# Patient Record
Sex: Female | Born: 1955
Health system: Southern US, Community
[De-identification: ages and names within clinical notes are randomized; demographics above are authoritative.]

## PROBLEM LIST (undated history)

## (undated) DIAGNOSIS — T7840XA Allergy, unspecified, initial encounter: Secondary | ICD-10-CM

## (undated) DIAGNOSIS — K219 Gastro-esophageal reflux disease without esophagitis: Secondary | ICD-10-CM

## (undated) DIAGNOSIS — I10 Essential (primary) hypertension: Secondary | ICD-10-CM

## (undated) DIAGNOSIS — G709 Myoneural disorder, unspecified: Secondary | ICD-10-CM

## (undated) DIAGNOSIS — N952 Postmenopausal atrophic vaginitis: Secondary | ICD-10-CM

## (undated) DIAGNOSIS — M1990 Unspecified osteoarthritis, unspecified site: Secondary | ICD-10-CM

## (undated) DIAGNOSIS — E7800 Pure hypercholesterolemia, unspecified: Secondary | ICD-10-CM

## (undated) HISTORY — DX: Myoneural disorder, unspecified: G70.9

## (undated) HISTORY — PX: COLONOSCOPY: SHX174

## (undated) HISTORY — DX: Pure hypercholesterolemia, unspecified: E78.00

## (undated) HISTORY — DX: Postmenopausal atrophic vaginitis: N95.2

## (undated) HISTORY — DX: Allergy, unspecified, initial encounter: T78.40XA

## (undated) HISTORY — DX: Gastro-esophageal reflux disease without esophagitis: K21.9

## (undated) HISTORY — DX: Unspecified osteoarthritis, unspecified site: M19.90

## (undated) HISTORY — PX: REFRACTIVE SURGERY: SHX103

## (undated) HISTORY — PX: TUBAL LIGATION: SHX77

## (undated) HISTORY — DX: Essential (primary) hypertension: I10

---

## 1990-05-29 HISTORY — PX: OVARIAN CYST SURGERY: SHX726

## 2001-04-12 ENCOUNTER — Other Ambulatory Visit: Admission: RE | Admit: 2001-04-12 | Discharge: 2001-04-12 | Payer: Self-pay | Admitting: Obstetrics and Gynecology

## 2002-04-01 ENCOUNTER — Encounter: Admission: RE | Admit: 2002-04-01 | Discharge: 2002-06-30 | Payer: Self-pay | Admitting: Family Medicine

## 2005-01-17 ENCOUNTER — Ambulatory Visit: Payer: Self-pay | Admitting: Family Medicine

## 2005-01-20 ENCOUNTER — Ambulatory Visit: Payer: Self-pay | Admitting: Family Medicine

## 2005-01-23 ENCOUNTER — Ambulatory Visit: Payer: Self-pay | Admitting: Family Medicine

## 2005-01-27 ENCOUNTER — Ambulatory Visit: Payer: Self-pay | Admitting: Family Medicine

## 2005-03-02 ENCOUNTER — Other Ambulatory Visit: Admission: RE | Admit: 2005-03-02 | Discharge: 2005-03-02 | Payer: Self-pay | Admitting: Obstetrics and Gynecology

## 2005-03-07 ENCOUNTER — Ambulatory Visit: Payer: Self-pay | Admitting: Family Medicine

## 2005-03-20 ENCOUNTER — Ambulatory Visit (HOSPITAL_COMMUNITY): Admission: RE | Admit: 2005-03-20 | Discharge: 2005-03-20 | Payer: Self-pay | Admitting: Obstetrics and Gynecology

## 2005-04-17 ENCOUNTER — Encounter: Admission: RE | Admit: 2005-04-17 | Discharge: 2005-04-17 | Payer: Self-pay | Admitting: Obstetrics and Gynecology

## 2005-04-18 ENCOUNTER — Ambulatory Visit: Payer: Self-pay | Admitting: Family Medicine

## 2005-06-17 ENCOUNTER — Encounter: Admission: RE | Admit: 2005-06-17 | Discharge: 2005-06-17 | Payer: Self-pay | Admitting: Obstetrics and Gynecology

## 2005-12-04 ENCOUNTER — Emergency Department (HOSPITAL_COMMUNITY): Admission: EM | Admit: 2005-12-04 | Discharge: 2005-12-04 | Payer: Self-pay | Admitting: Emergency Medicine

## 2005-12-05 ENCOUNTER — Encounter: Payer: Self-pay | Admitting: Vascular Surgery

## 2005-12-05 ENCOUNTER — Ambulatory Visit (HOSPITAL_COMMUNITY): Admission: RE | Admit: 2005-12-05 | Discharge: 2005-12-05 | Payer: Self-pay | Admitting: Emergency Medicine

## 2005-12-11 ENCOUNTER — Ambulatory Visit: Payer: Self-pay | Admitting: Family Medicine

## 2005-12-18 ENCOUNTER — Ambulatory Visit: Payer: Self-pay | Admitting: Family Medicine

## 2006-01-22 ENCOUNTER — Encounter: Admission: RE | Admit: 2006-01-22 | Discharge: 2006-01-22 | Payer: Self-pay | Admitting: Obstetrics and Gynecology

## 2006-01-26 ENCOUNTER — Ambulatory Visit: Payer: Self-pay | Admitting: Cardiology

## 2006-05-02 ENCOUNTER — Inpatient Hospital Stay: Payer: Self-pay | Admitting: Internal Medicine

## 2006-05-25 ENCOUNTER — Ambulatory Visit: Payer: Self-pay | Admitting: Family Medicine

## 2006-07-31 ENCOUNTER — Other Ambulatory Visit: Admission: RE | Admit: 2006-07-31 | Discharge: 2006-07-31 | Payer: Self-pay | Admitting: Obstetrics and Gynecology

## 2006-08-15 ENCOUNTER — Emergency Department (HOSPITAL_COMMUNITY): Admission: EM | Admit: 2006-08-15 | Discharge: 2006-08-15 | Payer: Self-pay | Admitting: Emergency Medicine

## 2006-08-23 ENCOUNTER — Ambulatory Visit: Payer: Self-pay | Admitting: Family Medicine

## 2006-08-31 ENCOUNTER — Encounter: Admission: RE | Admit: 2006-08-31 | Discharge: 2006-08-31 | Payer: Self-pay | Admitting: Obstetrics and Gynecology

## 2007-03-30 ENCOUNTER — Emergency Department: Payer: Self-pay | Admitting: Emergency Medicine

## 2008-03-14 ENCOUNTER — Emergency Department: Payer: Self-pay | Admitting: Unknown Physician Specialty

## 2008-03-28 ENCOUNTER — Emergency Department (HOSPITAL_COMMUNITY): Admission: EM | Admit: 2008-03-28 | Discharge: 2008-03-28 | Payer: Self-pay | Admitting: Family Medicine

## 2008-06-27 ENCOUNTER — Emergency Department: Payer: Self-pay | Admitting: Emergency Medicine

## 2008-07-13 ENCOUNTER — Emergency Department: Payer: Self-pay | Admitting: Emergency Medicine

## 2008-07-14 ENCOUNTER — Emergency Department: Payer: Self-pay | Admitting: Emergency Medicine

## 2008-08-13 ENCOUNTER — Encounter: Payer: Self-pay | Admitting: Obstetrics and Gynecology

## 2008-08-13 ENCOUNTER — Ambulatory Visit: Payer: Self-pay | Admitting: Obstetrics and Gynecology

## 2008-08-13 ENCOUNTER — Other Ambulatory Visit: Admission: RE | Admit: 2008-08-13 | Discharge: 2008-08-13 | Payer: Self-pay | Admitting: Obstetrics and Gynecology

## 2008-08-18 ENCOUNTER — Ambulatory Visit: Payer: Self-pay | Admitting: Obstetrics and Gynecology

## 2008-12-30 ENCOUNTER — Ambulatory Visit: Payer: Self-pay | Admitting: Obstetrics and Gynecology

## 2009-01-06 ENCOUNTER — Encounter: Admission: RE | Admit: 2009-01-06 | Discharge: 2009-01-06 | Payer: Self-pay | Admitting: Obstetrics and Gynecology

## 2009-09-02 ENCOUNTER — Ambulatory Visit: Payer: Self-pay | Admitting: Obstetrics and Gynecology

## 2009-09-02 ENCOUNTER — Other Ambulatory Visit: Admission: RE | Admit: 2009-09-02 | Discharge: 2009-09-02 | Payer: Self-pay | Admitting: Obstetrics and Gynecology

## 2010-02-04 ENCOUNTER — Encounter: Admission: RE | Admit: 2010-02-04 | Discharge: 2010-02-04 | Payer: Self-pay | Admitting: Obstetrics and Gynecology

## 2010-02-16 ENCOUNTER — Ambulatory Visit: Payer: Self-pay | Admitting: Obstetrics and Gynecology

## 2010-02-22 ENCOUNTER — Encounter: Admission: RE | Admit: 2010-02-22 | Discharge: 2010-02-22 | Payer: Self-pay | Admitting: Obstetrics and Gynecology

## 2010-06-18 ENCOUNTER — Encounter: Payer: Self-pay | Admitting: Obstetrics and Gynecology

## 2010-06-19 ENCOUNTER — Encounter: Payer: Self-pay | Admitting: Obstetrics and Gynecology

## 2010-09-09 ENCOUNTER — Ambulatory Visit (INDEPENDENT_AMBULATORY_CARE_PROVIDER_SITE_OTHER): Payer: Federal, State, Local not specified - PPO | Admitting: Obstetrics and Gynecology

## 2010-09-09 DIAGNOSIS — N952 Postmenopausal atrophic vaginitis: Secondary | ICD-10-CM

## 2010-09-09 DIAGNOSIS — B373 Candidiasis of vulva and vagina: Secondary | ICD-10-CM

## 2010-09-09 DIAGNOSIS — B3731 Acute candidiasis of vulva and vagina: Secondary | ICD-10-CM

## 2010-10-06 ENCOUNTER — Encounter: Payer: Federal, State, Local not specified - PPO | Admitting: Obstetrics and Gynecology

## 2010-10-14 NOTE — Progress Notes (Signed)
[facility]                          PERIPHERAL VASCULAR OFFICE NOTE   Sylvia, Kelly                         MRN:          [id number]  DATE:01/26/2006                            DOB:          03-22-1956    REFERRING PHYSICIAN:  Arta Silence, M.D./Billie D. Bean, FNP.   REASON FOR CONSULTATION:  Longstanding left leg edema.   HISTORY OF PRESENT ILLNESS:  Sylvia Kelly is a overweight 55 year old woman  with diabetes mellitus complicated by peripheral neuropathy.  She has had  longstanding intermittent edema of her left leg.  This dates back at least  to 2005.  Then she had an ultrasound interpreted by Dr. Aleen Campi to show no  evidence of deep venous thrombosis.  No comment was made on the venous  incompetence.  She had another ultrasound on July 10 of this year done at  [facility]  which again showed no evidence of venous thrombosis but  no comment was made on venous competence. She has been treated with Lasix  with marked improvement in her edema.  She has never had any swelling in her  right leg.  She has had longstanding varicosities primarily of her bilateral  thighs which appeared after giving birth to her son.   PAST MEDICAL HISTORY:  1. Status post ovarian cystectomy that turned out to be benign.  2. Diabetes mellitus, complicated by peripheral neuropathy.  3. History of depression.  4. Euthyroid goiter.  5. Hypercholesterolemia.   ALLERGIES:  None known.   CURRENT MEDICATIONS:  1. Actos 45 mg per day.  2. Lantus insulin 70 units per day.  3. Glucophage 1000 mg per day.  4. Calcium + D.  5. Lipitor 40 mg per day.  6. Zetia 10 mg per day.  7. Lasix 20 mg per day.  8. Multivitamin, Byetta, 10 mcg twice per day.  9. Humalog sliding scale.  10.Vitamin E.  11.Aspirin 81 mg per day.   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  She enjoys reading. She is widowed  She exercises by  walking 30  minutes at least five days per week.  She has never smoked and denies  alcohol and illicit drug use.  She follows a low-salt diet.   FAMILY HISTORY:  Father died of lung cancer at 32.  Mother died of  myocardial infarction at 79.  Her son was alive and well.   REVIEW OF SYSTEMS:  Remarkable for wearing reading glasses.  Went through  menopause at age 27.  Occasional cramps in the legs and some occasional knee  discomfort with exertion.  She tells me this is minor. Review of systems is  otherwise negative in detail except as above.   REVIEW OF SYSTEMS:  GENERAL:  Denies fever, chills, night sweats.  RESPIRATORY:  Has chronic cough, productive of white sputum.  GI:  Occasional nausea and GERD symptoms.  CARDIAC:  Denies chest pain, PND,  orthopnea, edema.  It is otherwise negative in detail except as above.   GENERAL:  She is generally a well-appearing,  overweight woman in no  distress.  VITAL SIGNS:  Heart rate 78, blood pressure 110/72 on the right and 112/68  on the left. She is 5 feet 4 inches tall and weighs 203 pounds.  BMI is  34.9.  HEENT:  Normal.  SKIN:  Normal with the exception of small venous varicosities primarily on  the thighs with some associated spiders.  NECK:  No jugular venous distension or lymphadenopathy.  She does have  prominent goiter without focal mass.  Respiratory effort is normal.  LUNGS:  Clear to auscultation.  CARDIOVASCULAR:  She has a nonpalpable point of maximal cardiac impulse.  Regular rate and rhythm without murmur, rub or gallop.  ABDOMEN:  Soft, nondistended, nontender.  There is no hepatosplenomegaly.  Bowel sounds are normal.  No midline pulsatile mass.  No abdominal bruit.  EXTREMITIES:  Warm without clubbing, cyanosis or edema, or ulceration.  PULSES:  Carotid pulses 2+ bilaterally without bruit.  Radial pulses are 2+  bilaterally.  Femoral pulses 2+ bilaterally without bruit.  The DP and PT  pulses 2+ bilaterally.   NEUROLOGIC: She was alert and oriented x3 with normal neurologic examination  with the exception of decreased sensation in her feet.   LABORATORY STUDIES:  Laboratory studies dated May 2007 remarkable for  creatinine 0.7, sodium 137, albumin 4.2, AST 20, ALT 20, LDL 72, HDL 81.  Hemoglobin A1C 7.6.  Additional laboratory studies dated December 2006  remarkable for TSH 0.6 and free T4 of 0.96 (both within normal limits).  Urinalysis showed no significant albuminuria.   IMPRESSION/RECOMMENDATION:  Sylvia Kelly has intermittent left leg edema which  is currently absent.  She thinks the Lasix has been helpful with this.  She  has no skin changes of chronic venous stasis.  Neither of her ultrasounds  have commented on venous incompetence.  I think this is the most likely  etiology.  We will, therefore, repeat an ultrasound specifically to look for  venous incompetence.  In the interim, would continue her on Lasix which  seems to be helping.  I have told her when her edema recurs, she should  purchase compression stockings  and wear these on a regular basis.  I applauded on her exercise program as  this will clearly be important over the long hall.                                   Salvadore Farber, MD   WED/MedQ  DD:  01/26/2006  DT:  01/27/2006  Job #:  [id number]

## 2010-10-26 ENCOUNTER — Encounter: Payer: Federal, State, Local not specified - PPO | Admitting: Obstetrics and Gynecology

## 2010-11-09 ENCOUNTER — Encounter (INDEPENDENT_AMBULATORY_CARE_PROVIDER_SITE_OTHER): Payer: Federal, State, Local not specified - PPO | Admitting: Obstetrics and Gynecology

## 2010-11-09 ENCOUNTER — Other Ambulatory Visit (HOSPITAL_COMMUNITY)
Admission: RE | Admit: 2010-11-09 | Discharge: 2010-11-09 | Disposition: A | Discharge status: Home / Self Care | Payer: Federal, State, Local not specified - PPO | Source: Ambulatory Visit | Attending: Obstetrics and Gynecology | Admitting: Obstetrics and Gynecology

## 2010-11-09 ENCOUNTER — Other Ambulatory Visit: Payer: Self-pay | Admitting: Obstetrics and Gynecology

## 2010-11-09 DIAGNOSIS — Z124 Encounter for screening for malignant neoplasm of cervix: Secondary | ICD-10-CM | POA: Insufficient documentation

## 2010-11-09 DIAGNOSIS — Z01419 Encounter for gynecological examination (general) (routine) without abnormal findings: Secondary | ICD-10-CM

## 2011-02-26 ENCOUNTER — Other Ambulatory Visit: Payer: Self-pay | Admitting: Obstetrics and Gynecology

## 2011-02-28 ENCOUNTER — Encounter: Payer: Self-pay | Admitting: Gynecology

## 2011-02-28 DIAGNOSIS — I10 Essential (primary) hypertension: Secondary | ICD-10-CM | POA: Insufficient documentation

## 2011-02-28 DIAGNOSIS — E7800 Pure hypercholesterolemia, unspecified: Secondary | ICD-10-CM | POA: Insufficient documentation

## 2011-02-28 DIAGNOSIS — E1140 Type 2 diabetes mellitus with diabetic neuropathy, unspecified: Secondary | ICD-10-CM | POA: Insufficient documentation

## 2011-02-28 DIAGNOSIS — N952 Postmenopausal atrophic vaginitis: Secondary | ICD-10-CM | POA: Insufficient documentation

## 2011-03-01 ENCOUNTER — Ambulatory Visit (INDEPENDENT_AMBULATORY_CARE_PROVIDER_SITE_OTHER): Payer: Federal, State, Local not specified - PPO | Admitting: Obstetrics and Gynecology

## 2011-03-01 DIAGNOSIS — N952 Postmenopausal atrophic vaginitis: Secondary | ICD-10-CM

## 2011-03-01 NOTE — Progress Notes (Signed)
Patient came to see me today regarding her vaginal ring. She is very happy with it. It works very well. The only issue is that she can't remove it and its time for to be removed.  Physical exam: The vaginal ring is properly placed. The patient has a small cystocele which I believe makes it hard for her to remove it.  Assessment: Atrophic vaginitis  Plan: Vaginal ring removed with ease. Patient will insert another one herself. She will look for a diaphragm inserter at the drug store. If she cannot find 1 she'll return in 3 months for me to remove her ring again.

## 2011-06-20 ENCOUNTER — Ambulatory Visit (INDEPENDENT_AMBULATORY_CARE_PROVIDER_SITE_OTHER): Payer: Federal, State, Local not specified - PPO | Admitting: Obstetrics and Gynecology

## 2011-06-20 DIAGNOSIS — N952 Postmenopausal atrophic vaginitis: Secondary | ICD-10-CM

## 2011-06-20 MED ORDER — ESTRADIOL 2 MG VA RING: 2.0000 mg | VAGINAL_RING | VAGINAL | Status: DC

## 2011-06-20 NOTE — Progress Notes (Signed)
Patient came to see me today because she cannot get her Estring vaginal ring out. She is very happy with it and this is her second one. She is due for a mammogram.  Exam: Kennon Portela present. Estring vaginal ring removed.  Assessment: Atrophic vaginitis  Plan: Patient instructed how to remove it. She has no refills so we called them in. Patient to schedule mammogram. Return in June for yearly visit.

## 2011-06-26 ENCOUNTER — Other Ambulatory Visit: Payer: Self-pay | Admitting: Obstetrics and Gynecology

## 2011-06-26 DIAGNOSIS — Z1231 Encounter for screening mammogram for malignant neoplasm of breast: Secondary | ICD-10-CM

## 2011-07-06 ENCOUNTER — Ambulatory Visit
Admission: RE | Admit: 2011-07-06 | Discharge: 2011-07-06 | Disposition: A | Discharge status: Home / Self Care | Payer: Federal, State, Local not specified - PPO | Source: Ambulatory Visit | Attending: Obstetrics and Gynecology | Admitting: Obstetrics and Gynecology

## 2011-07-06 DIAGNOSIS — Z1231 Encounter for screening mammogram for malignant neoplasm of breast: Secondary | ICD-10-CM

## 2011-10-17 ENCOUNTER — Ambulatory Visit: Payer: Federal, State, Local not specified - PPO | Admitting: Obstetrics and Gynecology

## 2011-10-17 ENCOUNTER — Ambulatory Visit (INDEPENDENT_AMBULATORY_CARE_PROVIDER_SITE_OTHER): Payer: Federal, State, Local not specified - PPO | Admitting: Obstetrics and Gynecology

## 2011-10-17 DIAGNOSIS — N952 Postmenopausal atrophic vaginitis: Secondary | ICD-10-CM

## 2011-10-17 NOTE — Progress Notes (Signed)
Patient came to see me today because she cannot remove her Estring. She is very happy with that and it has terminated the dryness with intercourse. She has a new ring at home and she has no trouble placing it.   Exam: Kennon Portela present. Estring removed without difficulty. Patient has excellent estrogen effect.

## 2011-11-10 ENCOUNTER — Encounter: Payer: Self-pay | Admitting: Obstetrics and Gynecology

## 2011-11-10 ENCOUNTER — Ambulatory Visit (INDEPENDENT_AMBULATORY_CARE_PROVIDER_SITE_OTHER): Payer: Federal, State, Local not specified - PPO | Admitting: Obstetrics and Gynecology

## 2011-11-10 VITALS — BP 130/78 | Ht 64.0 in | Wt 189.0 lb

## 2011-11-10 DIAGNOSIS — N899 Noninflammatory disorder of vagina, unspecified: Secondary | ICD-10-CM

## 2011-11-10 DIAGNOSIS — N898 Other specified noninflammatory disorders of vagina: Secondary | ICD-10-CM

## 2011-11-10 DIAGNOSIS — Z01419 Encounter for gynecological examination (general) (routine) without abnormal findings: Secondary | ICD-10-CM

## 2011-11-10 LAB — WET PREP FOR TRICH, YEAST, CLUE
Trich, Wet Prep: NONE SEEN
WBC, Wet Prep HPF POC: NONE SEEN

## 2011-11-10 MED ORDER — TERCONAZOLE 0.8 % VA CREA: 1.0000 | TOPICAL_CREAM | Freq: Every day | VAGINAL | Status: AC

## 2011-11-10 MED ORDER — ESTRADIOL 2 MG VA RING: 2.0000 mg | VAGINAL_RING | VAGINAL | Status: DC

## 2011-11-10 NOTE — Progress Notes (Signed)
Patient came to see me today for her annual GYN exam. She does well without HRT. She does use  Estring for atrophic vaginitis with excellent results. She is having no vaginal bleeding. She is having no pelvic pain. She has a 2 day history of vulvar and vaginal irritation and discharge. She's been treated for diabetes which based on what she said to me does not appear to be well-controlled. She is up-to-date on mammograms. She has had 2 normal bone densities. She has always had normal Pap smears and her last 1 was 2012. She has noticed some swelling in her left leg. She is on blood pressure medication which includes a diuretic.  Physical examination: Kennon Portela present. HEENT within normal limits. Neck: Thyroid not large. No masses. Supraclavicular nodes: not enlarged. Breasts: Examined in both sitting and lying  position. No skin changes and no masses. Abdomen: Soft no guarding rebound or masses or hernia. Pelvic: External: Within normal limits. BUS: Within normal limits. Vaginal:within normal limits.  Wet prep positive for yeast. Good estrogen effect. No evidence of cystocele rectocele or enterocele. Cervix: clean. Uterus: Normal size and shape. Adnexa: No masses. Rectovaginal exam: Confirmatory and negative. Extremities: Within normal limits except for 2+ edema of left foot.  Assessment: #1. Atrophic vaginitis #2. Yeast vaginitis #3. Unilateral swelling of the leg  Plan: Continue yearly mammograms. Continue estring. Terconazole 3 cream. Due to her antihypertensive drug I have suggested she discuss her leg swelling with her PCP. She will also try to control her diabetes better.

## 2011-11-11 LAB — URINALYSIS W MICROSCOPIC + REFLEX CULTURE
Bacteria, UA: NONE SEEN
Bilirubin Urine: NEGATIVE
Casts: NONE SEEN
Crystals: NONE SEEN
Glucose, UA: 100 mg/dL — AB
Hgb urine dipstick: NEGATIVE
Ketones, ur: NEGATIVE mg/dL
Leukocytes, UA: NEGATIVE
Nitrite: NEGATIVE
Protein, ur: NEGATIVE mg/dL
Specific Gravity, Urine: 1.012 (ref 1.005–1.030)
Squamous Epithelial / HPF: NONE SEEN
Urobilinogen, UA: 0.2 mg/dL (ref 0.0–1.0)
pH: 6.5 (ref 5.0–8.0)

## 2011-11-17 ENCOUNTER — Encounter: Payer: Self-pay | Admitting: Obstetrics and Gynecology

## 2012-04-04 ENCOUNTER — Ambulatory Visit (INDEPENDENT_AMBULATORY_CARE_PROVIDER_SITE_OTHER): Payer: Federal, State, Local not specified - PPO | Admitting: Gynecology

## 2012-04-04 ENCOUNTER — Encounter: Payer: Self-pay | Admitting: Gynecology

## 2012-04-04 DIAGNOSIS — N952 Postmenopausal atrophic vaginitis: Secondary | ICD-10-CM

## 2012-04-04 DIAGNOSIS — Z23 Encounter for immunization: Secondary | ICD-10-CM

## 2012-04-04 NOTE — Progress Notes (Signed)
Patient presents to have her Estring removed. Is being treated for atrophic vaginitis but Dr. Eda Paschal doing well with this. She cannot remove it herself but is able to replace it.  Exam was kim assistant External BUS vagina grossly normal. Estring removed and discarded. Cervix normal. Uterus grossly normal midline mobile nontender. Adnexa without masses or tenderness.  Assessment and plan: Atrophic vaginitis with good response to Estring. She'll continue in follow up in June 2014 when she is due for her annual. No bleeding or other complaints. She was again reminded to report any vaginal bleeding.

## 2012-04-04 NOTE — Patient Instructions (Signed)
Replace Estring Follow up June 2014 for annual exam

## 2012-04-04 NOTE — Addendum Note (Signed)
Addended by: Dayna Barker on: 04/04/2012 10:51 AM   Modules accepted: Orders

## 2012-07-29 ENCOUNTER — Other Ambulatory Visit: Payer: Self-pay

## 2012-07-29 MED ORDER — ESTRADIOL 2 MG VA RING: 2.0000 mg | VAGINAL_RING | VAGINAL | Status: DC

## 2012-09-24 ENCOUNTER — Ambulatory Visit (INDEPENDENT_AMBULATORY_CARE_PROVIDER_SITE_OTHER): Payer: Federal, State, Local not specified - PPO | Admitting: Gynecology

## 2012-09-24 ENCOUNTER — Encounter: Payer: Self-pay | Admitting: Gynecology

## 2012-09-24 DIAGNOSIS — N952 Postmenopausal atrophic vaginitis: Secondary | ICD-10-CM

## 2012-09-24 MED ORDER — OSPEMIFENE 60 MG PO TABS: 60.0000 mg | ORAL_TABLET | Freq: Every day | ORAL | Status: DC

## 2012-09-24 NOTE — Progress Notes (Signed)
Patient presents to have her Estring removed. She can replace it herself but has difficulty removing it. She had questions about Osphena.  Exam with Selena Batten assistant External BUS vagina normal with mild atrophic changes. Cervix normal. Uterus normal size midline mobile nontender. Adnexa without masses or tenderness.  Assessment and plan: Atrophic vaginitis. Patient is symptomatic with vaginal dryness and dyspareunia. No significant other menopausal symptoms such as hot flushes or night sweats. Options for management include OTC Rx, oral or transdermal HRT, estrogen vaginal cream, Vagifem, continuing Estring or Osphena. I reviewed the weight Osphena works in the issues to include bone breast coagulation endometrial stimulation issues. Relatively new drug and unknown long-term risks reviewed. Patient wants to try it I gave her 2 week sample and the starter kit with discount card. Patient will try and if she likes this will continue. She says she due to see me a month for her annual we'll rediscuss at that time. If she wants to go back on Estring she'll call we can refill this also.

## 2012-09-24 NOTE — Patient Instructions (Signed)
Try Osphena as we discussed. Call if any questions. Followup for your annual exam as scheduled.

## 2012-11-11 ENCOUNTER — Encounter: Payer: Federal, State, Local not specified - PPO | Admitting: Gynecology

## 2012-12-05 ENCOUNTER — Encounter: Payer: Self-pay | Admitting: Gynecology

## 2012-12-25 ENCOUNTER — Encounter: Payer: Federal, State, Local not specified - PPO | Admitting: Gynecology

## 2013-02-13 ENCOUNTER — Encounter: Payer: Federal, State, Local not specified - PPO | Admitting: Gynecology

## 2013-06-06 ENCOUNTER — Other Ambulatory Visit: Payer: Self-pay | Admitting: Nurse Practitioner

## 2013-06-06 DIAGNOSIS — R519 Headache, unspecified: Secondary | ICD-10-CM

## 2013-06-06 DIAGNOSIS — R51 Headache: Principal | ICD-10-CM

## 2013-06-18 ENCOUNTER — Ambulatory Visit
Admission: RE | Admit: 2013-06-18 | Discharge: 2013-06-18 | Disposition: A | Discharge status: Home / Self Care | Payer: Federal, State, Local not specified - PPO | Source: Ambulatory Visit | Attending: Nurse Practitioner | Admitting: Nurse Practitioner

## 2013-06-18 DIAGNOSIS — R519 Headache, unspecified: Secondary | ICD-10-CM

## 2013-06-18 DIAGNOSIS — R51 Headache: Principal | ICD-10-CM

## 2013-06-21 ENCOUNTER — Ambulatory Visit
Admission: RE | Admit: 2013-06-21 | Discharge: 2013-06-21 | Disposition: A | Discharge status: Home / Self Care | Payer: Federal, State, Local not specified - PPO | Source: Ambulatory Visit | Attending: Nurse Practitioner | Admitting: Nurse Practitioner

## 2013-06-21 MED ORDER — GADOBENATE DIMEGLUMINE 529 MG/ML IV SOLN
16.0000 mL | Freq: Once | INTRAVENOUS | Status: AC | PRN
  Administered 2013-06-21: 16 mL via INTRAVENOUS

## 2013-06-23 ENCOUNTER — Other Ambulatory Visit: Payer: Federal, State, Local not specified - PPO

## 2013-09-09 ENCOUNTER — Other Ambulatory Visit: Payer: Self-pay

## 2013-09-09 DIAGNOSIS — Z1231 Encounter for screening mammogram for malignant neoplasm of breast: Secondary | ICD-10-CM

## 2013-09-26 ENCOUNTER — Encounter (INDEPENDENT_AMBULATORY_CARE_PROVIDER_SITE_OTHER): Payer: Self-pay

## 2013-09-26 ENCOUNTER — Ambulatory Visit
Admission: RE | Admit: 2013-09-26 | Discharge: 2013-09-26 | Disposition: A | Discharge status: Home / Self Care | Payer: Federal, State, Local not specified - PPO | Source: Ambulatory Visit

## 2013-09-26 DIAGNOSIS — Z1231 Encounter for screening mammogram for malignant neoplasm of breast: Secondary | ICD-10-CM

## 2013-09-29 ENCOUNTER — Other Ambulatory Visit: Payer: Self-pay | Admitting: Internal Medicine

## 2013-09-29 DIAGNOSIS — R928 Other abnormal and inconclusive findings on diagnostic imaging of breast: Secondary | ICD-10-CM

## 2013-10-08 ENCOUNTER — Ambulatory Visit
Admission: RE | Admit: 2013-10-08 | Discharge: 2013-10-08 | Disposition: A | Discharge status: Home / Self Care | Payer: Federal, State, Local not specified - PPO | Source: Ambulatory Visit | Attending: Internal Medicine | Admitting: Internal Medicine

## 2013-10-08 DIAGNOSIS — R928 Other abnormal and inconclusive findings on diagnostic imaging of breast: Secondary | ICD-10-CM

## 2014-03-12 ENCOUNTER — Other Ambulatory Visit: Payer: Self-pay | Admitting: Internal Medicine

## 2014-03-12 DIAGNOSIS — R921 Mammographic calcification found on diagnostic imaging of breast: Secondary | ICD-10-CM

## 2014-03-30 ENCOUNTER — Encounter: Payer: Self-pay | Admitting: Gynecology

## 2014-04-14 ENCOUNTER — Ambulatory Visit
Admission: RE | Admit: 2014-04-14 | Discharge: 2014-04-14 | Disposition: A | Discharge status: Home / Self Care | Payer: Federal, State, Local not specified - PPO | Source: Ambulatory Visit | Attending: Internal Medicine | Admitting: Internal Medicine

## 2014-04-14 DIAGNOSIS — R921 Mammographic calcification found on diagnostic imaging of breast: Secondary | ICD-10-CM

## 2014-08-17 ENCOUNTER — Encounter: Payer: Federal, State, Local not specified - PPO | Admitting: Gynecology

## 2014-09-09 ENCOUNTER — Other Ambulatory Visit: Payer: Self-pay | Admitting: Internal Medicine

## 2014-09-09 DIAGNOSIS — R921 Mammographic calcification found on diagnostic imaging of breast: Secondary | ICD-10-CM

## 2015-04-10 ENCOUNTER — Encounter (HOSPITAL_COMMUNITY): Payer: Self-pay | Admitting: *Deleted

## 2015-04-10 ENCOUNTER — Emergency Department (HOSPITAL_COMMUNITY)
Admission: EM | Admit: 2015-04-10 | Discharge: 2015-04-10 | Disposition: A | Discharge status: Home / Self Care | Payer: Federal, State, Local not specified - PPO | Attending: Emergency Medicine | Admitting: Emergency Medicine

## 2015-04-10 DIAGNOSIS — S199XXA Unspecified injury of neck, initial encounter: Secondary | ICD-10-CM | POA: Diagnosis present

## 2015-04-10 DIAGNOSIS — Y92410 Unspecified street and highway as the place of occurrence of the external cause: Secondary | ICD-10-CM | POA: Insufficient documentation

## 2015-04-10 DIAGNOSIS — E119 Type 2 diabetes mellitus without complications: Secondary | ICD-10-CM | POA: Diagnosis not present

## 2015-04-10 DIAGNOSIS — Z7982 Long term (current) use of aspirin: Secondary | ICD-10-CM | POA: Insufficient documentation

## 2015-04-10 DIAGNOSIS — Y9389 Activity, other specified: Secondary | ICD-10-CM | POA: Diagnosis not present

## 2015-04-10 DIAGNOSIS — E7800 Pure hypercholesterolemia, unspecified: Secondary | ICD-10-CM | POA: Insufficient documentation

## 2015-04-10 DIAGNOSIS — Y998 Other external cause status: Secondary | ICD-10-CM | POA: Insufficient documentation

## 2015-04-10 DIAGNOSIS — Z8742 Personal history of other diseases of the female genital tract: Secondary | ICD-10-CM | POA: Diagnosis not present

## 2015-04-10 DIAGNOSIS — S134XXA Sprain of ligaments of cervical spine, initial encounter: Secondary | ICD-10-CM

## 2015-04-10 DIAGNOSIS — Z79899 Other long term (current) drug therapy: Secondary | ICD-10-CM | POA: Insufficient documentation

## 2015-04-10 DIAGNOSIS — Z794 Long term (current) use of insulin: Secondary | ICD-10-CM | POA: Diagnosis not present

## 2015-04-10 DIAGNOSIS — I10 Essential (primary) hypertension: Secondary | ICD-10-CM | POA: Insufficient documentation

## 2015-04-10 MED ORDER — METHOCARBAMOL 500 MG PO TABS: 500.0000 mg | ORAL_TABLET | Freq: Two times a day (BID) | ORAL | Status: DC

## 2015-04-10 MED ORDER — IBUPROFEN 800 MG PO TABS: 800.0000 mg | ORAL_TABLET | Freq: Three times a day (TID) | ORAL | Status: DC
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
## 2015-04-10 NOTE — Discharge Instructions (Signed)
°Cervical Strain and Sprain With Rehab °Cervical strain and sprain are injuries that commonly occur with "whiplash" injuries. Whiplash occurs when the neck is forcefully whipped backward or forward, such as during a motor vehicle accident or during contact sports. The muscles, ligaments, tendons, discs, and nerves of the neck are susceptible to injury when this occurs. °RISK FACTORS °Risk of having a whiplash injury increases if: °· Osteoarthritis of the spine. °· Situations that make head or neck accidents or trauma more likely. °· High-risk sports (football, rugby, wrestling, hockey, auto racing, gymnastics, diving, contact karate, or boxing). °· Poor strength and flexibility of the neck. °· Previous neck injury. °· Poor tackling technique. °· Improperly fitted or padded equipment. °SYMPTOMS  °· Pain or stiffness in the front or back of neck or both. °· Symptoms may present immediately or up to 24 hours after injury. °· Dizziness, headache, nausea, and vomiting. °· Muscle spasm with soreness and stiffness in the neck. °· Tenderness and swelling at the injury site. °PREVENTION °· Learn and use proper technique (avoid tackling with the head, spearing, and head-butting; use proper falling techniques to avoid landing on the head). °· Warm up and stretch properly before activity. °· Maintain physical fitness: °¨ Strength, flexibility, and endurance. °¨ Cardiovascular fitness. °· Wear properly fitted and padded protective equipment, such as padded soft collars, for participation in contact sports. °PROGNOSIS  °Recovery from cervical strain and sprain injuries is dependent on the extent of the injury. These injuries are usually curable in 1 week to 3 months with appropriate treatment.  °RELATED COMPLICATIONS  °· Temporary numbness and weakness may occur if the nerve roots are damaged, and this may persist until the nerve has completely healed. °· Chronic pain due to frequent recurrence of symptoms. °· Prolonged healing,  especially if activity is resumed too soon (before complete recovery). °TREATMENT  °Treatment initially involves the use of ice and medication to help reduce pain and inflammation. It is also important to perform strengthening and stretching exercises and modify activities that worsen symptoms so the injury does not get worse. These exercises may be performed at home or with a therapist. For patients who experience severe symptoms, a soft, padded collar may be recommended to be worn around the neck.  °Improving your posture may help reduce symptoms. Posture improvement includes pulling your chin and abdomen in while sitting or standing. If you are sitting, sit in a firm chair with your buttocks against the back of the chair. While sleeping, try replacing your pillow with a small towel rolled to 2 inches in diameter, or use a cervical pillow or soft cervical collar. Poor sleeping positions delay healing.  °For patients with nerve root damage, which causes numbness or weakness, the use of a cervical traction apparatus may be recommended. Surgery is rarely necessary for these injuries. However, cervical strain and sprains that are present at birth (congenital) may require surgery. °MEDICATION  °· If pain medication is necessary, nonsteroidal anti-inflammatory medications, such as aspirin and ibuprofen, or other minor pain relievers, such as acetaminophen, are often recommended. °· Do not take pain medication for 7 days before surgery. °· Prescription pain relievers may be given if deemed necessary by your caregiver. Use only as directed and only as much as you need. °HEAT AND COLD:  °· Cold treatment (icing) relieves pain and reduces inflammation. Cold treatment should be applied for 10 to 15 minutes every 2 to 3 hours for inflammation and pain and immediately after any activity that aggravates your   HEAT AND COLD:   · Cold treatment (icing) relieves pain and reduces inflammation. Cold treatment should be applied for 10 to 15 minutes every 2 to 3 hours for inflammation and pain and immediately after any activity that aggravates your symptoms. Use ice packs or an ice massage.  · Heat treatment may be used prior to performing the stretching and  strengthening activities prescribed by your caregiver, physical therapist, or athletic trainer. Use a heat pack or a warm soak.  SEEK MEDICAL CARE IF:   · Symptoms get worse or do not improve in 2 weeks despite treatment.  · New, unexplained symptoms develop (drugs used in treatment may produce side effects).  EXERCISES  RANGE OF MOTION (ROM) AND STRETCHING EXERCISES - Cervical Strain and Sprain  These exercises may help you when beginning to rehabilitate your injury. In order to successfully resolve your symptoms, you must improve your posture. These exercises are designed to help reduce the forward-head and rounded-shoulder posture which contributes to this condition. Your symptoms may resolve with or without further involvement from your physician, physical therapist or athletic trainer. While completing these exercises, remember:   · Restoring tissue flexibility helps normal motion to return to the joints. This allows healthier, less painful movement and activity.  · An effective stretch should be held for at least 20 seconds, although you may need to begin with shorter hold times for comfort.  · A stretch should never be painful. You should only feel a gentle lengthening or release in the stretched tissue.  STRETCH- Axial Extensors  · Lie on your back on the floor. You may bend your knees for comfort. Place a rolled-up hand towel or dish towel, about 2 inches in diameter, under the part of your head that makes contact with the floor.  · Gently tuck your chin, as if trying to make a "double chin," until you feel a gentle stretch at the base of your head.  · Hold __________ seconds.  Repeat __________ times. Complete this exercise __________ times per day.   STRETCH - Axial Extension   · Stand or sit on a firm surface. Assume a good posture: chest up, shoulders drawn back, abdominal muscles slightly tense, knees unlocked (if standing) and feet hip width apart.  · Slowly retract your chin so your head slides back  and your chin slightly lowers. Continue to look straight ahead.  · You should feel a gentle stretch in the back of your head. Be certain not to feel an aggressive stretch since this can cause headaches later.  · Hold for __________ seconds.  Repeat __________ times. Complete this exercise __________ times per day.  STRETCH - Cervical Side Bend   · Stand or sit on a firm surface. Assume a good posture: chest up, shoulders drawn back, abdominal muscles slightly tense, knees unlocked (if standing) and feet hip width apart.  · Without letting your nose or shoulders move, slowly tip your right / left ear to your shoulder until your feel a gentle stretch in the muscles on the opposite side of your neck.  · Hold __________ seconds.  Repeat __________ times. Complete this exercise __________ times per day.  STRETCH - Cervical Rotators   · Stand or sit on a firm surface. Assume a good posture: chest up, shoulders drawn back, abdominal muscles slightly tense, knees unlocked (if standing) and feet hip width apart.  · Keeping your eyes level with the ground, slowly turn your head until you feel a gentle stretch along   the back and opposite side of your neck.  · Hold __________ seconds.  Repeat __________ times. Complete this exercise __________ times per day.  RANGE OF MOTION - Neck Circles   · Stand or sit on a firm surface. Assume a good posture: chest up, shoulders drawn back, abdominal muscles slightly tense, knees unlocked (if standing) and feet hip width apart.  · Gently roll your head down and around from the back of one shoulder to the back of the other. The motion should never be forced or painful.  · Repeat the motion 10-20 times, or until you feel the neck muscles relax and loosen.  Repeat __________ times. Complete the exercise __________ times per day.  STRENGTHENING EXERCISES - Cervical Strain and Sprain  These exercises may help you when beginning to rehabilitate your injury. They may resolve your symptoms with or  without further involvement from your physician, physical therapist, or athletic trainer. While completing these exercises, remember:   · Muscles can gain both the endurance and the strength needed for everyday activities through controlled exercises.  · Complete these exercises as instructed by your physician, physical therapist, or athletic trainer. Progress the resistance and repetitions only as guided.  · You may experience muscle soreness or fatigue, but the pain or discomfort you are trying to eliminate should never worsen during these exercises. If this pain does worsen, stop and make certain you are following the directions exactly. If the pain is still present after adjustments, discontinue the exercise until you can discuss the trouble with your clinician.  STRENGTH - Cervical Flexors, Isometric  · Face a wall, standing about 6 inches away. Place a small pillow, a ball about 6-8 inches in diameter, or a folded towel between your forehead and the wall.  · Slightly tuck your chin and gently push your forehead into the soft object. Push only with mild to moderate intensity, building up tension gradually. Keep your jaw and forehead relaxed.  · Hold 10 to 20 seconds. Keep your breathing relaxed.  · Release the tension slowly. Relax your neck muscles completely before you start the next repetition.  Repeat __________ times. Complete this exercise __________ times per day.  STRENGTH- Cervical Lateral Flexors, Isometric   · Stand about 6 inches away from a wall. Place a small pillow, a ball about 6-8 inches in diameter, or a folded towel between the side of your head and the wall.  · Slightly tuck your chin and gently tilt your head into the soft object. Push only with mild to moderate intensity, building up tension gradually. Keep your jaw and forehead relaxed.  · Hold 10 to 20 seconds. Keep your breathing relaxed.  · Release the tension slowly. Relax your neck muscles completely before you start the next  repetition.  Repeat __________ times. Complete this exercise __________ times per day.  STRENGTH - Cervical Extensors, Isometric   · Stand about 6 inches away from a wall. Place a small pillow, a ball about 6-8 inches in diameter, or a folded towel between the back of your head and the wall.  · Slightly tuck your chin and gently tilt your head back into the soft object. Push only with mild to moderate intensity, building up tension gradually. Keep your jaw and forehead relaxed.  · Hold 10 to 20 seconds. Keep your breathing relaxed.  · Release the tension slowly. Relax your neck muscles completely before you start the next repetition.  Repeat __________ times. Complete this exercise __________ times per day.    All of your joints have less wear and tear when properly supported by a spine with good posture. This means you will experience a healthier, less painful body.  Correct posture must be practiced with all of your activities, especially prolonged sitting and standing. Correct posture is as important when doing repetitive low-stress activities (typing) as it is when doing a single heavy-load activity (lifting). PROLONGED STANDING WHILE SLIGHTLY LEANING FORWARD When completing a task that requires you to lean forward while standing in one  place for a long time, place either foot up on a stationary 2- to 4-inch high object to help maintain the best posture. When both feet are on the ground, the low back tends to lose its slight inward curve. If this curve flattens (or becomes too large), then the back and your other joints will experience too much stress, fatigue more quickly, and can cause pain.  RESTING POSITIONS Consider which positions are most painful for you when choosing a resting position. If you have pain with flexion-based activities (sitting, bending, stooping, squatting), choose a position that allows you to rest in a less flexed posture. You would want to avoid curling into a fetal position on your side. If your pain worsens with extension-based activities (prolonged standing, working overhead), avoid resting in an extended position such as sleeping on your stomach. Most people will find more comfort when they rest with their spine in a more neutral position, neither too rounded nor too arched. Lying on a non-sagging bed on your side with a pillow between your knees, or on your back with a pillow under your knees will often provide some relief. Keep in mind, being in any one position for a prolonged period of time, no matter how correct your posture, can still lead to stiffness. WALKING Walk with an upright posture. Your ears, shoulders, and hips should all line up. OFFICE WORK When working at a desk, create an environment that supports good, upright posture. Without extra support, muscles fatigue and lead to excessive strain on joints and other tissues. CHAIR:  A chair should be able to slide under your desk when your back makes contact with the back of the chair. This allows you to work closely.  The chair's height should allow your eyes to be level with the upper part of your monitor and your hands to be slightly lower than your elbows.  Body position:  Your feet should make contact with the floor. If this is not  possible, use a foot rest.  Keep your ears over your shoulders. This will reduce stress on your neck and low back.   This information is not intended to replace advice given to you by your health care provider. Make sure you discuss any questions you have with your health care provider.   Document Released: 05/15/2005 Document Revised: 06/05/2014 Document Reviewed: 08/27/2008 Elsevier Interactive Patient Education 2016 [employer].  Technical brewer After a car crash (motor vehicle collision), it is normal to have bruises and sore muscles. The first 24 hours usually feel the worst. After that, you will likely start to feel better each day. HOME CARE  Put ice on the injured area.  Put ice in a plastic bag.  Place a towel between your skin and the bag.  Leave the ice on for 15-20 minutes, 03-04 times a day.  Drink enough fluids to keep your pee (urine) clear or pale yellow.  Do not drink alcohol.  Take a warm shower or bath 1 or  2 times a day. This helps your sore muscles.  Return to activities as told by your doctor. Be careful when lifting. Lifting can make neck or back pain worse.  Only take medicine as told by your doctor. Do not use aspirin. GET HELP RIGHT AWAY IF:   Your arms or legs tingle, feel weak, or lose feeling (numbness).  You have headaches that do not get better with medicine.  You have neck pain, especially in the middle of the back of your neck.  You cannot control when you pee (urinate) or poop (bowel movement).  Pain is getting worse in any part of your body.  You are short of breath, dizzy, or pass out (faint).  You have chest pain.  You feel sick to your stomach (nauseous), throw up (vomit), or sweat.  You have belly (abdominal) pain that gets worse.  There is blood in your pee, poop, or throw up.  You have pain in your shoulder (shoulder strap areas).  Your problems are getting worse. MAKE SURE YOU:   Understand these  instructions.  Will watch your condition.  Will get help right away if you are not doing well or get worse.   This information is not intended to replace advice given to you by your health care provider. Make sure you discuss any questions you have with your health care provider.   Document Released: 11/01/2007 Document Revised: 08/07/2011 Document Reviewed: 10/12/2010 Elsevier Interactive Patient Education [employer].

## 2015-08-17 ENCOUNTER — Ambulatory Visit
Admission: RE | Admit: 2015-08-17 | Discharge: 2015-08-17 | Disposition: A | Discharge status: Home / Self Care | Payer: Federal, State, Local not specified - PPO | Source: Ambulatory Visit | Attending: Internal Medicine | Admitting: Internal Medicine

## 2015-08-17 DIAGNOSIS — R921 Mammographic calcification found on diagnostic imaging of breast: Secondary | ICD-10-CM

## 2015-09-07 DIAGNOSIS — E119 Type 2 diabetes mellitus without complications: Secondary | ICD-10-CM | POA: Diagnosis not present

## 2015-09-07 DIAGNOSIS — H524 Presbyopia: Secondary | ICD-10-CM | POA: Diagnosis not present

## 2015-09-28 ENCOUNTER — Encounter: Payer: Federal, State, Local not specified - PPO | Attending: Internal Medicine | Admitting: Skilled Nursing Facility1

## 2015-09-28 ENCOUNTER — Encounter: Payer: Self-pay | Admitting: Skilled Nursing Facility1

## 2015-09-28 VITALS — Ht 63.0 in | Wt 167.0 lb

## 2015-09-28 DIAGNOSIS — E119 Type 2 diabetes mellitus without complications: Secondary | ICD-10-CM

## 2015-09-28 NOTE — Progress Notes (Signed)
Diabetes Self-Management Education  Visit Type: First/Initial  Appt. Start Time: 4:30 Appt. End Time: 6:00  09/28/2015  [name], identified by name and date of birth, is a 60 y.o. female with a diagnosis of Diabetes: Type 2.   ASSESSMENT  Height 5\' 3"  (1.6 m), weight 167 lb (75.751 kg). Body mass index is 29.59 kg/(m^2).      Diabetes Self-Management Education - 09/28/15 1626    Visit Information   Visit Type First/Initial   Initial Visit   Diabetes Type Type 2   Are you currently following a meal plan? No   Are you taking your medications as prescribed? Yes   Date Diagnosed 25 years ago   Health Coping   How would you rate your overall health? Fair   Psychosocial Assessment   Patient Belief/Attitude about Diabetes Motivated to manage diabetes   Self-care barriers None   Self-management support Family   Patient Concerns Nutrition/Meal planning   Special Needs None   Pre-Education Assessment   Patient understands the diabetes disease and treatment process. Needs Instruction   Patient understands incorporating nutritional management into lifestyle. Needs Review   Patient undertands incorporating physical activity into lifestyle. Needs Instruction   Patient understands using medications safely. Needs Review   Patient understands monitoring blood glucose, interpreting and using results Needs Instruction   Patient understands prevention, detection, and treatment of acute complications. Needs Instruction   Patient understands prevention, detection, and treatment of chronic complications. Needs Instruction   Patient understands how to develop strategies to address psychosocial issues. Needs Instruction   Patient understands how to develop strategies to promote health/change behavior. Needs Instruction   Complications   Last HgB A1C per patient/outside source 8.9 %   How often do you check your blood sugar? 1-2 times/day   Fasting Blood glucose range (mg/dL) 130-179   Number of hypoglycemic episodes per month 1  eats fruit   Can you tell when your blood sugar is low? Yes   Have you had a dilated eye exam in the past 12 months? Yes   Have you had a dental exam in the past 12 months? Yes   Are you checking your feet? Yes   How many days per week are you checking your feet? 7   Dietary Intake   Breakfast oatmeal with fruit   Lunch fast food-----side salad and frozen lasagna   Dinner chicken, corn and green beans----liver   Beverage(s) coffee, water, soda   Exercise   Exercise Type ADL's   Patient Education   Previous Diabetes Education Yes (please comment)  20 years ago-little can be recalled   Disease state  Definition of diabetes, type 1 and 2, and the diagnosis of diabetes;Factors that contribute to the development of diabetes   Nutrition management  Role of diet in the treatment of diabetes and the relationship between the three main macronutrients and blood glucose level;Food label reading, portion sizes and measuring food.;Carbohydrate counting;Reviewed blood glucose goals for pre and post meals and how to evaluate the patients' food intake on their blood glucose level.;Information on hints to eating out and maintain blood glucose control.   Physical activity and exercise  Role of exercise on diabetes management, blood pressure control and cardiac health.   Monitoring Daily foot exams;Yearly dilated eye exam;Purpose and frequency of SMBG.;Identified appropriate SMBG and/or A1C goals.   Acute complications Taught treatment of hypoglycemia - the 15 rule.   Chronic complications Assessed and discussed foot care and prevention of foot problems;Dental  care;Retinopathy and reason for yearly dilated eye exams   Psychosocial adjustment Worked with patient to identify barriers to care and solutions;Helped patient identify a support system for diabetes management   Individualized Goals (developed by patient)   Nutrition Follow meal plan discussed;General  guidelines for healthy choices and portions discussed;Adjust meds/carbs with exercise as discussed   Physical Activity Exercise 3-5 times per week;30 minutes per day   Monitoring  test my blood glucose as discussed;test blood glucose pre and post meals as discussed   Reducing Risk treat hypoglycemia with 15 grams of carbs if blood glucose less than 70mg /dL;increase portions of nuts and seeds;do foot checks daily   Post-Education Assessment   Patient understands the diabetes disease and treatment process. Demonstrates understanding / competency   Patient understands incorporating nutritional management into lifestyle. Demonstrates understanding / competency   Patient undertands incorporating physical activity into lifestyle. Demonstrates understanding / competency   Patient understands using medications safely. Demonstrates understanding / competency   Patient understands monitoring blood glucose, interpreting and using results Demonstrates understanding / competency   Patient understands prevention, detection, and treatment of acute complications. Demonstrates understanding / competency   Patient understands prevention, detection, and treatment of chronic complications. Demonstrates understanding / competency   Patient understands how to develop strategies to address psychosocial issues. Demonstrates understanding / competency   Patient understands how to develop strategies to promote health/change behavior. Demonstrates understanding / competency   Outcomes   Expected Outcomes Demonstrated interest in learning. Expect positive outcomes   Future DMSE PRN   Program Status Completed      Individualized Plan for Diabetes Self-Management Training:   Learning Objective:  Patient will have a greater understanding of diabetes self-management. Patient education plan is to attend individual and/or group sessions per assessed needs and concerns.   Plan:   There are no Patient Instructions on file  for this visit.  Expected Outcomes:  Demonstrated interest in learning. Expect positive outcomes  Education material provided: Living Well with Diabetes, Meal plan card and Snack sheet  If problems or questions, patient to contact team via:  Phone  Future DSME appointment: PRN

## 2015-10-04 DIAGNOSIS — K029 Dental caries, unspecified: Secondary | ICD-10-CM | POA: Diagnosis not present

## 2015-10-07 ENCOUNTER — Encounter: Payer: Self-pay | Admitting: Internal Medicine

## 2016-01-05 DIAGNOSIS — E1140 Type 2 diabetes mellitus with diabetic neuropathy, unspecified: Secondary | ICD-10-CM | POA: Diagnosis not present

## 2016-01-05 DIAGNOSIS — N951 Menopausal and female climacteric states: Secondary | ICD-10-CM | POA: Diagnosis not present

## 2016-01-05 DIAGNOSIS — I10 Essential (primary) hypertension: Secondary | ICD-10-CM | POA: Diagnosis not present

## 2016-01-05 DIAGNOSIS — Z79899 Other long term (current) drug therapy: Secondary | ICD-10-CM | POA: Diagnosis not present

## 2016-05-10 DIAGNOSIS — E1140 Type 2 diabetes mellitus with diabetic neuropathy, unspecified: Secondary | ICD-10-CM | POA: Diagnosis not present

## 2016-05-10 DIAGNOSIS — Z6829 Body mass index (BMI) 29.0-29.9, adult: Secondary | ICD-10-CM | POA: Diagnosis not present

## 2016-05-10 DIAGNOSIS — I10 Essential (primary) hypertension: Secondary | ICD-10-CM | POA: Diagnosis not present

## 2016-05-10 DIAGNOSIS — Z23 Encounter for immunization: Secondary | ICD-10-CM | POA: Diagnosis not present

## 2016-07-05 DIAGNOSIS — J111 Influenza due to unidentified influenza virus with other respiratory manifestations: Secondary | ICD-10-CM | POA: Diagnosis not present

## 2016-07-05 DIAGNOSIS — E1140 Type 2 diabetes mellitus with diabetic neuropathy, unspecified: Secondary | ICD-10-CM | POA: Diagnosis not present

## 2016-07-05 DIAGNOSIS — I10 Essential (primary) hypertension: Secondary | ICD-10-CM | POA: Diagnosis not present

## 2016-09-28 DIAGNOSIS — E1140 Type 2 diabetes mellitus with diabetic neuropathy, unspecified: Secondary | ICD-10-CM | POA: Diagnosis not present

## 2016-09-28 DIAGNOSIS — I10 Essential (primary) hypertension: Secondary | ICD-10-CM | POA: Diagnosis not present

## 2016-09-28 DIAGNOSIS — Z0000 Encounter for general adult medical examination without abnormal findings: Secondary | ICD-10-CM | POA: Diagnosis not present

## 2016-10-10 DIAGNOSIS — S61012A Laceration without foreign body of left thumb without damage to nail, initial encounter: Secondary | ICD-10-CM | POA: Diagnosis not present

## 2016-11-09 ENCOUNTER — Other Ambulatory Visit: Payer: Self-pay | Admitting: Internal Medicine

## 2016-11-09 DIAGNOSIS — R921 Mammographic calcification found on diagnostic imaging of breast: Secondary | ICD-10-CM

## 2016-11-13 ENCOUNTER — Ambulatory Visit
Admission: RE | Admit: 2016-11-13 | Discharge: 2016-11-13 | Disposition: A | Discharge status: Home / Self Care | Payer: Federal, State, Local not specified - PPO | Source: Ambulatory Visit | Attending: Internal Medicine | Admitting: Internal Medicine

## 2016-11-13 DIAGNOSIS — R921 Mammographic calcification found on diagnostic imaging of breast: Secondary | ICD-10-CM

## 2016-11-13 DIAGNOSIS — E119 Type 2 diabetes mellitus without complications: Secondary | ICD-10-CM | POA: Diagnosis not present

## 2016-11-13 DIAGNOSIS — H40009 Preglaucoma, unspecified, unspecified eye: Secondary | ICD-10-CM | POA: Diagnosis not present

## 2016-12-27 DIAGNOSIS — E119 Type 2 diabetes mellitus without complications: Secondary | ICD-10-CM | POA: Diagnosis not present

## 2016-12-27 DIAGNOSIS — H40009 Preglaucoma, unspecified, unspecified eye: Secondary | ICD-10-CM | POA: Diagnosis not present

## 2017-01-04 ENCOUNTER — Encounter: Payer: Self-pay | Admitting: Obstetrics & Gynecology

## 2017-01-04 ENCOUNTER — Ambulatory Visit (INDEPENDENT_AMBULATORY_CARE_PROVIDER_SITE_OTHER): Payer: Federal, State, Local not specified - PPO | Admitting: Obstetrics & Gynecology

## 2017-01-04 VITALS — BP 117/77 | HR 74 | Wt 162.0 lb

## 2017-01-04 DIAGNOSIS — Z01419 Encounter for gynecological examination (general) (routine) without abnormal findings: Secondary | ICD-10-CM

## 2017-01-04 DIAGNOSIS — Z1151 Encounter for screening for human papillomavirus (HPV): Secondary | ICD-10-CM | POA: Diagnosis not present

## 2017-01-04 DIAGNOSIS — Z79899 Other long term (current) drug therapy: Secondary | ICD-10-CM | POA: Insufficient documentation

## 2017-01-04 DIAGNOSIS — N951 Menopausal and female climacteric states: Secondary | ICD-10-CM | POA: Insufficient documentation

## 2017-01-04 DIAGNOSIS — Z124 Encounter for screening for malignant neoplasm of cervix: Secondary | ICD-10-CM | POA: Diagnosis not present

## 2017-01-04 NOTE — Patient Instructions (Signed)
Preventive Care 40-64 Years, Female Preventive care refers to lifestyle choices and visits with your health care provider that can promote health and wellness. What does preventive care include?  A yearly physical exam. This is also called an annual well check.  Dental exams once or twice a year.  Routine eye exams. Ask your health care provider how often you should have your eyes checked.  Personal lifestyle choices, including: ? Daily care of your teeth and gums. ? Regular physical activity. ? Eating a healthy diet. ? Avoiding tobacco and drug use. ? Limiting alcohol use. ? Practicing safe sex. ? Taking low-dose aspirin daily starting at age 58. ? Taking vitamin and mineral supplements as recommended by your health care provider. What happens during an annual well check? The services and screenings done by your health care provider during your annual well check will depend on your age, overall health, lifestyle risk factors, and family history of disease. Counseling Your health care provider may ask you questions about your:  Alcohol use.  Tobacco use.  Drug use.  Emotional well-being.  Home and relationship well-being.  Sexual activity.  Eating habits.  Work and work Statistician.  Method of birth control.  Menstrual cycle.  Pregnancy history.  Screening You may have the following tests or measurements:  Height, weight, and BMI.  Blood pressure.  Lipid and cholesterol levels. These may be checked every 5 years, or more frequently if you are over 81 years old.  Skin check.  Lung cancer screening. You may have this screening every year starting at age 78 if you have a 30-pack-year history of smoking and currently smoke or have quit within the past 15 years.  Fecal occult blood test (FOBT) of the stool. You may have this test every year starting at age 65.  Flexible sigmoidoscopy or colonoscopy. You may have a sigmoidoscopy every 5 years or a colonoscopy  every 10 years starting at age 30.  Hepatitis C blood test.  Hepatitis B blood test.  Sexually transmitted disease (STD) testing.  Diabetes screening. This is done by checking your blood sugar (glucose) after you have not eaten for a while (fasting). You may have this done every 1-3 years.  Mammogram. This may be done every 1-2 years. Talk to your health care provider about when you should start having regular mammograms. This may depend on whether you have a family history of breast cancer.  BRCA-related cancer screening. This may be done if you have a family history of breast, ovarian, tubal, or peritoneal cancers.  Pelvic exam and Pap test. This may be done every 3 years starting at age 80. Starting at age 36, this may be done every 5 years if you have a Pap test in combination with an HPV test.  Bone density scan. This is done to screen for osteoporosis. You may have this scan if you are at high risk for osteoporosis.  Discuss your test results, treatment options, and if necessary, the need for more tests with your health care provider. Vaccines Your health care provider may recommend certain vaccines, such as:  Influenza vaccine. This is recommended every year.  Tetanus, diphtheria, and acellular pertussis (Tdap, Td) vaccine. You may need a Td booster every 10 years.  Varicella vaccine. You may need this if you have not been vaccinated.  Zoster vaccine. You may need this after age 5.  Measles, mumps, and rubella (MMR) vaccine. You may need at least one dose of MMR if you were born in  1957 or later. You may also need a second dose.  Pneumococcal 13-valent conjugate (PCV13) vaccine. You may need this if you have certain conditions and were not previously vaccinated.  Pneumococcal polysaccharide (PPSV23) vaccine. You may need one or two doses if you smoke cigarettes or if you have certain conditions.  Meningococcal vaccine. You may need this if you have certain  conditions.  Hepatitis A vaccine. You may need this if you have certain conditions or if you travel or work in places where you may be exposed to hepatitis A.  Hepatitis B vaccine. You may need this if you have certain conditions or if you travel or work in places where you may be exposed to hepatitis B.  Haemophilus influenzae type b (Hib) vaccine. You may need this if you have certain conditions.  Talk to your health care provider about which screenings and vaccines you need and how often you need them. This information is not intended to replace advice given to you by your health care provider. Make sure you discuss any questions you have with your health care provider. Document Released: 06/11/2015 Document Revised: 02/02/2016 Document Reviewed: 03/16/2015 Elsevier Interactive Patient Education  2017 [employer].

## 2017-01-04 NOTE — Progress Notes (Signed)
GYNECOLOGY ANNUAL PREVENTATIVE CARE ENCOUNTER NOTE  Subjective:   Sylvia Kelly is a 61 y.o. G54P1011 female here for a routine annual gynecologic exam.  Current complaints: none.   Denies abnormal vaginal bleeding, discharge, pelvic pain, problems with intercourse or other gynecologic concerns.    Gynecologic History No LMP recorded. Patient is postmenopausal. Contraception: post menopausal status Last Pap: More than 5 years ago. No history of cervical dysplasia. Last mammogram: 10/2016. Results were: normal  Obstetric History OB History  Gravida Para Term Preterm AB Living  2 1 1   1 1   SAB TAB Ectopic Multiple Live Births               # Outcome Date GA Lbr Len/2nd Weight Sex Delivery Anes PTL Lv  2 AB           1 Term               Past Medical History:  Diagnosis Date  . Atrophic vaginitis   . Diabetes mellitus   . Elevated cholesterol   . Hypertension     Past Surgical History:  Procedure Laterality Date  . OVARIAN CYST SURGERY  1992  . REFRACTIVE SURGERY    . TUBAL LIGATION      Current Outpatient Prescriptions on File Prior to Visit  Medication Sig Dispense Refill  . aspirin 81 MG tablet Take 81 mg by mouth daily.      Marland Kitchen atorvastatin (LIPITOR) 80 MG tablet Take 80 mg by mouth daily.      Marland Kitchen ibuprofen (ADVIL,MOTRIN) 800 MG tablet Take 1 tablet (800 mg total) by mouth 3 (three) times daily. 21 tablet 0  . lisinopril-hydrochlorothiazide (PRINZIDE,ZESTORETIC) 20-25 MG per tablet Take 1 tablet by mouth daily.      . Multiple Vitamin (MULTIVITAMIN) tablet Take 1 tablet by mouth daily.       No current facility-administered medications on file prior to visit.     No Known Allergies  Social History   Social History  . Marital status: Married    Spouse name: N/A  . Number of children: N/A  . Years of education: N/A   Occupational History  . Not on file.   Social History Main Topics  . Smoking status: Never Smoker  . Smokeless tobacco: Never Used    . Alcohol use No  . Drug use: No  . Sexual activity: Yes    Birth control/ protection: Surgical   Other Topics Concern  . Not on file   Social History Narrative  . No narrative on file    Family History  Problem Relation Age of Onset  . Heart disease Mother   . Diabetes Mother   . Cancer Father        Lung cancer    The following portions of the patient's history were reviewed and updated as appropriate: allergies, current medications, past family history, past medical history, past social history, past surgical history and problem list.  Review of Systems Pertinent items noted in HPI and remainder of comprehensive ROS otherwise negative.   Objective:  BP 117/77   Pulse 74   Wt 162 lb (73.5 kg)   BMI 28.70 kg/m  CONSTITUTIONAL: Well-developed, well-nourished female in no acute distress.  HENT:  Normocephalic, atraumatic, External right and left ear normal. Oropharynx is clear and moist EYES: Conjunctivae and EOM are normal. Pupils are equal, round, and reactive to light. No scleral icterus.  NECK: Normal range of motion, supple, no masses.  Normal thyroid.  SKIN: Skin is warm and dry. No rash noted. Not diaphoretic. No erythema. No pallor. NEUROLOGIC: Alert and oriented to person, place, and time. Normal reflexes, muscle tone coordination. No cranial nerve deficit noted. PSYCHIATRIC: Normal mood and affect. Normal behavior. Normal judgment and thought content. CARDIOVASCULAR: Normal heart rate noted, regular rhythm RESPIRATORY: Clear to auscultation bilaterally. Effort and breath sounds normal, no problems with respiration noted. BREASTS: Symmetric in size. No masses, skin changes, nipple drainage, or lymphadenopathy. ABDOMEN: Soft, normal bowel sounds, no distention noted.  No tenderness, rebound or guarding.  PELVIC: Normal appearing external genitalia; normal appearing vaginal mucosa and cervix.  Mild-moderate atrophy noted. No abnormal discharge noted.  Pap smear  obtained.  Normal uterine size, no other palpable masses, no uterine or adnexal tenderness. MUSCULOSKELETAL: Normal range of motion. No tenderness.  No cyanosis, clubbing, or edema.  2+ distal pulses.   Assessment and Plan:  1. Encounter for gynecological examination with Papanicolaou smear of cervix - Cytology - PAP done, will follow up results of pap smear and manage accordingly. Routine preventative health maintenance measures emphasized. Please refer to After Visit Summary for other counseling recommendations.    Verita Schneiders, MD, [place] Attending Obstetrician & Gynecologist, [place] [facility]

## 2017-01-08 LAB — CYTOLOGY - PAP
Diagnosis: NEGATIVE
HPV: NOT DETECTED

## 2017-02-28 DIAGNOSIS — E113293 Type 2 diabetes mellitus with mild nonproliferative diabetic retinopathy without macular edema, bilateral: Secondary | ICD-10-CM | POA: Diagnosis not present

## 2017-02-28 DIAGNOSIS — H40003 Preglaucoma, unspecified, bilateral: Secondary | ICD-10-CM | POA: Diagnosis not present

## 2017-04-02 DIAGNOSIS — R0982 Postnasal drip: Secondary | ICD-10-CM | POA: Diagnosis not present

## 2017-04-02 DIAGNOSIS — I10 Essential (primary) hypertension: Secondary | ICD-10-CM | POA: Diagnosis not present

## 2017-04-02 DIAGNOSIS — E1140 Type 2 diabetes mellitus with diabetic neuropathy, unspecified: Secondary | ICD-10-CM | POA: Diagnosis not present

## 2017-04-02 DIAGNOSIS — Z79899 Other long term (current) drug therapy: Secondary | ICD-10-CM | POA: Diagnosis not present

## 2017-07-04 DIAGNOSIS — I10 Essential (primary) hypertension: Secondary | ICD-10-CM | POA: Diagnosis not present

## 2017-07-04 DIAGNOSIS — Z79899 Other long term (current) drug therapy: Secondary | ICD-10-CM | POA: Diagnosis not present

## 2017-07-04 DIAGNOSIS — G629 Polyneuropathy, unspecified: Secondary | ICD-10-CM | POA: Diagnosis not present

## 2017-07-04 DIAGNOSIS — E1140 Type 2 diabetes mellitus with diabetic neuropathy, unspecified: Secondary | ICD-10-CM | POA: Diagnosis not present

## 2017-08-02 DIAGNOSIS — G629 Polyneuropathy, unspecified: Secondary | ICD-10-CM | POA: Diagnosis not present

## 2017-08-02 DIAGNOSIS — E1140 Type 2 diabetes mellitus with diabetic neuropathy, unspecified: Secondary | ICD-10-CM | POA: Diagnosis not present

## 2017-08-02 DIAGNOSIS — Z79899 Other long term (current) drug therapy: Secondary | ICD-10-CM | POA: Diagnosis not present

## 2017-08-02 DIAGNOSIS — R0982 Postnasal drip: Secondary | ICD-10-CM | POA: Diagnosis not present

## 2017-08-29 DIAGNOSIS — M25551 Pain in right hip: Secondary | ICD-10-CM | POA: Diagnosis not present

## 2017-10-10 DIAGNOSIS — E049 Nontoxic goiter, unspecified: Secondary | ICD-10-CM | POA: Diagnosis not present

## 2017-10-10 DIAGNOSIS — I10 Essential (primary) hypertension: Secondary | ICD-10-CM | POA: Diagnosis not present

## 2017-10-10 DIAGNOSIS — Z0001 Encounter for general adult medical examination with abnormal findings: Secondary | ICD-10-CM | POA: Diagnosis not present

## 2017-10-10 DIAGNOSIS — Z0000 Encounter for general adult medical examination without abnormal findings: Secondary | ICD-10-CM | POA: Diagnosis not present

## 2017-10-10 DIAGNOSIS — E1140 Type 2 diabetes mellitus with diabetic neuropathy, unspecified: Secondary | ICD-10-CM | POA: Diagnosis not present

## 2017-10-10 LAB — LIPID PANEL
Cholesterol: 162 (ref 0–200)
HDL: 69 (ref 35–70)
LDL Cholesterol: 78
Triglycerides: 73 (ref 40–160)

## 2017-10-10 LAB — TSH: TSH: 0.6 (ref 0.41–5.90)

## 2017-11-01 ENCOUNTER — Other Ambulatory Visit: Payer: Self-pay | Admitting: Internal Medicine

## 2017-11-01 DIAGNOSIS — E049 Nontoxic goiter, unspecified: Secondary | ICD-10-CM

## 2017-11-08 ENCOUNTER — Encounter: Payer: Self-pay | Admitting: Radiology

## 2017-11-26 DIAGNOSIS — B354 Tinea corporis: Secondary | ICD-10-CM | POA: Diagnosis not present

## 2017-11-26 DIAGNOSIS — E1140 Type 2 diabetes mellitus with diabetic neuropathy, unspecified: Secondary | ICD-10-CM | POA: Diagnosis not present

## 2017-11-27 ENCOUNTER — Other Ambulatory Visit: Payer: Self-pay | Admitting: Internal Medicine

## 2017-11-27 DIAGNOSIS — Z1231 Encounter for screening mammogram for malignant neoplasm of breast: Secondary | ICD-10-CM

## 2017-12-19 ENCOUNTER — Emergency Department (HOSPITAL_COMMUNITY)
Admission: EM | Admit: 2017-12-19 | Discharge: 2017-12-19 | Disposition: A | Discharge status: Home / Self Care | Payer: Federal, State, Local not specified - PPO | Attending: Emergency Medicine | Admitting: Emergency Medicine

## 2017-12-19 ENCOUNTER — Other Ambulatory Visit: Payer: Self-pay

## 2017-12-19 ENCOUNTER — Emergency Department (HOSPITAL_COMMUNITY): Payer: Federal, State, Local not specified - PPO

## 2017-12-19 ENCOUNTER — Encounter (HOSPITAL_COMMUNITY): Payer: Self-pay | Admitting: Emergency Medicine

## 2017-12-19 DIAGNOSIS — S0990XA Unspecified injury of head, initial encounter: Secondary | ICD-10-CM | POA: Diagnosis not present

## 2017-12-19 DIAGNOSIS — Z794 Long term (current) use of insulin: Secondary | ICD-10-CM | POA: Insufficient documentation

## 2017-12-19 DIAGNOSIS — Z7982 Long term (current) use of aspirin: Secondary | ICD-10-CM | POA: Diagnosis not present

## 2017-12-19 DIAGNOSIS — H5711 Ocular pain, right eye: Secondary | ICD-10-CM | POA: Diagnosis not present

## 2017-12-19 DIAGNOSIS — E1140 Type 2 diabetes mellitus with diabetic neuropathy, unspecified: Secondary | ICD-10-CM | POA: Diagnosis not present

## 2017-12-19 DIAGNOSIS — Y999 Unspecified external cause status: Secondary | ICD-10-CM | POA: Diagnosis not present

## 2017-12-19 DIAGNOSIS — W19XXXA Unspecified fall, initial encounter: Secondary | ICD-10-CM | POA: Diagnosis not present

## 2017-12-19 DIAGNOSIS — W010XXA Fall on same level from slipping, tripping and stumbling without subsequent striking against object, initial encounter: Secondary | ICD-10-CM | POA: Diagnosis not present

## 2017-12-19 DIAGNOSIS — I10 Essential (primary) hypertension: Secondary | ICD-10-CM | POA: Insufficient documentation

## 2017-12-19 DIAGNOSIS — Y929 Unspecified place or not applicable: Secondary | ICD-10-CM | POA: Diagnosis not present

## 2017-12-19 DIAGNOSIS — E1165 Type 2 diabetes mellitus with hyperglycemia: Secondary | ICD-10-CM | POA: Diagnosis not present

## 2017-12-19 DIAGNOSIS — Y93K1 Activity, walking an animal: Secondary | ICD-10-CM | POA: Diagnosis not present

## 2017-12-19 DIAGNOSIS — S0001XA Abrasion of scalp, initial encounter: Secondary | ICD-10-CM | POA: Diagnosis not present

## 2017-12-19 DIAGNOSIS — S0003XA Contusion of scalp, initial encounter: Secondary | ICD-10-CM | POA: Diagnosis not present

## 2017-12-19 DIAGNOSIS — S0512XA Contusion of eyeball and orbital tissues, left eye, initial encounter: Secondary | ICD-10-CM | POA: Diagnosis not present

## 2017-12-19 DIAGNOSIS — Z79899 Other long term (current) drug therapy: Secondary | ICD-10-CM | POA: Diagnosis not present

## 2017-12-19 DIAGNOSIS — T07XXXA Unspecified multiple injuries, initial encounter: Secondary | ICD-10-CM | POA: Diagnosis not present

## 2017-12-19 DIAGNOSIS — T148XXA Other injury of unspecified body region, initial encounter: Secondary | ICD-10-CM

## 2017-12-19 DIAGNOSIS — R0902 Hypoxemia: Secondary | ICD-10-CM | POA: Diagnosis not present

## 2017-12-19 LAB — CBG MONITORING, ED: Glucose-Capillary: 252 mg/dL — ABNORMAL HIGH (ref 70–99)

## 2017-12-19 MED ORDER — KETOROLAC TROMETHAMINE 60 MG/2ML IM SOLN
15.0000 mg | Freq: Once | INTRAMUSCULAR | Status: AC
  Administered 2017-12-19: 15 mg via INTRAMUSCULAR
  Filled 2017-12-19: qty 2

## 2017-12-19 MED ORDER — OXYCODONE HCL 5 MG PO TABS
5.0000 mg | ORAL_TABLET | Freq: Once | ORAL | Status: AC
Start: 2017-12-19 — End: 2017-12-19
  Administered 2017-12-19: 5 mg via ORAL
  Filled 2017-12-19: qty 1

## 2017-12-19 MED ORDER — ACETAMINOPHEN 500 MG PO TABS
1000.0000 mg | ORAL_TABLET | Freq: Once | ORAL | Status: AC
  Administered 2017-12-19: 1000 mg via ORAL
  Filled 2017-12-19: qty 2

## 2017-12-19 NOTE — ED Triage Notes (Addendum)
Pt was walking her dog and tripped over leash falling face first onto concrete striking her head. Neck pain, 2 in lac over right eye. Right eye swelling and pupils slightly unequal but reactive.  Brief LOC.  Pt is not on blood thinners.  Pt has type II DM and initial CBG was 457.  Fluids given by EMS reduced to 300s.

## 2017-12-19 NOTE — ED Notes (Signed)
Pt has scrapes and abrasions to right hand and bilateral knees as well as the lac noted above right eyebrow. Bleeding controlled.

## 2017-12-19 NOTE — ED Provider Notes (Signed)
[place] EMERGENCY DEPARTMENT Provider Note   CSN: [id number] Arrival date & time: 12/19/17  2144     History   Chief Complaint Chief Complaint  Patient presents with  . Fall  . Head Injury    HPI Sylvia Kelly is a 62 y.o. female.  62 yo F with a chief complaint of a mechanical fall.  She was walking her dog and it walked in front of her and she stepped on the lesion fell down.  Landed on her forehead.  Denies loss of consciousness.  She has a wound to the right frontal area.  Denies any injury to her mouth.  Complaining mostly of pain and swelling around the right eye.  Denies any change in vision denies double vision.  Denies neck pain denies chest pain back pain abdominal pain extremity pain.  The history is provided by the patient.  Illness  This is a new problem. The current episode started yesterday. The problem occurs constantly. The problem has not changed since onset.Pertinent negatives include no chest pain, no headaches and no shortness of breath. Nothing aggravates the symptoms. Nothing relieves the symptoms. She has tried nothing for the symptoms. The treatment provided no relief.    Past Medical History:  Diagnosis Date  . Atrophic vaginitis   . Diabetes mellitus   . Elevated cholesterol   . Hypertension     Patient Active Problem List   Diagnosis Date Noted  . Female climacteric state 01/04/2017  . Other long term (current) drug therapy 01/04/2017  . Essential (primary) hypertension   . Type 2 diabetes mellitus with diabetic neuropathy, unspecified ([place])   . Elevated cholesterol     Past Surgical History:  Procedure Laterality Date  . OVARIAN CYST SURGERY  1992  . REFRACTIVE SURGERY    . TUBAL LIGATION       OB History    Gravida  2   Para  1   Term  1   Preterm      AB  1   Living  1     SAB      TAB      Ectopic      Multiple      Live Births               Home Medications    Prior to  Admission medications   Medication Sig Start Date End Date Taking? Authorizing Provider  aspirin 81 MG tablet Take 81 mg by mouth daily.      [provider]  atorvastatin (LIPITOR) 80 MG tablet Take 80 mg by mouth daily.      [provider]  FREESTYLE LITE test strip  09/28/16   [provider]  ibuprofen (ADVIL,MOTRIN) 800 MG tablet Take 1 tablet (800 mg total) by mouth 3 (three) times daily. 04/10/15   Domenic Moras, PA-C  Insulin Detemir (LEVEMIR Tye) Inject into the skin.    [provider]  JANUMET 50-1000 MG tablet  12/31/16   [provider]  lisinopril-hydrochlorothiazide (PRINZIDE,ZESTORETIC) 20-25 MG per tablet Take 1 tablet by mouth daily.      [provider]  Multiple Vitamin (MULTIVITAMIN) tablet Take 1 tablet by mouth daily.      [provider]    Family History Family History  Problem Relation Age of Onset  . Heart disease Mother   . Diabetes Mother   . Cancer Father        Lung cancer  Social History Social History   Tobacco Use  . Smoking status: Never Smoker  . Smokeless tobacco: Never Used  Substance Use Topics  . Alcohol use: No  . Drug use: No     Allergies   Patient has no known allergies.   Review of Systems Review of Systems  Constitutional: Negative for chills and fever.  HENT: Positive for facial swelling. Negative for congestion and rhinorrhea.   Eyes: Negative for redness and visual disturbance.  Respiratory: Negative for shortness of breath and wheezing.   Cardiovascular: Negative for chest pain and palpitations.  Gastrointestinal: Negative for nausea and vomiting.  Genitourinary: Negative for dysuria and urgency.  Musculoskeletal: Negative for arthralgias and myalgias.  Skin: Negative for pallor and wound.  Neurological: Negative for dizziness and headaches.     Physical Exam Updated Vital Signs BP 138/74   Pulse 88   Resp 20   Ht 5\' 4"  (1.626 m)   Wt 73 kg (161 lb)    SpO2 100%   BMI 27.64 kg/m   Physical Exam  Constitutional: She is oriented to person, place, and time. She appears well-developed and well-nourished. No distress.  HENT:  Head: Normocephalic.  R frontal hematoma with overlying abrasion.  R pupil 31mm L pupil 10mm.    Eyes: EOM are normal.  Anisocoria.  Tenderness to palpation around the right orbit  Neck: Normal range of motion. Neck supple.  Cardiovascular: Normal rate and regular rhythm. Exam reveals no gallop and no friction rub.  No murmur heard. Pulmonary/Chest: Effort normal. She has no wheezes. She has no rales.  Abdominal: Soft. She exhibits no distension and no mass. There is no tenderness. There is no guarding.  Musculoskeletal: She exhibits no edema or tenderness.  Neurological: She is alert and oriented to person, place, and time.  Skin: Skin is warm and dry. She is not diaphoretic.  Psychiatric: She has a normal mood and affect. Her behavior is normal.  Nursing note and vitals reviewed.    ED Treatments / Results  Labs (all labs ordered are listed, but only abnormal results are displayed) Labs Reviewed  CBG MONITORING, ED - Abnormal; Notable for the following components:      Result Value   Glucose-Capillary 252 (*)    All other components within normal limits    EKG None  Radiology Ct Head Wo Contrast  Result Date: 12/19/2017 CLINICAL DATA:  Tripped over dog leash, hit head with laceration over right eye. Right eye swelling EXAM: CT HEAD WITHOUT CONTRAST CT MAXILLOFACIAL WITHOUT CONTRAST TECHNIQUE: Multidetector CT imaging of the head and maxillofacial structures were performed using the standard protocol without intravenous contrast. Multiplanar CT image reconstructions of the maxillofacial structures were also generated. COMPARISON:  MRI brain 06/21/2013, CT brain 03/14/2008 FINDINGS: CT HEAD FINDINGS Brain: No evidence of acute infarction, hemorrhage, hydrocephalus, extra-axial collection or mass lesion/mass  effect. Vascular: No hyperdense vessel or unexpected calcification. Skull: Normal. Negative for fracture or focal lesion. Other: Large right forehead hematoma. CT MAXILLOFACIAL FINDINGS Osseous: Mandibular heads are normally positioned. No mandibular fracture. Mastoid air cells are clear. Pterygoid plates and zygomatic arches are intact. No nasal bone fracture Orbits: Negative. No traumatic or inflammatory finding. Sinuses: Clear. Soft tissues: Large right forehead and periorbital soft tissue swelling IMPRESSION: 1. Negative non contrasted CT appearance of the brain 2. No acute facial bone fracture 3. Large forehead soft tissue and periorbital hematoma Electronically Signed   By: Donavan Foil M.D.   On: 12/19/2017 23:31   Ct  Maxillofacial Wo Contrast  Result Date: 12/19/2017 CLINICAL DATA:  Tripped over dog leash, hit head with laceration over right eye. Right eye swelling EXAM: CT HEAD WITHOUT CONTRAST CT MAXILLOFACIAL WITHOUT CONTRAST TECHNIQUE: Multidetector CT imaging of the head and maxillofacial structures were performed using the standard protocol without intravenous contrast. Multiplanar CT image reconstructions of the maxillofacial structures were also generated. COMPARISON:  MRI brain 06/21/2013, CT brain 03/14/2008 FINDINGS: CT HEAD FINDINGS Brain: No evidence of acute infarction, hemorrhage, hydrocephalus, extra-axial collection or mass lesion/mass effect. Vascular: No hyperdense vessel or unexpected calcification. Skull: Normal. Negative for fracture or focal lesion. Other: Large right forehead hematoma. CT MAXILLOFACIAL FINDINGS Osseous: Mandibular heads are normally positioned. No mandibular fracture. Mastoid air cells are clear. Pterygoid plates and zygomatic arches are intact. No nasal bone fracture Orbits: Negative. No traumatic or inflammatory finding. Sinuses: Clear. Soft tissues: Large right forehead and periorbital soft tissue swelling IMPRESSION: 1. Negative non contrasted CT appearance  of the brain 2. No acute facial bone fracture 3. Large forehead soft tissue and periorbital hematoma Electronically Signed   By: Donavan Foil M.D.   On: 12/19/2017 23:31    Procedures Procedures (including critical care time)  Medications Ordered in ED Medications  ketorolac (TORADOL) injection 15 mg (15 mg Intramuscular Given 12/19/17 2239)  acetaminophen (TYLENOL) tablet 1,000 mg (1,000 mg Oral Given 12/19/17 2239)  oxyCODONE (Oxy IR/ROXICODONE) immediate release tablet 5 mg (5 mg Oral Given 12/19/17 2239)     Initial Impression / Assessment and Plan / ED Course  I have reviewed the triage vital signs and the nursing notes.  Pertinent labs & imaging results that were available during my care of the patient were reviewed by me and considered in my medical decision making (see chart for details).     62 yo F with a chief complaint of a fall.  Patient has a right frontal hematoma.  No laceration to repair.  She does have significant orbital rim tenderness and anisocoria.  Will obtain a CT.  CT negative for acute intracranial pathology as viewed by me.  CT of the face without bony fracture.  Discharge home.  12:08 AM:  I have discussed the diagnosis/risks/treatment options with the patient and family and believe the pt to be eligible for discharge home to follow-up with PCP. We also discussed returning to the ED immediately if new or worsening sx occur. We discussed the sx which are most concerning (e.g., sudden worsening pain, fever, inability to tolerate by mouth) that necessitate immediate return. Medications administered to the patient during their visit and any new prescriptions provided to the patient are listed below.  Medications given during this visit Medications  ketorolac (TORADOL) injection 15 mg (15 mg Intramuscular Given 12/19/17 2239)  acetaminophen (TYLENOL) tablet 1,000 mg (1,000 mg Oral Given 12/19/17 2239)  oxyCODONE (Oxy IR/ROXICODONE) immediate release tablet 5 mg (5 mg  Oral Given 12/19/17 2239)      The patient appears reasonably screen and/or stabilized for discharge and I doubt any other medical condition or other [facility] requiring further screening, evaluation, or treatment in the ED at this time prior to discharge.    Final Clinical Impressions(s) / ED Diagnoses   Final diagnoses:  Fall, initial encounter  Hematoma of frontal scalp, initial encounter  Abrasion    ED Discharge Orders    None       Deno Etienne, DO 12/20/17 [id number]

## 2017-12-25 DIAGNOSIS — W010XXD Fall on same level from slipping, tripping and stumbling without subsequent striking against object, subsequent encounter: Secondary | ICD-10-CM | POA: Diagnosis not present

## 2017-12-25 DIAGNOSIS — S0510XD Contusion of eyeball and orbital tissues, unspecified eye, subsequent encounter: Secondary | ICD-10-CM | POA: Diagnosis not present

## 2017-12-25 DIAGNOSIS — S0081XD Abrasion of other part of head, subsequent encounter: Secondary | ICD-10-CM | POA: Diagnosis not present

## 2017-12-25 DIAGNOSIS — R51 Headache: Secondary | ICD-10-CM | POA: Diagnosis not present

## 2017-12-26 ENCOUNTER — Ambulatory Visit
Admission: RE | Admit: 2017-12-26 | Discharge: 2017-12-26 | Disposition: A | Discharge status: Home / Self Care | Payer: Federal, State, Local not specified - PPO | Source: Ambulatory Visit | Attending: Internal Medicine | Admitting: Internal Medicine

## 2017-12-26 DIAGNOSIS — Z1231 Encounter for screening mammogram for malignant neoplasm of breast: Secondary | ICD-10-CM | POA: Diagnosis not present

## 2017-12-28 DIAGNOSIS — E119 Type 2 diabetes mellitus without complications: Secondary | ICD-10-CM | POA: Diagnosis not present

## 2018-01-02 NOTE — Progress Notes (Deleted)
   Patient did not show up today for her scheduled appointment.   Marcey Persad, MD, FACOG Obstetrician & Gynecologist, [facility], [facility]  

## 2018-01-07 ENCOUNTER — Ambulatory Visit: Payer: Federal, State, Local not specified - PPO | Admitting: Obstetrics & Gynecology

## 2018-01-07 NOTE — Progress Notes (Deleted)
GYNECOLOGY ANNUAL PREVENTATIVE CARE ENCOUNTER NOTE  Subjective:   Sylvia Kelly is a 62 y.o. G2P1011 postmenopausal female here for a routine annual gynecologic exam.  Current complaints: ***.   Denies abnormal vaginal bleeding, discharge, pelvic pain, problems with intercourse or other gynecologic concerns.    Gynecologic History No LMP recorded. Patient is postmenopausal. Contraception: post menopausal status Last Pap: 01/04/2018. Results were: normal with negative HPV Last mammogram: 12/26/2017. Results were: normal  Obstetric History OB History  Gravida Para Term Preterm AB Living  2 1 1   1 1   SAB TAB Ectopic Multiple Live Births               # Outcome Date GA Lbr Len/2nd Weight Sex Delivery Anes PTL Lv  2 AB           1 Term             Past Medical History:  Diagnosis Date  . Atrophic vaginitis   . Diabetes mellitus   . Elevated cholesterol   . Hypertension     Past Surgical History:  Procedure Laterality Date  . OVARIAN CYST SURGERY  1992  . REFRACTIVE SURGERY    . TUBAL LIGATION      Current Outpatient Medications on File Prior to Visit  Medication Sig Dispense Refill  . aspirin 81 MG tablet Take 81 mg by mouth daily.      Marland Kitchen atorvastatin (LIPITOR) 80 MG tablet Take 80 mg by mouth daily.      Marland Kitchen FREESTYLE LITE test strip     . ibuprofen (ADVIL,MOTRIN) 800 MG tablet Take 1 tablet (800 mg total) by mouth 3 (three) times daily. 21 tablet 0  . Insulin Detemir (LEVEMIR [place]) Inject into the skin.    Marland Kitchen JANUMET 50-1000 MG tablet     . lisinopril-hydrochlorothiazide (PRINZIDE,ZESTORETIC) 20-25 MG per tablet Take 1 tablet by mouth daily.      . Multiple Vitamin (MULTIVITAMIN) tablet Take 1 tablet by mouth daily.       No current facility-administered medications on file prior to visit.     No Known Allergies  Social History:  reports that she has never smoked. She has never used smokeless tobacco. She reports that she does not drink alcohol or use  drugs.  Family History  Problem Relation Age of Onset  . Heart disease Mother   . Diabetes Mother   . Cancer Father        Lung cancer    The following portions of the patient's history were reviewed and updated as appropriate: allergies, current medications, past family history, past medical history, past social history, past surgical history and problem list.  Review of Systems Pertinent items noted in HPI and remainder of comprehensive ROS otherwise negative.   Objective:  There were no vitals taken for this visit. CONSTITUTIONAL: Well-developed, well-nourished female in no acute distress.  HENT:  Normocephalic, atraumatic, External right and left ear normal. Oropharynx is clear and moist EYES: Conjunctivae and EOM are normal. Pupils are equal, round, and reactive to light. No scleral icterus.  NECK: Normal range of motion, supple, no masses.  Normal thyroid.  SKIN: Skin is warm and dry. No rash noted. Not diaphoretic. No erythema. No pallor. NEUROLOGIC: Alert and oriented to person, place, and time. Normal reflexes, muscle tone coordination. No cranial nerve deficit noted. PSYCHIATRIC: Normal mood and affect. Normal behavior. Normal judgment and thought content. CARDIOVASCULAR: Normal heart rate noted, regular rhythm RESPIRATORY: Clear to auscultation  bilaterally. Effort and breath sounds normal, no problems with respiration noted. BREASTS: Symmetric in size. No masses, skin changes, nipple drainage, or lymphadenopathy. ABDOMEN: Soft, normal bowel sounds, no distention noted.  No tenderness, rebound or guarding.  MUSCULOSKELETAL: Normal range of motion. No tenderness.  No cyanosis, clubbing, or edema.  2+ distal pulses. PELVIC: Normal appearing external genitalia; normal appearing vaginal mucosa and cervix.  Mild-moderate atrophy noted. No abnormal discharge noted.  Pap smear obtained.  Normal uterine size, no other palpable masses, no uterine or adnexal tenderness.   Assessment  and Plan:  1. Encounter for gynecological examination with Papanicolaou smear of cervix *** Will follow up results of pap smear and manage accordingly.  2. Breast cancer screening by mammogram *** Mammogram scheduled   Routine preventative health maintenance measures emphasized. Please refer to After Visit Summary for other counseling recommendations.    Verita Schneiders, MD, [place] for [facility], [place]

## 2018-01-16 DIAGNOSIS — E1140 Type 2 diabetes mellitus with diabetic neuropathy, unspecified: Secondary | ICD-10-CM | POA: Diagnosis not present

## 2018-01-16 DIAGNOSIS — R000 Tachycardia, unspecified: Secondary | ICD-10-CM | POA: Diagnosis not present

## 2018-01-16 DIAGNOSIS — I10 Essential (primary) hypertension: Secondary | ICD-10-CM | POA: Diagnosis not present

## 2018-01-16 DIAGNOSIS — Z79899 Other long term (current) drug therapy: Secondary | ICD-10-CM | POA: Diagnosis not present

## 2018-01-16 LAB — HEMOGLOBIN A1C: Hemoglobin A1C: 7.7

## 2018-01-16 LAB — BASIC METABOLIC PANEL
BUN: 14 (ref 4–21)
Creatinine: 1 (ref 0.5–1.1)
Glucose: 243

## 2018-02-22 ENCOUNTER — Ambulatory Visit (INDEPENDENT_AMBULATORY_CARE_PROVIDER_SITE_OTHER): Payer: Federal, State, Local not specified - PPO | Admitting: Obstetrics & Gynecology

## 2018-02-22 ENCOUNTER — Encounter: Payer: Self-pay | Admitting: Obstetrics & Gynecology

## 2018-02-22 VITALS — BP 104/69 | HR 86 | Ht 63.0 in | Wt 167.4 lb

## 2018-02-22 DIAGNOSIS — Z01419 Encounter for gynecological examination (general) (routine) without abnormal findings: Secondary | ICD-10-CM | POA: Diagnosis not present

## 2018-02-22 NOTE — Progress Notes (Signed)
GYNECOLOGY ANNUAL PREVENTATIVE CARE ENCOUNTER NOTE  Subjective:   Sylvia Kelly is a 62 y.o. G52P1011 female here for a routine annual gynecologic exam.  Current complaints: none.   Denies abnormal vaginal bleeding, discharge, pelvic pain, problems with intercourse or other gynecologic concerns.    Gynecologic History No LMP recorded. Patient is postmenopausal. Contraception: post menopausal status Last Pap: 01/04/2017. Results were: normal with negative HPV Last mammogram: 12/26/2017. Results were: normal  Obstetric History OB History  Gravida Para Term Preterm AB Living  2 1 1   1 1   SAB TAB Ectopic Multiple Live Births               # Outcome Date GA Lbr Len/2nd Weight Sex Delivery Anes PTL Lv  2 AB           1 Term             Past Medical History:  Diagnosis Date  . Atrophic vaginitis   . Diabetes mellitus   . Elevated cholesterol   . Hypertension     Past Surgical History:  Procedure Laterality Date  . OVARIAN CYST SURGERY  1992  . REFRACTIVE SURGERY    . TUBAL LIGATION      Current Outpatient Medications on File Prior to Visit  Medication Sig Dispense Refill  . aspirin 81 MG tablet Take 81 mg by mouth daily.      Marland Kitchen atorvastatin (LIPITOR) 80 MG tablet Take 80 mg by mouth daily.      Marland Kitchen FREESTYLE LITE test strip     . ibuprofen (ADVIL,MOTRIN) 800 MG tablet Take 1 tablet (800 mg total) by mouth 3 (three) times daily. 21 tablet 0  . JANUMET 50-1000 MG tablet     . lisinopril-hydrochlorothiazide (PRINZIDE,ZESTORETIC) 20-25 MG per tablet Take 1 tablet by mouth daily.      . Multiple Vitamin (MULTIVITAMIN) tablet Take 1 tablet by mouth daily.      . TRESIBA FLEXTOUCH 200 UNIT/ML SOPN     . Insulin Detemir (LEVEMIR [place]) Inject into the skin.     No current facility-administered medications on file prior to visit.     No Known Allergies  Social History:  reports that she has never smoked. She has never used smokeless tobacco. She reports that she does not drink  alcohol or use drugs.  Family History  Problem Relation Age of Onset  . Heart disease Mother   . Diabetes Mother   . Cancer Father        Lung cancer    The following portions of the patient's history were reviewed and updated as appropriate: allergies, current medications, past family history, past medical history, past social history, past surgical history and problem list.  Review of Systems Pertinent items noted in HPI and remainder of comprehensive ROS otherwise negative.   Objective:  BP 104/69   Pulse 86   Ht 5\' 3"  (1.6 m)   Wt 167 lb 6.4 oz (75.9 kg)   BMI 29.65 kg/m  CONSTITUTIONAL: Well-developed, well-nourished female in no acute distress.  HENT:  Normocephalic, atraumatic, External right and left ear normal. Oropharynx is clear and moist EYES: Conjunctivae and EOM are normal. Pupils are equal, round, and reactive to light. No scleral icterus.  NECK: Normal range of motion, supple, no masses.  Normal thyroid.  SKIN: Skin is warm and dry. No rash noted. Not diaphoretic. No erythema. No pallor. MUSCULOSKELETAL: Normal range of motion. No tenderness.  No cyanosis, clubbing, or edema.  2+ distal pulses. NEUROLOGIC: Alert and oriented to person, place, and time. Normal reflexes, muscle tone coordination. No cranial nerve deficit noted. PSYCHIATRIC: Normal mood and affect. Normal behavior. Normal judgment and thought content. CARDIOVASCULAR: Normal heart rate noted, regular rhythm RESPIRATORY: Clear to auscultation bilaterally. Effort and breath sounds normal, no problems with respiration noted. BREASTS: Symmetric in size. No masses, skin changes, nipple drainage, or lymphadenopathy. ABDOMEN: Soft, normal bowel sounds, no distention noted.  No tenderness, rebound or guarding.  PELVIC: Normal appearing external genitalia with moderate atrophy; normal appearing vaginal mucosa and cervix.  No abnormal discharge noted.   Normal uterine size, no other palpable masses, no uterine or  adnexal tenderness.   Assessment and Plan:  1. Well woman exam Up to date for pap smear and mammogram. No GYN concerns. Routine preventative health maintenance measures emphasized. Please refer to After Visit Summary for other counseling recommendations.    Verita Schneiders, MD, [place] for [facility], [place]

## 2018-02-22 NOTE — Patient Instructions (Signed)
Preventive Care 40-64 Years, Female Preventive care refers to lifestyle choices and visits with your health care provider that can promote health and wellness. What does preventive care include?  A yearly physical exam. This is also called an annual well check.  Dental exams once or twice a year.  Routine eye exams. Ask your health care provider how often you should have your eyes checked.  Personal lifestyle choices, including: ? Daily care of your teeth and gums. ? Regular physical activity. ? Eating a healthy diet. ? Avoiding tobacco and drug use. ? Limiting alcohol use. ? Practicing safe sex. ? Taking low-dose aspirin daily starting at age 58. ? Taking vitamin and mineral supplements as recommended by your health care provider. What happens during an annual well check? The services and screenings done by your health care provider during your annual well check will depend on your age, overall health, lifestyle risk factors, and family history of disease. Counseling Your health care provider may ask you questions about your:  Alcohol use.  Tobacco use.  Drug use.  Emotional well-being.  Home and relationship well-being.  Sexual activity.  Eating habits.  Work and work Statistician.  Method of birth control.  Menstrual cycle.  Pregnancy history.  Screening You may have the following tests or measurements:  Height, weight, and BMI.  Blood pressure.  Lipid and cholesterol levels. These may be checked every 5 years, or more frequently if you are over 81 years old.  Skin check.  Lung cancer screening. You may have this screening every year starting at age 78 if you have a 30-pack-year history of smoking and currently smoke or have quit within the past 15 years.  Fecal occult blood test (FOBT) of the stool. You may have this test every year starting at age 65.  Flexible sigmoidoscopy or colonoscopy. You may have a sigmoidoscopy every 5 years or a colonoscopy  every 10 years starting at age 30.  Hepatitis C blood test.  Hepatitis B blood test.  Sexually transmitted disease (STD) testing.  Diabetes screening. This is done by checking your blood sugar (glucose) after you have not eaten for a while (fasting). You may have this done every 1-3 years.  Mammogram. This may be done every 1-2 years. Talk to your health care provider about when you should start having regular mammograms. This may depend on whether you have a family history of breast cancer.  BRCA-related cancer screening. This may be done if you have a family history of breast, ovarian, tubal, or peritoneal cancers.  Pelvic exam and Pap test. This may be done every 3 years starting at age 80. Starting at age 36, this may be done every 5 years if you have a Pap test in combination with an HPV test.  Bone density scan. This is done to screen for osteoporosis. You may have this scan if you are at high risk for osteoporosis.  Discuss your test results, treatment options, and if necessary, the need for more tests with your health care provider. Vaccines Your health care provider may recommend certain vaccines, such as:  Influenza vaccine. This is recommended every year.  Tetanus, diphtheria, and acellular pertussis (Tdap, Td) vaccine. You may need a Td booster every 10 years.  Varicella vaccine. You may need this if you have not been vaccinated.  Zoster vaccine. You may need this after age 5.  Measles, mumps, and rubella (MMR) vaccine. You may need at least one dose of MMR if you were born in  1957 or later. You may also need a second dose.  Pneumococcal 13-valent conjugate (PCV13) vaccine. You may need this if you have certain conditions and were not previously vaccinated.  Pneumococcal polysaccharide (PPSV23) vaccine. You may need one or two doses if you smoke cigarettes or if you have certain conditions.  Meningococcal vaccine. You may need this if you have certain  conditions.  Hepatitis A vaccine. You may need this if you have certain conditions or if you travel or work in places where you may be exposed to hepatitis A.  Hepatitis B vaccine. You may need this if you have certain conditions or if you travel or work in places where you may be exposed to hepatitis B.  Haemophilus influenzae type b (Hib) vaccine. You may need this if you have certain conditions.  Talk to your health care provider about which screenings and vaccines you need and how often you need them. This information is not intended to replace advice given to you by your health care provider. Make sure you discuss any questions you have with your health care provider. Document Released: 06/11/2015 Document Revised: 02/02/2016 Document Reviewed: 03/16/2015 Elsevier Interactive Patient Education  2018 [employer]  

## 2018-02-22 NOTE — Progress Notes (Signed)
Pt is here for annual exam. Last pap 01/04/17 normal -hpv. [facility], pt reports it was normal. Has not had a colonoscopy.

## 2018-02-27 ENCOUNTER — Other Ambulatory Visit: Payer: Self-pay | Admitting: Internal Medicine

## 2018-02-28 ENCOUNTER — Other Ambulatory Visit: Payer: Self-pay | Admitting: Internal Medicine

## 2018-03-20 ENCOUNTER — Other Ambulatory Visit: Payer: Self-pay | Admitting: Internal Medicine

## 2018-03-28 ENCOUNTER — Other Ambulatory Visit: Payer: Self-pay | Admitting: Nurse Practitioner

## 2018-04-05 ENCOUNTER — Telehealth: Payer: Self-pay

## 2018-04-05 ENCOUNTER — Other Ambulatory Visit: Payer: Self-pay | Admitting: Internal Medicine

## 2018-04-05 MED ORDER — JANUMET 50-1000 MG PO TABS: 1.0000 | ORAL_TABLET | Freq: Two times a day (BID) | ORAL | 5 refills | Status: DC

## 2018-04-05 NOTE — Telephone Encounter (Signed)
Left the pt a message that I was calling to see what she needed a refill on.

## 2018-04-10 ENCOUNTER — Other Ambulatory Visit: Payer: Self-pay | Admitting: Internal Medicine

## 2018-04-15 DIAGNOSIS — M25572 Pain in left ankle and joints of left foot: Secondary | ICD-10-CM | POA: Diagnosis not present

## 2018-04-15 DIAGNOSIS — M79662 Pain in left lower leg: Secondary | ICD-10-CM | POA: Diagnosis not present

## 2018-04-15 DIAGNOSIS — M545 Low back pain: Secondary | ICD-10-CM | POA: Diagnosis not present

## 2018-04-17 ENCOUNTER — Ambulatory Visit: Payer: Federal, State, Local not specified - PPO | Admitting: Internal Medicine

## 2018-04-17 ENCOUNTER — Encounter: Payer: Self-pay | Admitting: Internal Medicine

## 2018-04-17 VITALS — BP 116/78 | HR 86 | Temp 97.2°F | Ht 63.0 in | Wt 171.4 lb

## 2018-04-17 DIAGNOSIS — E6609 Other obesity due to excess calories: Secondary | ICD-10-CM | POA: Diagnosis not present

## 2018-04-17 DIAGNOSIS — Z6830 Body mass index (BMI) 30.0-30.9, adult: Secondary | ICD-10-CM

## 2018-04-17 DIAGNOSIS — I10 Essential (primary) hypertension: Secondary | ICD-10-CM

## 2018-04-17 DIAGNOSIS — Z7982 Long term (current) use of aspirin: Secondary | ICD-10-CM

## 2018-04-17 DIAGNOSIS — E1141 Type 2 diabetes mellitus with diabetic mononeuropathy: Secondary | ICD-10-CM | POA: Diagnosis not present

## 2018-04-17 DIAGNOSIS — M722 Plantar fascial fibromatosis: Secondary | ICD-10-CM | POA: Diagnosis not present

## 2018-04-17 NOTE — Patient Instructions (Signed)
Diabetes Mellitus and Exercise Exercising regularly is important for your overall health, especially when you have diabetes (diabetes mellitus). Exercising is not only about losing weight. It has many health benefits, such as increasing muscle strength and bone density and reducing body fat and stress. This leads to improved fitness, flexibility, and endurance, all of which result in better overall health. Exercise has additional benefits for people with diabetes, including:  Reducing appetite.  Helping to lower and control blood glucose.  Lowering blood pressure.  Helping to control amounts of fatty substances (lipids) in the blood, such as cholesterol and triglycerides.  Helping the body to respond better to insulin (improving insulin sensitivity).  Reducing how much insulin the body needs.  Decreasing the risk for heart disease by: ? Lowering cholesterol and triglyceride levels. ? Increasing the levels of good cholesterol. ? Lowering blood glucose levels.  What is my activity plan? Your health care provider or certified diabetes educator can help you make a plan for the type and frequency of exercise (activity plan) that works for you. Make sure that you:  Do at least 150 minutes of moderate-intensity or vigorous-intensity exercise each week. This could be brisk walking, biking, or water aerobics. ? Do stretching and strength exercises, such as yoga or weightlifting, at least 2 times a week. ? Spread out your activity over at least 3 days of the week.  Get some form of physical activity every day. ? Do not go more than 2 days in a row without some kind of physical activity. ? Avoid being inactive for more than 90 minutes at a time. Take frequent breaks to walk or stretch.  Choose a type of exercise or activity that you enjoy, and set realistic goals.  Start slowly, and gradually increase the intensity of your exercise over time.  What do I need to know about managing my  diabetes?  Check your blood glucose before and after exercising. ? If your blood glucose is higher than 240 mg/dL (13.3 mmol/L) before you exercise, check your urine for ketones. If you have ketones in your urine, do not exercise until your blood glucose returns to normal.  Know the symptoms of low blood glucose (hypoglycemia) and how to treat it. Your risk for hypoglycemia increases during and after exercise. Common symptoms of hypoglycemia can include: ? Hunger. ? Anxiety. ? Sweating and feeling clammy. ? Confusion. ? Dizziness or feeling light-headed. ? Increased heart rate or palpitations. ? Blurry vision. ? Tingling or numbness around the mouth, lips, or tongue. ? Tremors or shakes. ? Irritability.  Keep a rapid-acting carbohydrate snack available before, during, and after exercise to help prevent or treat hypoglycemia.  Avoid injecting insulin into areas of the body that are going to be exercised. For example, avoid injecting insulin into: ? The arms, when playing tennis. ? The legs, when jogging.  Keep records of your exercise habits. Doing this can help you and your health care provider adjust your diabetes management plan as needed. Write down: ? Food that you eat before and after you exercise. ? Blood glucose levels before and after you exercise. ? The type and amount of exercise you have done. ? When your insulin is expected to peak, if you use insulin. Avoid exercising at times when your insulin is peaking.  When you start a new exercise or activity, work with your health care provider to make sure the activity is safe for you, and to adjust your insulin, medicines, or food intake as needed.    Drink plenty of water while you exercise to prevent dehydration or heat stroke. Drink enough fluid to keep your urine clear or pale yellow. This information is not intended to replace advice given to you by your health care provider. Make sure you discuss any questions you have with  your health care provider. Document Released: 08/05/2003 Document Revised: 12/03/2015 Document Reviewed: 10/25/2015 Elsevier Interactive Patient Education  2018 [place].   Plantar Fasciitis Plantar fasciitis is a painful foot condition that affects the heel. It occurs when the band of tissue that connects the toes to the heel bone (plantar fascia) becomes irritated. This can happen after exercising too much or doing other repetitive activities (overuse injury). The pain from plantar fasciitis can range from mild irritation to severe pain that makes it difficult for you to walk or move. The pain is usually worse in the morning or after you have been sitting or lying down for a while. What are the causes? This condition may be caused by:  Standing for long periods of time.  Wearing shoes that do not fit.  Doing high-impact activities, including running, aerobics, and ballet.  Being overweight.  Having an abnormal way of walking (gait).  Having tight calf muscles.  Having high arches in your feet.  Starting a new athletic activity.  What are the signs or symptoms? The main symptom of this condition is heel pain. Other symptoms include:  Pain that gets worse after activity or exercise.  Pain that is worse in the morning or after resting.  Pain that goes away after you walk for a few minutes.  How is this diagnosed? This condition may be diagnosed based on your signs and symptoms. Your health care provider will also do a physical exam to check for:  A tender area on the bottom of your foot.  A high arch in your foot.  Pain when you move your foot.  Difficulty moving your foot.  You may also need to have imaging studies to confirm the diagnosis. These can include:  X-rays.  Ultrasound.  MRI.  How is this treated? Treatment for plantar fasciitis depends on the severity of the condition. Your treatment may include:  Rest, ice, and over-the-counter pain medicines  to manage your pain.  Exercises to stretch your calves and your plantar fascia.  A splint that holds your foot in a stretched, upward position while you sleep (night splint).  Physical therapy to relieve symptoms and prevent problems in the future.  Cortisone injections to relieve severe pain.  Extracorporeal shock wave therapy (ESWT) to stimulate damaged plantar fascia with electrical impulses. It is often used as a last resort before surgery.  Surgery, if other treatments have not worked after 12 months.  Follow these instructions at home:  Take medicines only as directed by your health care provider.  Avoid activities that cause pain.  Roll the bottom of your foot over a bag of ice or a bottle of cold water. Do this for 20 minutes, 3-4 times a day.  Perform simple stretches as directed by your health care provider.  Try wearing athletic shoes with air-sole or gel-sole cushions or soft shoe inserts.  Wear a night splint while sleeping, if directed by your health care provider.  Keep all follow-up appointments with your health care provider. How is this prevented?  Do not perform exercises or activities that cause heel pain.  Consider finding low-impact activities if you continue to have problems.  Lose weight if you need to.  The best way to prevent plantar fasciitis is to avoid the activities that aggravate your plantar fascia. Contact a health care provider if:  Your symptoms do not go away after treatment with home care measures.  Your pain gets worse.  Your pain affects your ability to move or do your daily activities. This information is not intended to replace advice given to you by your health care provider. Make sure you discuss any questions you have with your health care provider. Document Released: 02/07/2001 Document Revised: 10/18/2015 Document Reviewed: 03/25/2014 Elsevier Interactive Patient Education  [employer].

## 2018-04-18 LAB — CMP14+EGFR
ALT: 19 IU/L (ref 0–32)
AST: 16 IU/L (ref 0–40)
Albumin/Globulin Ratio: 1.8 (ref 1.2–2.2)
Albumin: 4.3 g/dL (ref 3.6–4.8)
Alkaline Phosphatase: 78 IU/L (ref 39–117)
BUN/Creatinine Ratio: 16 (ref 12–28)
BUN: 14 mg/dL (ref 8–27)
Bilirubin Total: <0.2 mg/dL (ref 0.0–1.2)
CO2: 25 mmol/L (ref 20–29)
Calcium: 9.3 mg/dL (ref 8.7–10.3)
Chloride: 99 mmol/L (ref 96–106)
Creatinine, Ser: 0.9 mg/dL (ref 0.57–1.00)
GFR calc Af Amer: 79 mL/min/{1.73_m2} (ref >59)
GFR calc non Af Amer: 69 mL/min/{1.73_m2} (ref >59)
Globulin, Total: 2.4 g/dL (ref 1.5–4.5)
Glucose: 245 mg/dL — ABNORMAL HIGH (ref 65–99)
Potassium: 4.2 mmol/L (ref 3.5–5.2)
Sodium: 138 mmol/L (ref 134–144)
Total Protein: 6.7 g/dL (ref 6.0–8.5)

## 2018-04-18 LAB — LIPID PANEL
Chol/HDL Ratio: 2.9 ratio (ref 0.0–4.4)
Cholesterol, Total: 164 mg/dL (ref 100–199)
HDL: 56 mg/dL (ref >39)
LDL Calculated: 83 mg/dL (ref 0–99)
Triglycerides: 123 mg/dL (ref 0–149)
VLDL Cholesterol Cal: 25 mg/dL (ref 5–40)

## 2018-04-18 LAB — HEMOGLOBIN A1C
Est. average glucose Bld gHb Est-mCnc: 214 mg/dL
Hgb A1c MFr Bld: 9.1 % — ABNORMAL HIGH (ref 4.8–5.6)

## 2018-04-19 NOTE — Progress Notes (Signed)
Your liver and kidney function are stable. Your a1c is 9.1, this is worse than last visit. What happened? Are you taking your medications every day? Please avoid sugary beverages and white breads. Your cholesterol is great.   We must get your a1c back to goal. Please share what you think the issue is.  Sincerely,    Alyce Inscore N[facility], MD

## 2018-04-21 NOTE — Progress Notes (Signed)
Subjective:     Patient ID: Sylvia Kelly , female    DOB: August 03, 1955 , 62 y.o.   MRN: [id number]   Chief Complaint  Patient presents with  . Diabetes  . Hypertension    HPI  Diabetes  She presents for her follow-up diabetic visit. She has type 2 diabetes mellitus. Her disease course has been worsening. There are no hypoglycemic associated symptoms. Pertinent negatives for hypoglycemia include no headaches. There are no diabetic associated symptoms. Pertinent negatives for diabetes include no blurred vision and no chest pain. There are no hypoglycemic complications. Risk factors for coronary artery disease include diabetes mellitus, dyslipidemia, hypertension, obesity and sedentary lifestyle. Current diabetic treatment includes insulin injections. She is compliant with treatment some of the time.  Hypertension  This is a chronic problem. The current episode started more than 1 year ago. The problem has been gradually improving since onset. Pertinent negatives include no blurred vision, chest pain, headaches, orthopnea, palpitations or shortness of breath.     Past Medical History:  Diagnosis Date  . Atrophic vaginitis   . Diabetes mellitus   . Elevated cholesterol   . Hypertension      Family History  Problem Relation Age of Onset  . Heart disease Mother   . Diabetes Mother   . Cancer Father        Lung cancer     Current Outpatient Medications:  .  aspirin 81 MG tablet, Take 81 mg by mouth daily.  , Disp: , Rfl:  .  atorvastatin (LIPITOR) 80 MG tablet, Take 80 mg by mouth daily.  , Disp: , Rfl:  .  Continuous Blood Gluc Sensor (FREESTYLE LIBRE 14 DAY SENSOR) MISC, USE AS DIRECTED 4X DAILY, Disp: 3 each, Rfl: 2 .  ibuprofen (ADVIL,MOTRIN) 800 MG tablet, Take 1 tablet (800 mg total) by mouth 3 (three) times daily., Disp: 21 tablet, Rfl: 0 .  JANUMET 50-1000 MG tablet, Take 1 tablet by mouth 2 (two) times daily with a meal., Disp: 60 tablet, Rfl: 5 .   lisinopril-hydrochlorothiazide (PRINZIDE,ZESTORETIC) 20-25 MG tablet, TAKE 1 TABLET BY MOUTH EVERY DAY, Disp: 90 tablet, Rfl: 1 .  Multiple Vitamin (MULTIVITAMIN) tablet, Take 1 tablet by mouth daily.  , Disp: , Rfl:  .  TRESIBA FLEXTOUCH 200 UNIT/ML SOPN, INJECT 44 UNITS BY SUDCUTANEOUS ROUTE AS PER INSULIN PROTOCAL, Disp: 9 pen, Rfl: 0   No Known Allergies   Review of Systems  Constitutional: Negative.   Eyes: Negative for blurred vision.  Respiratory: Negative.  Negative for shortness of breath.   Cardiovascular: Negative.  Negative for chest pain, palpitations and orthopnea.  Gastrointestinal: Negative.   Musculoskeletal: Positive for arthralgias.       She c/o b/l foot pain. There is some pain when she first gets out of bed in the am. She is also followed by podiatry  Neurological: Negative.  Negative for headaches.  Psychiatric/Behavioral: Negative.      Today's Vitals   04/17/18 1530  BP: 116/78  Pulse: 86  Temp: (!) 97.2 F (36.2 C)  TempSrc: Oral  Weight: 171 lb 6.4 oz (77.7 kg)  Height: 5' 3"  (1.6 m)   Body mass index is 30.36 kg/m.   Objective:  Physical Exam  Constitutional: She is oriented to person, place, and time. She appears well-developed and well-nourished.  HENT:  Head: Normocephalic and atraumatic.  Eyes: EOM are normal.  Cardiovascular: Normal rate, regular rhythm and normal heart sounds.  Pulmonary/Chest: Effort normal and breath sounds normal.  Neurological: She is alert and oriented to person, place, and time.  Psychiatric: She has a normal mood and affect.  Nursing note and vitals reviewed.       Assessment And Plan:     1. Diabetic mononeuropathy associated with type 2 diabetes mellitus ([place])  I will check labs as listed below. She denies personal/family history of thyroid cancer.  WE DISCUSSED STARTING OZEMPIC TO HELP HER ACHIEVE GLYCEMIC CONTROL. SHE WAS TAUGHT HOW TO SELF-ADMINISTER THE MEDICATION. SHE IS ALREADY FAMILIAR WITH PEN  DEVICES.  SHE WILL START WITH 0.25MG ONCE WEEKLY X 4 WEEKS, THEN 0.5MG ONCE WEEKLY X 2 WEEKS. . SHE WILL RTO IN SIX WEEKS FOR RE-EVALUATION.  - CMP14+EGFR - Hemoglobin A1c - Lipid Profile  2. Essential hypertension, benign  Well controlled. She will continue with current meds. She is encouraged to avoid adding salt to her foods.   3. Plantar fasciitis of left foot  She was given some stretching exercises to perform daily - both upon awakening and while seated.   4. Class 1 obesity due to excess calories with serious comorbidity and body mass index (BMI) of 30.0 to 30.9 in adult  She is encouraged to strive for BMI less than 28 to decrease cardiac risk. She is encouraged to exercise no less than four to five days weekly for 30 minutes.   [name], MD

## 2018-04-23 DIAGNOSIS — M50322 Other cervical disc degeneration at C5-C6 level: Secondary | ICD-10-CM | POA: Diagnosis not present

## 2018-04-23 DIAGNOSIS — M5442 Lumbago with sciatica, left side: Secondary | ICD-10-CM | POA: Diagnosis not present

## 2018-04-23 DIAGNOSIS — M50323 Other cervical disc degeneration at C6-C7 level: Secondary | ICD-10-CM | POA: Diagnosis not present

## 2018-04-23 DIAGNOSIS — M40292 Other kyphosis, cervical region: Secondary | ICD-10-CM | POA: Diagnosis not present

## 2018-04-24 DIAGNOSIS — M5442 Lumbago with sciatica, left side: Secondary | ICD-10-CM | POA: Diagnosis not present

## 2018-04-24 DIAGNOSIS — M40292 Other kyphosis, cervical region: Secondary | ICD-10-CM | POA: Diagnosis not present

## 2018-04-24 DIAGNOSIS — M50322 Other cervical disc degeneration at C5-C6 level: Secondary | ICD-10-CM | POA: Diagnosis not present

## 2018-04-24 DIAGNOSIS — M50323 Other cervical disc degeneration at C6-C7 level: Secondary | ICD-10-CM | POA: Diagnosis not present

## 2018-04-29 DIAGNOSIS — M50323 Other cervical disc degeneration at C6-C7 level: Secondary | ICD-10-CM | POA: Diagnosis not present

## 2018-04-29 DIAGNOSIS — M5442 Lumbago with sciatica, left side: Secondary | ICD-10-CM | POA: Diagnosis not present

## 2018-04-29 DIAGNOSIS — M40292 Other kyphosis, cervical region: Secondary | ICD-10-CM | POA: Diagnosis not present

## 2018-04-29 DIAGNOSIS — M50322 Other cervical disc degeneration at C5-C6 level: Secondary | ICD-10-CM | POA: Diagnosis not present

## 2018-05-01 DIAGNOSIS — M5442 Lumbago with sciatica, left side: Secondary | ICD-10-CM | POA: Diagnosis not present

## 2018-05-01 DIAGNOSIS — M40292 Other kyphosis, cervical region: Secondary | ICD-10-CM | POA: Diagnosis not present

## 2018-05-01 DIAGNOSIS — M50322 Other cervical disc degeneration at C5-C6 level: Secondary | ICD-10-CM | POA: Diagnosis not present

## 2018-05-01 DIAGNOSIS — M50323 Other cervical disc degeneration at C6-C7 level: Secondary | ICD-10-CM | POA: Diagnosis not present

## 2018-05-02 DIAGNOSIS — M50322 Other cervical disc degeneration at C5-C6 level: Secondary | ICD-10-CM | POA: Diagnosis not present

## 2018-05-02 DIAGNOSIS — M5442 Lumbago with sciatica, left side: Secondary | ICD-10-CM | POA: Diagnosis not present

## 2018-05-02 DIAGNOSIS — M40292 Other kyphosis, cervical region: Secondary | ICD-10-CM | POA: Diagnosis not present

## 2018-05-02 DIAGNOSIS — M50323 Other cervical disc degeneration at C6-C7 level: Secondary | ICD-10-CM | POA: Diagnosis not present

## 2018-05-06 DIAGNOSIS — M5442 Lumbago with sciatica, left side: Secondary | ICD-10-CM | POA: Diagnosis not present

## 2018-05-06 DIAGNOSIS — M50322 Other cervical disc degeneration at C5-C6 level: Secondary | ICD-10-CM | POA: Diagnosis not present

## 2018-05-06 DIAGNOSIS — M40292 Other kyphosis, cervical region: Secondary | ICD-10-CM | POA: Diagnosis not present

## 2018-05-06 DIAGNOSIS — M50323 Other cervical disc degeneration at C6-C7 level: Secondary | ICD-10-CM | POA: Diagnosis not present

## 2018-05-07 ENCOUNTER — Encounter: Payer: Self-pay | Admitting: Internal Medicine

## 2018-05-08 DIAGNOSIS — M40292 Other kyphosis, cervical region: Secondary | ICD-10-CM | POA: Diagnosis not present

## 2018-05-08 DIAGNOSIS — M50323 Other cervical disc degeneration at C6-C7 level: Secondary | ICD-10-CM | POA: Diagnosis not present

## 2018-05-08 DIAGNOSIS — M50322 Other cervical disc degeneration at C5-C6 level: Secondary | ICD-10-CM | POA: Diagnosis not present

## 2018-05-08 DIAGNOSIS — M5442 Lumbago with sciatica, left side: Secondary | ICD-10-CM | POA: Diagnosis not present

## 2018-05-10 DIAGNOSIS — M5442 Lumbago with sciatica, left side: Secondary | ICD-10-CM | POA: Diagnosis not present

## 2018-05-10 DIAGNOSIS — M40292 Other kyphosis, cervical region: Secondary | ICD-10-CM | POA: Diagnosis not present

## 2018-05-10 DIAGNOSIS — M50322 Other cervical disc degeneration at C5-C6 level: Secondary | ICD-10-CM | POA: Diagnosis not present

## 2018-05-10 DIAGNOSIS — M50323 Other cervical disc degeneration at C6-C7 level: Secondary | ICD-10-CM | POA: Diagnosis not present

## 2018-05-13 DIAGNOSIS — M5442 Lumbago with sciatica, left side: Secondary | ICD-10-CM | POA: Diagnosis not present

## 2018-05-13 DIAGNOSIS — M40292 Other kyphosis, cervical region: Secondary | ICD-10-CM | POA: Diagnosis not present

## 2018-05-13 DIAGNOSIS — M50322 Other cervical disc degeneration at C5-C6 level: Secondary | ICD-10-CM | POA: Diagnosis not present

## 2018-05-13 DIAGNOSIS — M50323 Other cervical disc degeneration at C6-C7 level: Secondary | ICD-10-CM | POA: Diagnosis not present

## 2018-05-15 DIAGNOSIS — M40292 Other kyphosis, cervical region: Secondary | ICD-10-CM | POA: Diagnosis not present

## 2018-05-15 DIAGNOSIS — M50323 Other cervical disc degeneration at C6-C7 level: Secondary | ICD-10-CM | POA: Diagnosis not present

## 2018-05-15 DIAGNOSIS — M5442 Lumbago with sciatica, left side: Secondary | ICD-10-CM | POA: Diagnosis not present

## 2018-05-15 DIAGNOSIS — M50322 Other cervical disc degeneration at C5-C6 level: Secondary | ICD-10-CM | POA: Diagnosis not present

## 2018-05-17 DIAGNOSIS — M40292 Other kyphosis, cervical region: Secondary | ICD-10-CM | POA: Diagnosis not present

## 2018-05-17 DIAGNOSIS — M50322 Other cervical disc degeneration at C5-C6 level: Secondary | ICD-10-CM | POA: Diagnosis not present

## 2018-05-17 DIAGNOSIS — M5442 Lumbago with sciatica, left side: Secondary | ICD-10-CM | POA: Diagnosis not present

## 2018-05-17 DIAGNOSIS — M50323 Other cervical disc degeneration at C6-C7 level: Secondary | ICD-10-CM | POA: Diagnosis not present

## 2018-05-20 DIAGNOSIS — M50323 Other cervical disc degeneration at C6-C7 level: Secondary | ICD-10-CM | POA: Diagnosis not present

## 2018-05-20 DIAGNOSIS — M50322 Other cervical disc degeneration at C5-C6 level: Secondary | ICD-10-CM | POA: Diagnosis not present

## 2018-05-20 DIAGNOSIS — M40292 Other kyphosis, cervical region: Secondary | ICD-10-CM | POA: Diagnosis not present

## 2018-05-20 DIAGNOSIS — M5442 Lumbago with sciatica, left side: Secondary | ICD-10-CM | POA: Diagnosis not present

## 2018-05-21 ENCOUNTER — Other Ambulatory Visit: Payer: Self-pay

## 2018-05-21 ENCOUNTER — Encounter: Payer: Self-pay | Admitting: Internal Medicine

## 2018-05-21 ENCOUNTER — Ambulatory Visit: Payer: Federal, State, Local not specified - PPO | Admitting: Internal Medicine

## 2018-05-21 VITALS — BP 114/72 | HR 95 | Temp 98.2°F | Ht 63.0 in | Wt 163.4 lb

## 2018-05-21 DIAGNOSIS — E1140 Type 2 diabetes mellitus with diabetic neuropathy, unspecified: Secondary | ICD-10-CM | POA: Diagnosis not present

## 2018-05-21 DIAGNOSIS — Z23 Encounter for immunization: Secondary | ICD-10-CM

## 2018-05-21 DIAGNOSIS — I10 Essential (primary) hypertension: Secondary | ICD-10-CM

## 2018-05-21 DIAGNOSIS — Z794 Long term (current) use of insulin: Secondary | ICD-10-CM | POA: Diagnosis not present

## 2018-05-21 LAB — HEMOGLOBIN A1C
Est. average glucose Bld gHb Est-mCnc: 186 mg/dL
Hgb A1c MFr Bld: 8.1 % — ABNORMAL HIGH (ref 4.8–5.6)

## 2018-05-21 MED ORDER — FREESTYLE LIBRE 14 DAY SENSOR MISC: 1.0000 | Freq: Four times a day (QID) | 2 refills | Status: DC

## 2018-05-21 MED ORDER — PNEUMOCOCCAL 13-VAL CONJ VACC IM SUSP: 0.5000 mL | INTRAMUSCULAR | 0 refills | Status: AC

## 2018-05-21 NOTE — Patient Instructions (Signed)
Diabetes Mellitus and Nutrition, Adult  When you have diabetes (diabetes mellitus), it is very important to have healthy eating habits because your blood sugar (glucose) levels are greatly affected by what you eat and drink. Eating healthy foods in the appropriate amounts, at about the same times every day, can help you:  · Control your blood glucose.  · Lower your risk of heart disease.  · Improve your blood pressure.  · Reach or maintain a healthy weight.  Every person with diabetes is different, and each person has different needs for a meal plan. Your health care provider may recommend that you work with a diet and nutrition specialist (dietitian) to make a meal plan that is best for you. Your meal plan may vary depending on factors such as:  · The calories you need.  · The medicines you take.  · Your weight.  · Your blood glucose, blood pressure, and cholesterol levels.  · Your activity level.  · Other health conditions you have, such as heart or kidney disease.  How do carbohydrates affect me?  Carbohydrates, also called carbs, affect your blood glucose level more than any other type of food. Eating carbs naturally raises the amount of glucose in your blood. Carb counting is a method for keeping track of how many carbs you eat. Counting carbs is important to keep your blood glucose at a healthy level, especially if you use insulin or take certain oral diabetes medicines.  It is important to know how many carbs you can safely have in each meal. This is different for every person. Your dietitian can help you calculate how many carbs you should have at each meal and for each snack.  Foods that contain carbs include:  · Bread, cereal, rice, pasta, and crackers.  · Potatoes and corn.  · Peas, beans, and lentils.  · Milk and yogurt.  · Fruit and juice.  · Desserts, such as cakes, cookies, ice cream, and candy.  How does alcohol affect me?  Alcohol can cause a sudden decrease in blood glucose (hypoglycemia),  especially if you use insulin or take certain oral diabetes medicines. Hypoglycemia can be a life-threatening condition. Symptoms of hypoglycemia (sleepiness, dizziness, and confusion) are similar to symptoms of having too much alcohol.  If your health care provider says that alcohol is safe for you, follow these guidelines:  · Limit alcohol intake to no more than 1 drink per day for nonpregnant women and 2 drinks per day for men. One drink equals 12 oz of beer, 5 oz of wine, or 1½ oz of hard liquor.  · Do not drink on an empty stomach.  · Keep yourself hydrated with water, diet soda, or unsweetened iced tea.  · Keep in mind that regular soda, juice, and other mixers may contain a lot of sugar and must be counted as carbs.  What are tips for following this plan?    Reading food labels  · Start by checking the serving size on the "Nutrition Facts" label of packaged foods and drinks. The amount of calories, carbs, fats, and other nutrients listed on the label is based on one serving of the item. Many items contain more than one serving per package.  · Check the total grams (g) of carbs in one serving. You can calculate the number of servings of carbs in one serving by dividing the total carbs by 15. For example, if a food has 30 g of total carbs, it would be equal to 2   servings of carbs.  · Check the number of grams (g) of saturated and trans fats in one serving. Choose foods that have low or no amount of these fats.  · Check the number of milligrams (mg) of salt (sodium) in one serving. Most people should limit total sodium intake to less than 2,300 mg per day.  · Always check the nutrition information of foods labeled as "low-fat" or "nonfat". These foods may be higher in added sugar or refined carbs and should be avoided.  · Talk to your dietitian to identify your daily goals for nutrients listed on the label.  Shopping  · Avoid buying canned, premade, or processed foods. These foods tend to be high in fat, sodium,  and added sugar.  · Shop around the outside edge of the grocery store. This includes fresh fruits and vegetables, bulk grains, fresh meats, and fresh dairy.  Cooking  · Use low-heat cooking methods, such as baking, instead of high-heat cooking methods like deep frying.  · Cook using healthy oils, such as olive, canola, or sunflower oil.  · Avoid cooking with butter, cream, or high-fat meats.  Meal planning  · Eat meals and snacks regularly, preferably at the same times every day. Avoid going long periods of time without eating.  · Eat foods high in fiber, such as fresh fruits, vegetables, beans, and whole grains. Talk to your dietitian about how many servings of carbs you can eat at each meal.  · Eat 4-6 ounces (oz) of lean protein each day, such as lean meat, chicken, fish, eggs, or tofu. One oz of lean protein is equal to:  ? 1 oz of meat, chicken, or fish.  ? 1 egg.  ? ¼ cup of tofu.  · Eat some foods each day that contain healthy fats, such as avocado, nuts, seeds, and fish.  Lifestyle  · Check your blood glucose regularly.  · Exercise regularly as told by your health care provider. This may include:  ? 150 minutes of moderate-intensity or vigorous-intensity exercise each week. This could be brisk walking, biking, or water aerobics.  ? Stretching and doing strength exercises, such as yoga or weightlifting, at least 2 times a week.  · Take medicines as told by your health care provider.  · Do not use any products that contain nicotine or tobacco, such as cigarettes and e-cigarettes. If you need help quitting, ask your health care provider.  · Work with a counselor or diabetes educator to identify strategies to manage stress and any emotional and social challenges.  Questions to ask a health care provider  · Do I need to meet with a diabetes educator?  · Do I need to meet with a dietitian?  · What number can I call if I have questions?  · When are the best times to check my blood glucose?  Where to find more  information:  · American Diabetes Association: diabetes.org  · Academy of Nutrition and Dietetics: www.eatright.org  · National Institute of Diabetes and Digestive and Kidney Diseases (NIH): www.niddk.nih.gov  Summary  · A healthy meal plan will help you control your blood glucose and maintain a healthy lifestyle.  · Working with a diet and nutrition specialist (dietitian) can help you make a meal plan that is best for you.  · Keep in mind that carbohydrates (carbs) and alcohol have immediate effects on your blood glucose levels. It is important to count carbs and to use alcohol carefully.  This information is not intended to   replace advice given to you by your health care provider. Make sure you discuss any questions you have with your health care provider.  Document Released: 02/09/2005 Document Revised: 12/13/2016 Document Reviewed: 06/19/2016  Elsevier Interactive Patient Education © 2019 [employer] Inc[employer]

## 2018-05-25 ENCOUNTER — Encounter: Payer: Self-pay | Admitting: Internal Medicine

## 2018-05-25 NOTE — Progress Notes (Signed)
Subjective:     Patient ID: Sylvia Kelly , female    DOB: [birth year] , 62 y.o.   MRN: [id number]   Chief Complaint  Patient presents with  . Diabetes  . Hypertension    HPI  She was started on Ozempic at her last visit. She has not had any issues with the medication. She is now taking 0.5mg  once weekly. She has noticed a decrease in her appetite. Reports she lowered insulin dose due to low bs in am, states they were in the 70s.   Diabetes  She presents for her follow-up diabetic visit. She has type 2 diabetes mellitus. Her disease course has been improving. There are no hypoglycemic associated symptoms. Pertinent negatives for diabetes include no blurred vision, no chest pain and no fatigue. There are no hypoglycemic complications. Diabetic complications include peripheral neuropathy. Risk factors for coronary artery disease include diabetes mellitus, dyslipidemia, sedentary lifestyle, hypertension and post-menopausal.  Hypertension  This is a chronic problem. The current episode started more than 1 year ago. The problem has been gradually improving since onset. Pertinent negatives include no blurred vision, chest pain, palpitations or shortness of breath.  She reports compliance with meds.    Past Medical History:  Diagnosis Date  . Atrophic vaginitis   . Diabetes mellitus   . Elevated cholesterol   . Hypertension      Family History  Problem Relation Age of Onset  . Heart disease Mother   . Diabetes Mother   . Cancer Father        Lung cancer     Current Outpatient Medications:  .  aspirin 81 MG tablet, Take 81 mg by mouth daily.  , Disp: , Rfl:  .  atorvastatin (LIPITOR) 80 MG tablet, Take 80 mg by mouth daily.  , Disp: , Rfl:  .  Continuous Blood Gluc Sensor (FREESTYLE LIBRE 14 DAY SENSOR) MISC, Inject 1 each into the skin 4 (four) times daily., Disp: 3 each, Rfl: 2 .  ibuprofen (ADVIL,MOTRIN) 800 MG tablet, Take 1 tablet (800 mg total) by mouth 3 (three) times  daily., Disp: 21 tablet, Rfl: 0 .  JANUMET 50-1000 MG tablet, Take 1 tablet by mouth 2 (two) times daily with a meal., Disp: 60 tablet, Rfl: 5 .  lisinopril-hydrochlorothiazide (PRINZIDE,ZESTORETIC) 20-25 MG tablet, TAKE 1 TABLET BY MOUTH EVERY DAY, Disp: 90 tablet, Rfl: 1 .  Multiple Vitamin (MULTIVITAMIN) tablet, Take 1 tablet by mouth daily.  , Disp: , Rfl:  .  Semaglutide,0.25 or 0.5MG /DOS, (OZEMPIC, 0.25 OR 0.5 MG/DOSE,) 2 MG/1.5ML SOPN, Inject 0.5 mg into the skin., Disp: , Rfl:  .  TRESIBA FLEXTOUCH 200 UNIT/ML SOPN, INJECT 44 UNITS BY SUDCUTANEOUS ROUTE AS PER INSULIN PROTOCAL (Patient taking differently: 36 Units. ), Disp: 9 pen, Rfl: 0   No Known Allergies   Review of Systems  Constitutional: Negative.  Negative for fatigue.  Eyes: Negative for blurred vision.  Respiratory: Negative.  Negative for shortness of breath.   Cardiovascular: Negative.  Negative for chest pain and palpitations.  Gastrointestinal: Negative.   Neurological: Negative.   Psychiatric/Behavioral: Negative.      Today's Vitals   05/21/18 1139  BP: 114/72  Pulse: 95  Temp: 98.2 F (36.8 C)  TempSrc: Oral  Weight: 163 lb 6.4 oz (74.1 kg)  Height: 5\' 3"  (1.6 m)   Body mass index is 28.95 kg/m.   Objective:  Physical Exam Vitals signs reviewed.  Constitutional:      Appearance: Normal appearance.  HENT:  Head: Normocephalic and atraumatic.  Cardiovascular:     Rate and Rhythm: Normal rate and regular rhythm.     Heart sounds: Normal heart sounds.  Pulmonary:     Effort: Pulmonary effort is normal.     Breath sounds: Normal breath sounds.  Skin:    General: Skin is warm.  Neurological:     General: No focal deficit present.     Mental Status: She is alert.  Psychiatric:        Mood and Affect: Mood normal.         Assessment And Plan:     1. Type 2 diabetes mellitus with diabetic neuropathy, with long-term current use of insulin (Sylvia Kelly)  Patient's meds were reviewed. She reports  still taking Janumet. She was reminded that she was to stop the Janumet when she started the Ozempic. She was advised to stop the Janumet immediately. Pt advised that her sugars will likely go up some, so she may need to return to previous dose of insulin. She is encouraged to exercise no less than 30 minutes five days weekly. She will rto in 2 months for re-evaluation.   - Hemoglobin A1c  2. Essential (primary) hypertension  Well controlled. She will continue with current meds. She is encouraged to avoid adding salt to her foods.   3. Need for vaccination  Rx [id number] was sent to the pharmacy. She is encouraged to email on the date she gets the injection, so that her chart can be updated accordingly.   Sylvia Greenland, MD

## 2018-05-26 NOTE — Progress Notes (Signed)
Here are your lab results:  Your hba1c is 8.1, down from 9.1. This is great! Your goal a1c is less than 7.5, you are getting close!   Happy new year!  Sincerely,    Asier Desroches N. [facility], MD

## 2018-05-31 ENCOUNTER — Other Ambulatory Visit: Payer: Self-pay

## 2018-05-31 MED ORDER — SEMAGLUTIDE(0.25 OR 0.5MG/DOS) 2 MG/1.5ML ~~LOC~~ SOPN: 0.5000 mg | PEN_INJECTOR | SUBCUTANEOUS | 2 refills | Status: DC

## 2018-06-05 ENCOUNTER — Encounter (HOSPITAL_COMMUNITY): Payer: Self-pay

## 2018-06-05 ENCOUNTER — Emergency Department (HOSPITAL_COMMUNITY)
Admission: EM | Admit: 2018-06-05 | Discharge: 2018-06-05 | Disposition: A | Discharge status: Home / Self Care | Payer: Federal, State, Local not specified - PPO | Attending: Emergency Medicine | Admitting: Emergency Medicine

## 2018-06-05 DIAGNOSIS — Y939 Activity, unspecified: Secondary | ICD-10-CM | POA: Insufficient documentation

## 2018-06-05 DIAGNOSIS — S39012A Strain of muscle, fascia and tendon of lower back, initial encounter: Secondary | ICD-10-CM | POA: Insufficient documentation

## 2018-06-05 DIAGNOSIS — Y999 Unspecified external cause status: Secondary | ICD-10-CM | POA: Diagnosis not present

## 2018-06-05 DIAGNOSIS — E119 Type 2 diabetes mellitus without complications: Secondary | ICD-10-CM | POA: Insufficient documentation

## 2018-06-05 DIAGNOSIS — I10 Essential (primary) hypertension: Secondary | ICD-10-CM | POA: Diagnosis not present

## 2018-06-05 DIAGNOSIS — X501XXA Overexertion from prolonged static or awkward postures, initial encounter: Secondary | ICD-10-CM | POA: Insufficient documentation

## 2018-06-05 DIAGNOSIS — Y929 Unspecified place or not applicable: Secondary | ICD-10-CM | POA: Insufficient documentation

## 2018-06-05 DIAGNOSIS — Z79899 Other long term (current) drug therapy: Secondary | ICD-10-CM | POA: Insufficient documentation

## 2018-06-05 DIAGNOSIS — Z7982 Long term (current) use of aspirin: Secondary | ICD-10-CM | POA: Insufficient documentation

## 2018-06-05 MED ORDER — METHOCARBAMOL 500 MG PO TABS: 500.0000 mg | ORAL_TABLET | Freq: Two times a day (BID) | ORAL | 0 refills | Status: DC

## 2018-06-05 MED ORDER — MELOXICAM 15 MG PO TABS: 15.0000 mg | ORAL_TABLET | Freq: Every day | ORAL | 0 refills | Status: DC

## 2018-06-05 NOTE — ED Triage Notes (Signed)
Pt reports she was running to avoid a gas tank from falling on her and thinks she pulled a muscle in her back. Pt ambulatory

## 2018-06-05 NOTE — ED Provider Notes (Signed)
[facility] Provider Note   CSN: [id number] Arrival date & time: 06/05/18  1923     History   Chief Complaint Chief Complaint  Patient presents with  . Back Pain    HPI Sylvia Kelly is a 63 y.o. female with a past medical history of diabetes and hypertension who presents to the emergency department chief complaint of low back pain.  Injured her back earlier today when a gas tank fell down and she had to twist and run away.  She did not have immediate pain but about an hour later began having pain in her left low back.  The pain is located in that region and does not radiate.  She describes it as aching, worse with certain movements, better with rest.  She denies any numbness, tingling, loss of bowel or bladder control, weakness or numbness.  She has no previous history of back injuries.  HPI  Past Medical History:  Diagnosis Date  . Atrophic vaginitis   . Diabetes mellitus   . Elevated cholesterol   . Hypertension     Patient Active Problem List   Diagnosis Date Noted  . Female climacteric state 01/04/2017  . Other long term (current) drug therapy 01/04/2017  . Essential (primary) hypertension   . Type 2 diabetes mellitus with diabetic neuropathy, unspecified ([place])   . Elevated cholesterol     Past Surgical History:  Procedure Laterality Date  . OVARIAN CYST SURGERY  1992  . REFRACTIVE SURGERY    . TUBAL LIGATION       OB History    Gravida  2   Para  1   Term  1   Preterm      AB  1   Living  1     SAB      TAB      Ectopic      Multiple      Live Births               Home Medications    Prior to Admission medications   Medication Sig Start Date End Date Taking? Authorizing Provider  aspirin 81 MG tablet Take 81 mg by mouth daily.      [provider]  atorvastatin (LIPITOR) 80 MG tablet Take 80 mg by mouth daily.      [provider]  Continuous Blood Gluc Sensor (FREESTYLE  LIBRE 14 DAY SENSOR) MISC Inject 1 each into the skin 4 (four) times daily. 05/21/18   Glendale Chard, MD  ibuprofen (ADVIL,MOTRIN) 800 MG tablet Take 1 tablet (800 mg total) by mouth 3 (three) times daily. 04/10/15   Domenic Moras, PA-C  JANUMET 50-1000 MG tablet Take 1 tablet by mouth 2 (two) times daily with a meal. 04/05/18 10/02/18  Glendale Chard, MD  lisinopril-hydrochlorothiazide (PRINZIDE,ZESTORETIC) 20-25 MG tablet TAKE 1 TABLET BY MOUTH EVERY DAY 02/28/18   Glendale Chard, MD  Multiple Vitamin (MULTIVITAMIN) tablet Take 1 tablet by mouth daily.      [provider]  Semaglutide,0.25 or 0.5MG /DOS, (OZEMPIC, 0.25 OR 0.5 MG/DOSE,) 2 MG/1.5ML SOPN Inject 0.5 mg into the skin once a week. 05/31/18   Glendale Chard, MD  TRESIBA FLEXTOUCH 200 UNIT/ML SOPN INJECT 44 UNITS BY SUDCUTANEOUS ROUTE AS PER INSULIN PROTOCAL Patient taking differently: 36 Units.  03/21/18   Minette Brine, FNP    Family History Family History  Problem Relation Age of Onset  . Heart disease Mother   . Diabetes Mother   .  Cancer Father        Lung cancer    Social History Social History   Tobacco Use  . Smoking status: Never Smoker  . Smokeless tobacco: Never Used  Substance Use Topics  . Alcohol use: No  . Drug use: No     Allergies   Patient has no known allergies.   Review of Systems Review of Systems  Genitourinary: Negative.   Musculoskeletal: Positive for back pain.  Skin: Negative for wound.  Neurological: Negative for weakness and numbness.     Physical Exam Updated Vital Signs BP 108/83   Pulse 84   Temp 98.4 F (36.9 C) (Oral)   Resp 16   SpO2 100%   Physical Exam Vitals signs and nursing note reviewed.  Constitutional:      General: She is not in acute distress.    Appearance: She is well-developed. She is not diaphoretic.  HENT:     Head: Normocephalic and atraumatic.  Eyes:     General: No scleral icterus.    Conjunctiva/sclera: Conjunctivae normal.  Neck:      Musculoskeletal: Normal range of motion.  Cardiovascular:     Rate and Rhythm: Normal rate and regular rhythm.     Heart sounds: Normal heart sounds. No murmur. No friction rub. No gallop.   Pulmonary:     Effort: Pulmonary effort is normal. No respiratory distress.     Breath sounds: Normal breath sounds. No wheezing.  Abdominal:     General: Bowel sounds are normal. There is no distension.     Palpations: Abdomen is soft. There is no mass.     Tenderness: There is no abdominal tenderness. There is no guarding.  Musculoskeletal:     Comments: Lower back examination reveals no midline spinal tenderness.  Full range of motion, mild pain with left lateral flexion.  Out stiffness with forward flexion.  Normal lower extremity strength and 2+ bilateral patellar reflexes.   2+ DP/PT pulses in the feet with normal sensation.  Skin:    General: Skin is warm and dry.  Neurological:     Mental Status: She is alert and oriented to person, place, and time.  Psychiatric:        Behavior: Behavior normal.      ED Treatments / Results  Labs (all labs ordered are listed, but only abnormal results are displayed) Labs Reviewed - No data to display  EKG None  Radiology No results found.  Procedures Procedures (including critical care time)  Medications Ordered in ED Medications - No data to display   Initial Impression / Assessment and Plan / ED Course  I have reviewed the triage vital signs and the nursing notes.  Pertinent labs & imaging results that were available during my care of the patient were reviewed by me and considered in my medical decision making (see chart for details).     Patient with back pain.  No neurological deficits and normal neuro exam.  Patient can walk but states is painful.  No loss of bowel or bladder control.  No concern for cauda equina.  No fever, night sweats, weight loss, h/o cancer, IVDU.  RICE protocol and pain medicine indicated and discussed with  patient.    Final Clinical Impressions(s) / ED Diagnoses   Final diagnoses:  None    ED Discharge Orders    None       Margarita Mail, PA-C 06/05/18 2232    Dorie Rank, MD 06/06/18 [phone]

## 2018-06-05 NOTE — ED Notes (Signed)
Patient verbalizes understanding of discharge instructions. Opportunity for questioning and answers were provided. Armband removed by staff, pt discharged from ED ambulatory.   

## 2018-06-05 NOTE — Discharge Instructions (Signed)
SEEK IMMEDIATE MEDICAL ATTENTION IF: New numbness, tingling, weakness, or problem with the use of your arms or legs.  Severe back pain not relieved with medications.  Change in bowel or bladder control.  Increasing pain in any areas of the body (such as chest or abdominal pain).  Shortness of breath, dizziness or fainting.  Nausea (feeling sick to your stomach), vomiting, fever, or sweats.  

## 2018-06-05 NOTE — ED Notes (Signed)
The pt is c/o lt flank and or back pain after she twisted it yesterday

## 2018-07-16 ENCOUNTER — Other Ambulatory Visit: Payer: Self-pay | Admitting: Internal Medicine

## 2018-07-16 ENCOUNTER — Ambulatory Visit (INDEPENDENT_AMBULATORY_CARE_PROVIDER_SITE_OTHER): Payer: Federal, State, Local not specified - PPO | Admitting: Internal Medicine

## 2018-07-16 ENCOUNTER — Encounter: Payer: Self-pay | Admitting: Internal Medicine

## 2018-07-16 VITALS — BP 132/82 | HR 77 | Temp 97.7°F | Ht 63.8 in | Wt 163.0 lb

## 2018-07-16 DIAGNOSIS — R350 Frequency of micturition: Secondary | ICD-10-CM

## 2018-07-16 DIAGNOSIS — E1140 Type 2 diabetes mellitus with diabetic neuropathy, unspecified: Secondary | ICD-10-CM

## 2018-07-16 DIAGNOSIS — R600 Localized edema: Secondary | ICD-10-CM

## 2018-07-16 DIAGNOSIS — Z794 Long term (current) use of insulin: Secondary | ICD-10-CM

## 2018-07-16 DIAGNOSIS — I10 Essential (primary) hypertension: Secondary | ICD-10-CM

## 2018-07-16 LAB — POCT URINALYSIS DIPSTICK
Bilirubin, UA: NEGATIVE
Blood, UA: NEGATIVE
Glucose, UA: POSITIVE — AB
Ketones, UA: NEGATIVE
Leukocytes, UA: NEGATIVE
Nitrite, UA: NEGATIVE
Protein, UA: NEGATIVE
Spec Grav, UA: 1.01 (ref 1.010–1.025)
Urobilinogen, UA: 0.2 E.U./dL
pH, UA: 5.5 (ref 5.0–8.0)

## 2018-07-16 MED ORDER — INSULIN DEGLUDEC 200 UNIT/ML ~~LOC~~ SOPN: 50.0000 [IU] | PEN_INJECTOR | Freq: Every day | SUBCUTANEOUS | 1 refills | Status: DC

## 2018-07-16 MED ORDER — BENZONATATE 100 MG PO CAPS: 100.0000 mg | ORAL_CAPSULE | Freq: Four times a day (QID) | ORAL | 1 refills | Status: AC | PRN

## 2018-07-16 NOTE — Progress Notes (Signed)
Subjective:     Patient ID: Sylvia Kelly , female    DOB: 1955-12-25 , 63 y.o.   MRN: [id number]   Chief Complaint  Patient presents with  . Diabetes  . Hypertension    HPI  Diabetes  She presents for her follow-up diabetic visit. She has type 2 diabetes mellitus. Her disease course has been fluctuating. There are no hypoglycemic associated symptoms. Pertinent negatives for diabetes include no blurred vision and no chest pain. There are no hypoglycemic complications. Diabetic complications include peripheral neuropathy. Risk factors for coronary artery disease include dyslipidemia, diabetes mellitus, hypertension, post-menopausal and sedentary lifestyle. Her breakfast blood glucose is taken between 9-10 am. Her breakfast blood glucose range is generally 140-180 mg/dl.  Hypertension  This is a chronic problem. The current episode started more than 1 year ago. The problem has been gradually improving since onset. The problem is controlled. Pertinent negatives include no blurred vision, chest pain, palpitations or shortness of breath.   She reports compliance with meds.   Past Medical History:  Diagnosis Date  . Atrophic vaginitis   . Diabetes mellitus   . Elevated cholesterol   . Hypertension      Family History  Problem Relation Age of Onset  . Heart disease Mother   . Diabetes Mother   . Cancer Father        Lung cancer     Current Outpatient Medications:  .  aspirin 81 MG tablet, Take 81 mg by mouth daily.  , Disp: , Rfl:  .  atorvastatin (LIPITOR) 80 MG tablet, Take 80 mg by mouth daily.  , Disp: , Rfl:  .  Continuous Blood Gluc Sensor (FREESTYLE LIBRE 14 DAY SENSOR) MISC, Inject 1 each into the skin 4 (four) times daily., Disp: 3 each, Rfl: 2 .  ibuprofen (ADVIL,MOTRIN) 800 MG tablet, Take 1 tablet (800 mg total) by mouth 3 (three) times daily., Disp: 21 tablet, Rfl: 0 .  Insulin Degludec (TRESIBA FLEXTOUCH) 200 UNIT/ML SOPN, Inject 50 Units into the skin at bedtime.,  Disp: 9 pen, Rfl: 1 .  lisinopril-hydrochlorothiazide (PRINZIDE,ZESTORETIC) 20-25 MG tablet, TAKE 1 TABLET BY MOUTH EVERY DAY, Disp: 90 tablet, Rfl: 1 .  meloxicam (MOBIC) 15 MG tablet, Take 1 tablet (15 mg total) by mouth daily. Take 1 daily with food. (Patient taking differently: Take 15 mg by mouth daily. Take 1 daily with food. Patient taking as needed), Disp: 10 tablet, Rfl: 0 .  methocarbamol (ROBAXIN) 500 MG tablet, Take 1 tablet (500 mg total) by mouth 2 (two) times daily. (Patient taking differently: Take 500 mg by mouth 2 (two) times daily. Patient taking it as needed), Disp: 20 tablet, Rfl: 0 .  Multiple Vitamin (MULTIVITAMIN) tablet, Take 1 tablet by mouth daily.  , Disp: , Rfl:  .  Semaglutide,0.25 or 0.5MG /DOS, (OZEMPIC, 0.25 OR 0.5 MG/DOSE,) 2 MG/1.5ML SOPN, Inject 0.5 mg into the skin once a week., Disp: 1 pen, Rfl: 2 .  benzonatate (TESSALON PERLES) 100 MG capsule, Take 1 capsule (100 mg total) by mouth every 6 (six) hours as needed for cough., Disp: 30 capsule, Rfl: 1   No Known Allergies   Review of Systems  Constitutional: Negative.   Eyes: Negative for blurred vision.  Respiratory: Negative.  Negative for shortness of breath.   Cardiovascular: Negative.  Negative for chest pain and palpitations.       She c/o LLE swelling. There is some pain with ambulation. Unable to determine what triggered her symptoms.   Gastrointestinal:  Negative.   Genitourinary: Positive for frequency.  Neurological: Negative.   Psychiatric/Behavioral: Negative.      Today's Vitals   07/16/18 1458  BP: 132/82  Pulse: 77  Temp: 97.7 F (36.5 C)  TempSrc: Oral  SpO2: 95%  Weight: 163 lb (73.9 kg)  Height: 5' 3.8" (1.621 m)  PainSc: 0-No pain   Body mass index is 28.15 kg/m.   Objective:  Physical Exam Vitals signs and nursing note reviewed.  Constitutional:      Appearance: Normal appearance.  HENT:     Head: Normocephalic and atraumatic.  Cardiovascular:     Rate and Rhythm:  Normal rate and regular rhythm.     Heart sounds: Normal heart sounds.  Pulmonary:     Effort: Pulmonary effort is normal.     Breath sounds: Normal breath sounds.  Musculoskeletal:     Comments: The left calf is larger than right calf. This was measured and there is about 0.5 inch difference between the two legs.      Skin:    General: Skin is warm.  Neurological:     General: No focal deficit present.     Mental Status: She is alert.  Psychiatric:        Mood and Affect: Mood normal.        Behavior: Behavior normal.         Assessment And Plan:     1. Type 2 diabetes mellitus with diabetic neuropathy, with long-term current use of insulin ([place])  I will check an a1c today. Importance of medication compliance was discussed with the patient.   2. Essential (primary) hypertension  Controlled. She will continue with current meds. She is encouraged to avoid adding salt to her foods. Importance of regular exercise was also discussed with the patient.   3. Localized edema  I will refer her for left lower extremity venous doppler to r/o DVT.   4. Urinary frequency  I will check urinalysis. However, her symptoms are likely due to hyperglycemia.   - POCT Urinalysis Dipstick (48185)        [name], MD

## 2018-07-17 ENCOUNTER — Telehealth: Payer: Self-pay

## 2018-07-17 ENCOUNTER — Ambulatory Visit
Admission: RE | Admit: 2018-07-17 | Discharge: 2018-07-17 | Disposition: A | Discharge status: Home / Self Care | Payer: Federal, State, Local not specified - PPO | Source: Ambulatory Visit | Attending: Internal Medicine | Admitting: Internal Medicine

## 2018-07-17 DIAGNOSIS — R600 Localized edema: Secondary | ICD-10-CM

## 2018-07-17 DIAGNOSIS — Z86718 Personal history of other venous thrombosis and embolism: Secondary | ICD-10-CM | POA: Diagnosis not present

## 2018-07-17 NOTE — Telephone Encounter (Signed)
Patient notified that venous doppler of her left leg was negative for a DVT.

## 2018-08-08 ENCOUNTER — Other Ambulatory Visit: Payer: Self-pay

## 2018-08-08 MED ORDER — FREESTYLE LIBRE 14 DAY SENSOR MISC: 1.0000 | Freq: Four times a day (QID) | 2 refills | Status: DC

## 2018-08-16 ENCOUNTER — Other Ambulatory Visit: Payer: Self-pay | Admitting: Internal Medicine

## 2018-08-22 ENCOUNTER — Other Ambulatory Visit: Payer: Self-pay | Admitting: Internal Medicine

## 2018-08-30 ENCOUNTER — Telehealth: Payer: Self-pay

## 2018-08-30 NOTE — Telephone Encounter (Signed)
PA SENT TO PLAN FOR FREESTYLE LIBRE-[employer]OVER[employer]MEDS

## 2018-09-02 ENCOUNTER — Other Ambulatory Visit: Payer: Self-pay | Admitting: Internal Medicine

## 2018-09-02 ENCOUNTER — Encounter: Payer: Self-pay | Admitting: Internal Medicine

## 2018-09-02 ENCOUNTER — Other Ambulatory Visit: Payer: Self-pay

## 2018-09-02 MED ORDER — SEMAGLUTIDE(0.25 OR 0.5MG/DOS) 2 MG/1.5ML ~~LOC~~ SOPN: 0.5000 mg | PEN_INJECTOR | SUBCUTANEOUS | 1 refills | Status: DC

## 2018-09-03 ENCOUNTER — Other Ambulatory Visit: Payer: Self-pay

## 2018-09-03 ENCOUNTER — Telehealth: Payer: Self-pay

## 2018-09-03 MED ORDER — SEMAGLUTIDE (1 MG/DOSE) 2 MG/1.5ML ~~LOC~~ SOPN: 1.0000 mg | PEN_INJECTOR | SUBCUTANEOUS | 1 refills | Status: DC

## 2018-09-03 NOTE — Telephone Encounter (Signed)
The patient notified that her [place] has been approved by her insurance company.

## 2018-09-17 ENCOUNTER — Telehealth: Payer: Self-pay

## 2018-09-17 NOTE — Telephone Encounter (Signed)
Pt and pharm notified of approval of freestyle libre cost to pt is $21.49

## 2018-09-23 ENCOUNTER — Encounter: Payer: Self-pay | Admitting: Internal Medicine

## 2018-09-24 ENCOUNTER — Telehealth: Payer: Self-pay

## 2018-09-24 NOTE — Telephone Encounter (Signed)
The pt was called and told that she needed an appt for a medication for the rash that she says that she has.  The pt agreed to a virtual appointment.

## 2018-09-25 ENCOUNTER — Encounter: Payer: Self-pay | Admitting: Internal Medicine

## 2018-09-25 ENCOUNTER — Ambulatory Visit: Payer: Federal, State, Local not specified - PPO | Admitting: Internal Medicine

## 2018-09-25 ENCOUNTER — Other Ambulatory Visit: Payer: Self-pay | Admitting: Internal Medicine

## 2018-09-25 ENCOUNTER — Other Ambulatory Visit: Payer: Self-pay

## 2018-09-25 VITALS — Temp 97.4°F | Ht 63.8 in

## 2018-09-25 DIAGNOSIS — L309 Dermatitis, unspecified: Secondary | ICD-10-CM

## 2018-09-25 MED ORDER — CLOTRIMAZOLE-BETAMETHASONE 1-0.05 % EX CREA: TOPICAL_CREAM | CUTANEOUS | 1 refills | Status: AC

## 2018-09-25 NOTE — Patient Instructions (Signed)

## 2018-10-08 ENCOUNTER — Encounter: Payer: Self-pay | Admitting: Internal Medicine

## 2018-10-08 NOTE — Progress Notes (Signed)
Virtual Visit via Video   This visit type was conducted due to national recommendations for restrictions regarding the COVID-19 Pandemic (e.g. social distancing) in an effort to limit this patient's exposure and mitigate transmission in our community.  Due to her co-morbid illnesses, this patient is at least at moderate risk for complications without adequate follow up.  This format is felt to be most appropriate for this patient at this time.  All issues noted in this document were discussed and addressed.  A limited physical exam was performed with this format.    This visit type was conducted due to national recommendations for restrictions regarding the COVID-19 Pandemic (e.g. social distancing) in an effort to limit this patient's exposure and mitigate transmission in our community.  Patients identity confirmed using two different identifiers.  This format is felt to be most appropriate for this patient at this time.  All issues noted in this document were discussed and addressed.  No physical exam was performed (except for noted visual exam findings with Video Visits).    Date:  10/08/2018   ID:  Lestine Box, DOB 1956/05/13, MRN [id number]  Patient Location:  Home  Provider location:   Office    Chief Complaint:  I have a rash  History of Present Illness:    MADALENA KESECKER is a 63 y.o. female who presents via video conferencing for a telehealth visit today.    The patient does not have symptoms concerning for COVID-19 infection (fever, chills, cough, or new shortness of breath).   She presents today for virtual visit. She prefers this method of contact due to COVID-19 pandemic.  Rash  This is a new problem. The current episode started 1 to 4 weeks ago. The problem is unchanged. The affected locations include the left ankle. The rash is characterized by itchiness. She was exposed to nothing. Pertinent negatives include no cough, diarrhea, fever or joint pain. Past  treatments include anti-itch cream. The treatment provided mild relief.     Past Medical History:  Diagnosis Date  . Atrophic vaginitis   . Diabetes mellitus   . Elevated cholesterol   . Hypertension    Past Surgical History:  Procedure Laterality Date  . OVARIAN CYST SURGERY  1992  . REFRACTIVE SURGERY    . TUBAL LIGATION       Current Meds  Medication Sig  . aspirin 81 MG tablet Take 81 mg by mouth daily.    Marland Kitchen atorvastatin (LIPITOR) 80 MG tablet TAKE 1 TABLET EVERY DAY  . benzonatate (TESSALON PERLES) 100 MG capsule Take 1 capsule (100 mg total) by mouth every 6 (six) hours as needed for cough.  . Continuous Blood Gluc Sensor (FREESTYLE LIBRE 14 DAY SENSOR) MISC USE AS DIRECTED 4 TIMES DAILY  . ibuprofen (ADVIL,MOTRIN) 800 MG tablet Take 1 tablet (800 mg total) by mouth 3 (three) times daily.  . Insulin Degludec (TRESIBA FLEXTOUCH) 200 UNIT/ML SOPN Inject 50 Units into the skin at bedtime. (Patient taking differently: Inject 56 Units into the skin at bedtime. )  . lisinopril-hydrochlorothiazide (PRINZIDE,ZESTORETIC) 20-25 MG tablet TAKE 1 TABLET BY MOUTH EVERY DAY (Patient taking differently: 0.5 tablets. )  . meloxicam (MOBIC) 15 MG tablet Take 1 tablet (15 mg total) by mouth daily. Take 1 daily with food. (Patient taking differently: Take 15 mg by mouth daily. Take 1 daily with food. Patient taking as needed)  . methocarbamol (ROBAXIN) 500 MG tablet Take 1 tablet (500 mg total) by mouth 2 (two)  times daily. (Patient taking differently: Take 500 mg by mouth 2 (two) times daily. Patient taking it as needed)  . Multiple Vitamin (MULTIVITAMIN) tablet Take 1 tablet by mouth daily.    Marland Kitchen OZEMPIC, 1 MG/DOSE, 2 MG/1.5ML SOPN INJECT 1 MG INTO THE SKIN ONCE A WEEK.     Allergies:   Patient has no known allergies.   Social History   Tobacco Use  . Smoking status: Never Smoker  . Smokeless tobacco: Never Used  Substance Use Topics  . Alcohol use: No  . Drug use: No     Family Hx:  The patient's family history includes Cancer in her father; Diabetes in her mother; Heart disease in her mother.  ROS:   Please see the history of present illness.    Review of Systems  Constitutional: Negative.  Negative for fever.  Respiratory: Negative.  Negative for cough.   Cardiovascular: Negative.   Gastrointestinal: Negative.  Negative for diarrhea.  Musculoskeletal: Negative for joint pain.  Skin: Positive for rash.  Neurological: Negative.   Psychiatric/Behavioral: Negative.     All other systems reviewed and are negative.   Labs/Other Tests and Data Reviewed:    Recent Labs: 10/10/2017: TSH 0.60 04/17/2018: ALT 19; BUN 14; Creatinine, Ser 0.90; Potassium 4.2; Sodium 138   Recent Lipid Panel Lab Results  Component Value Date/Time   CHOL 164 04/17/2018 04:09 PM   TRIG 123 04/17/2018 04:09 PM   HDL 56 04/17/2018 04:09 PM   CHOLHDL 2.9 04/17/2018 04:09 PM   LDLCALC 83 04/17/2018 04:09 PM    Wt Readings from Last 3 Encounters:  07/16/18 163 lb (73.9 kg)  05/21/18 163 lb 6.4 oz (74.1 kg)  04/17/18 171 lb 6.4 oz (77.7 kg)     Exam:    Vital Signs:  Temp (!) 97.4 F (36.3 C) (Oral) Comment: pt provided  Ht 5' 3.8" (1.621 m)   BMI 28.15 kg/m     Physical Exam  Constitutional: She is oriented to person, place, and time and well-developed, well-nourished, and in no distress.  HENT:  Head: Normocephalic and atraumatic.  Pulmonary/Chest: Effort normal.  Neurological: She is alert and oriented to person, place, and time.  Skin: Rash noted.  Round, raised erythematous rash near medial malleolus  Psychiatric: Affect normal.  Nursing note and vitals reviewed.   ASSESSMENT & PLAN:     1. Dermatitis  She was given rx lotrisone to apply to affected area twice daily as needed. She is encouraged to contact me next week to let me know how she is doing.     COVID-19 Education: The signs and symptoms of COVID-19 were discussed with the patient and how to seek  care for testing (follow up with PCP or arrange E-visit).  The importance of social distancing was discussed today.  Patient Risk:   After full review of this patients clinical status, I feel that they are at least moderate risk at this time.  Time:   Today, I have spent 9 minutes/ 36 seconds with the patient with telehealth technology discussing above diagnoses.     Medication Adjustments/Labs and Tests Ordered: Current medicines are reviewed at length with the patient today.  Concerns regarding medicines are outlined above.   Tests Ordered: No orders of the defined types were placed in this encounter.   Medication Changes: Meds ordered this encounter  Medications  . clotrimazole-betamethasone (LOTRISONE) cream    Sig: Apply to affected area 2 times daily prn    Dispense:  15  g    Refill:  1    Disposition:  Follow up prn  Signed, [name], MD

## 2018-10-11 ENCOUNTER — Encounter: Payer: Self-pay | Admitting: Internal Medicine

## 2018-10-29 ENCOUNTER — Other Ambulatory Visit: Payer: Self-pay | Admitting: Internal Medicine

## 2018-10-30 ENCOUNTER — Encounter: Payer: Self-pay | Admitting: Internal Medicine

## 2018-10-30 ENCOUNTER — Ambulatory Visit: Payer: Federal, State, Local not specified - PPO | Admitting: Internal Medicine

## 2018-10-30 ENCOUNTER — Other Ambulatory Visit: Payer: Self-pay

## 2018-10-30 VITALS — BP 114/76 | HR 95 | Temp 98.0°F | Ht 65.2 in | Wt 162.6 lb

## 2018-10-30 DIAGNOSIS — Z0000 Encounter for general adult medical examination without abnormal findings: Secondary | ICD-10-CM

## 2018-10-30 DIAGNOSIS — I10 Essential (primary) hypertension: Secondary | ICD-10-CM

## 2018-10-30 DIAGNOSIS — E1140 Type 2 diabetes mellitus with diabetic neuropathy, unspecified: Secondary | ICD-10-CM | POA: Diagnosis not present

## 2018-10-30 DIAGNOSIS — Z794 Long term (current) use of insulin: Secondary | ICD-10-CM

## 2018-10-30 NOTE — Patient Instructions (Signed)

## 2018-10-31 LAB — CMP14+EGFR
ALT: 15 IU/L (ref 0–32)
AST: 18 IU/L (ref 0–40)
Albumin/Globulin Ratio: 1.5 (ref 1.2–2.2)
Albumin: 4.2 g/dL (ref 3.8–4.8)
Alkaline Phosphatase: 89 IU/L (ref 39–117)
BUN/Creatinine Ratio: 18 (ref 12–28)
BUN: 18 mg/dL (ref 8–27)
Bilirubin Total: 0.4 mg/dL (ref 0.0–1.2)
CO2: 27 mmol/L (ref 20–29)
Calcium: 9.2 mg/dL (ref 8.7–10.3)
Chloride: 99 mmol/L (ref 96–106)
Creatinine, Ser: 0.98 mg/dL (ref 0.57–1.00)
GFR calc Af Amer: 71 mL/min/{1.73_m2} (ref >59)
GFR calc non Af Amer: 62 mL/min/{1.73_m2} (ref >59)
Globulin, Total: 2.8 g/dL (ref 1.5–4.5)
Glucose: 91 mg/dL (ref 65–99)
Potassium: 4.1 mmol/L (ref 3.5–5.2)
Sodium: 141 mmol/L (ref 134–144)
Total Protein: 7 g/dL (ref 6.0–8.5)

## 2018-10-31 LAB — LIPID PANEL
Chol/HDL Ratio: 2.8 ratio (ref 0.0–4.4)
Cholesterol, Total: 151 mg/dL (ref 100–199)
HDL: 54 mg/dL (ref >39)
LDL Calculated: 84 mg/dL (ref 0–99)
Triglycerides: 65 mg/dL (ref 0–149)
VLDL Cholesterol Cal: 13 mg/dL (ref 5–40)

## 2018-10-31 LAB — CBC
Hematocrit: 36.5 % (ref 34.0–46.6)
Hemoglobin: 12.3 g/dL (ref 11.1–15.9)
MCH: 29.4 pg (ref 26.6–33.0)
MCHC: 33.7 g/dL (ref 31.5–35.7)
MCV: 87 fL (ref 79–97)
Platelets: 288 10*3/uL (ref 150–450)
RBC: 4.18 x10E6/uL (ref 3.77–5.28)
RDW: 12.7 % (ref 11.7–15.4)
WBC: 4 10*3/uL (ref 3.4–10.8)

## 2018-10-31 LAB — HEMOGLOBIN A1C
Est. average glucose Bld gHb Est-mCnc: 186 mg/dL
Hgb A1c MFr Bld: 8.1 % — ABNORMAL HIGH (ref 4.8–5.6)

## 2018-11-03 NOTE — Progress Notes (Signed)
Subjective:     Patient ID: Sylvia Kelly , female    DOB: [birth year] , 63 y.o.   MRN: [id number]   Chief Complaint  Patient presents with  . Annual Exam  . Diabetes  . Hypertension    HPI  She is here today for a full physical examination. She is followed by GYN for her pelvic exams. She has no specific concerns or complaints at this time.   Diabetes  She presents for her follow-up diabetic visit. She has type 2 diabetes mellitus. Her disease course has been fluctuating. There are no hypoglycemic associated symptoms. Pertinent negatives for diabetes include no blurred vision and no chest pain. There are no hypoglycemic complications. Diabetic complications include peripheral neuropathy. Risk factors for coronary artery disease include dyslipidemia, diabetes mellitus, hypertension, post-menopausal and sedentary lifestyle. She is following a diabetic diet. She participates in exercise intermittently. Her breakfast blood glucose is taken between 9-10 am. Her breakfast blood glucose range is generally 140-180 mg/dl.  Hypertension  This is a chronic problem. The current episode started more than 1 year ago. The problem has been gradually improving since onset. The problem is controlled. Pertinent negatives include no blurred vision, chest pain, palpitations or shortness of breath. Risk factors for coronary artery disease include diabetes mellitus, dyslipidemia and sedentary lifestyle. Past treatments include ACE inhibitors, angiotensin blockers and diuretics. The current treatment provides moderate improvement.     Past Medical History:  Diagnosis Date  . Atrophic vaginitis   . Diabetes mellitus   . Elevated cholesterol   . Hypertension      Family History  Problem Relation Age of Onset  . Heart disease Mother   . Diabetes Mother   . Cancer Father        Lung cancer     Current Outpatient Medications:  .  aspirin 81 MG tablet, Take 81 mg by mouth daily.  , Disp: , Rfl:  .   atorvastatin (LIPITOR) 80 MG tablet, TAKE 1 TABLET EVERY DAY, Disp: 90 tablet, Rfl: 1 .  B-D UF III MINI PEN NEEDLES 31G X 5 MM MISC, USE AS DIRECTED WITH LEVEMIR, Disp: 150 each, Rfl: 3 .  benzonatate (TESSALON PERLES) 100 MG capsule, Take 1 capsule (100 mg total) by mouth every 6 (six) hours as needed for cough., Disp: 30 capsule, Rfl: 1 .  Continuous Blood Gluc Sensor (FREESTYLE LIBRE 14 DAY SENSOR) MISC, USE AS DIRECTED 4 TIMES DAILY, Disp: 2 each, Rfl: 2 .  ibuprofen (ADVIL,MOTRIN) 800 MG tablet, Take 1 tablet (800 mg total) by mouth 3 (three) times daily., Disp: 21 tablet, Rfl: 0 .  Insulin Degludec (TRESIBA FLEXTOUCH) 200 UNIT/ML SOPN, Inject 50 Units into the skin at bedtime. (Patient taking differently: Inject 56 Units into the skin at bedtime. ), Disp: 9 pen, Rfl: 1 .  lisinopril-hydrochlorothiazide (PRINZIDE,ZESTORETIC) 20-25 MG tablet, TAKE 1 TABLET BY MOUTH EVERY DAY (Patient taking differently: 0.5 tablets. ), Disp: 90 tablet, Rfl: 1 .  meloxicam (MOBIC) 15 MG tablet, Take 1 tablet (15 mg total) by mouth daily. Take 1 daily with food. (Patient taking differently: Take 15 mg by mouth daily. Take 1 daily with food. Patient taking as needed), Disp: 10 tablet, Rfl: 0 .  methocarbamol (ROBAXIN) 500 MG tablet, Take 1 tablet (500 mg total) by mouth 2 (two) times daily. (Patient taking differently: Take 500 mg by mouth 2 (two) times daily. Patient taking it as needed), Disp: 20 tablet, Rfl: 0 .  Multiple Vitamin (MULTIVITAMIN) tablet, Take 1  tablet by mouth daily.  , Disp: , Rfl:  .  OZEMPIC, 1 MG/DOSE, 2 MG/1.5ML SOPN, INJECT 1 MG INTO THE SKIN ONCE A WEEK., Disp: 3 pen, Rfl: 3   No Known Allergies    The patient states she uses none for birth control. Last LMP was No LMP recorded. Patient is postmenopausal.. Negative for Dysmenorrhea Negative for: breast discharge, breast lump(s), breast pain and breast self exam. Associated symptoms include abnormal vaginal bleeding. Pertinent negatives  include abnormal bleeding (hematology), anxiety, decreased libido, depression, difficulty falling sleep, dyspareunia, history of infertility, nocturia, sexual dysfunction, sleep disturbances, urinary incontinence, urinary urgency, vaginal discharge and vaginal itching. Diet regular.The patient states her exercise level is  minimal.   . The patient's tobacco use is:  Social History   Tobacco Use  Smoking Status Never Smoker  Smokeless Tobacco Never Used  . She has been exposed to passive smoke. The patient's alcohol use is:  Social History   Substance and Sexual Activity  Alcohol Use No    Review of Systems  Constitutional: Negative.   HENT: Negative.   Eyes: Negative.  Negative for blurred vision.  Respiratory: Negative.  Negative for shortness of breath.   Cardiovascular: Negative.  Negative for chest pain and palpitations.  Gastrointestinal: Negative.   Endocrine: Negative.   Genitourinary: Negative.   Musculoskeletal: Negative.   Skin: Negative.   Allergic/Immunologic: Negative.   Neurological: Negative.   Psychiatric/Behavioral: Negative.      Today's Vitals   10/30/18 1209  BP: 114/76  Pulse: 95  Temp: 98 F (36.7 C)  TempSrc: Oral  Weight: 162 lb 9.6 oz (73.8 kg)  Height: 5' 5.2" (1.656 m)   Body mass index is 26.89 kg/m.   Objective:  Physical Exam Vitals signs and nursing note reviewed.  Constitutional:      Appearance: Normal appearance.  HENT:     Head: Normocephalic and atraumatic.     Right Ear: Tympanic membrane, ear canal and external ear normal.     Left Ear: Tympanic membrane, ear canal and external ear normal.     Nose: Nose normal.     Mouth/Throat:     Mouth: Mucous membranes are moist.     Pharynx: Oropharynx is clear.  Eyes:     Extraocular Movements: Extraocular movements intact.     Conjunctiva/sclera: Conjunctivae normal.     Pupils: Pupils are equal, round, and reactive to light.  Neck:     Musculoskeletal: Normal range of motion  and neck supple.  Cardiovascular:     Rate and Rhythm: Normal rate and regular rhythm.     Pulses: Normal pulses.          Dorsalis pedis pulses are 2+ on the right side and 2+ on the left side.       Posterior tibial pulses are 2+ on the right side and 2+ on the left side.     Heart sounds: Normal heart sounds.  Pulmonary:     Effort: Pulmonary effort is normal.     Breath sounds: Normal breath sounds.  Chest:     Breasts:        Right: Normal. No swelling, bleeding, inverted nipple, mass, nipple discharge or skin change.        Left: Normal. No swelling, bleeding, inverted nipple, mass, nipple discharge or skin change.  Abdominal:     General: Abdomen is protuberant. Bowel sounds are normal.     Palpations: Abdomen is soft.     Comments: Rounded. Difficult   to assess organomegaly.   Genitourinary:    Comments: deferred Musculoskeletal: Normal range of motion.  Feet:     Right foot:     Protective Sensation: 5 sites tested. 5 sites sensed.     Skin integrity: Skin integrity normal.     Toenail Condition: Right toenails are normal.     Left foot:     Protective Sensation: 5 sites tested. 5 sites sensed.     Skin integrity: Skin integrity normal.     Toenail Condition: Left toenails are normal.  Skin:    General: Skin is warm and dry.  Neurological:     General: No focal deficit present.     Mental Status: She is alert and oriented to person, place, and time.  Psychiatric:        Mood and Affect: Mood normal.        Behavior: Behavior normal.         Assessment And Plan:     1. Routine general medical examination at health care facility  A full exam was performed. Importance of monthly self breast exams was discussed with the patient. PATIENT HAS BEEN ADVISED TO GET 30-45 MINUTES REGULAR EXERCISE NO LESS THAN FOUR TO FIVE DAYS PER WEEK - BOTH WEIGHTBEARING EXERCISES AND AEROBIC ARE RECOMMENDED.  SHE IS ADVISED TO FOLLOW A HEALTHY DIET WITH AT LEAST SIX FRUITS/VEGGIES PER  DAY, DECREASE INTAKE OF RED MEAT, AND TO INCREASE FISH INTAKE TO TWO DAYS PER WEEK.  MEATS/FISH SHOULD NOT BE FRIED, BAKED OR BROILED IS PREFERABLE.  I SUGGEST WEARING SPF 50 SUNSCREEN ON EXPOSED PARTS AND ESPECIALLY WHEN IN THE DIRECT SUNLIGHT FOR AN EXTENDED PERIOD OF TIME.  PLEASE AVOID FAST FOOD RESTAURANTS AND INCREASE YOUR WATER INTAKE.  - CMP14+EGFR - CBC - Lipid panel - Hemoglobin A1c  2. Type 2 diabetes mellitus with diabetic neuropathy, with long-term current use of insulin ([facility])  Diabetic foot exam was performed.  I DISCUSSED WITH THE PATIENT AT LENGTH REGARDING THE GOALS OF GLYCEMIC CONTROL AND POSSIBLE LONG-TERM COMPLICATIONS.  I  ALSO STRESSED THE IMPORTANCE OF COMPLIANCE WITH HOME GLUCOSE MONITORING, DIETARY RESTRICTIONS INCLUDING AVOIDANCE OF SUGARY DRINKS/PROCESSED FOODS,  ALONG WITH REGULAR EXERCISE.  I  ALSO STRESSED THE IMPORTANCE OF ANNUAL EYE EXAMS, SELF FOOT CARE AND COMPLIANCE WITH OFFICE VISITS.   3. Essential (primary) hypertension  Well controlled. She will continue with current meds. She is encouraged to avoid adding salt to her foods. EKG performed, no acute changes noted. She will rto in six months for re-evaluation.   - EKG 12-Lead         N , MD    THE PATIENT IS ENCOURAGED TO PRACTICE SOCIAL DISTANCING DUE TO THE COVID-19 PANDEMIC.   

## 2018-12-13 ENCOUNTER — Encounter: Payer: Self-pay | Admitting: Radiology

## 2018-12-20 ENCOUNTER — Other Ambulatory Visit: Payer: Self-pay | Admitting: Internal Medicine

## 2019-01-20 ENCOUNTER — Other Ambulatory Visit: Payer: Self-pay | Admitting: Internal Medicine

## 2019-02-06 ENCOUNTER — Ambulatory Visit: Payer: Federal, State, Local not specified - PPO | Admitting: Internal Medicine

## 2019-02-06 ENCOUNTER — Other Ambulatory Visit: Payer: Self-pay

## 2019-02-06 ENCOUNTER — Encounter: Payer: Self-pay | Admitting: Internal Medicine

## 2019-02-06 VITALS — BP 118/64 | HR 78 | Temp 97.4°F | Ht 64.6 in | Wt 158.4 lb

## 2019-02-06 DIAGNOSIS — Z6826 Body mass index (BMI) 26.0-26.9, adult: Secondary | ICD-10-CM

## 2019-02-06 DIAGNOSIS — I10 Essential (primary) hypertension: Secondary | ICD-10-CM

## 2019-02-06 DIAGNOSIS — Z794 Long term (current) use of insulin: Secondary | ICD-10-CM

## 2019-02-06 DIAGNOSIS — M25551 Pain in right hip: Secondary | ICD-10-CM

## 2019-02-06 DIAGNOSIS — M25552 Pain in left hip: Secondary | ICD-10-CM

## 2019-02-06 DIAGNOSIS — E1140 Type 2 diabetes mellitus with diabetic neuropathy, unspecified: Secondary | ICD-10-CM | POA: Diagnosis not present

## 2019-02-06 NOTE — Patient Instructions (Signed)
Diabetes Mellitus and Foot Care Foot care is an important part of your health, especially when you have diabetes. Diabetes may cause you to have problems because of poor blood flow (circulation) to your feet and legs, which can cause your skin to:  Become thinner and drier.  Break more easily.  Heal more slowly.  Peel and crack. You may also have nerve damage (neuropathy) in your legs and feet, causing decreased feeling in them. This means that you may not notice minor injuries to your feet that could lead to more serious problems. Noticing and addressing any potential problems early is the best way to prevent future foot problems. How to care for your feet Foot hygiene  Wash your feet daily with warm water and mild soap. Do not use hot water. Then, pat your feet and the areas between your toes until they are completely dry. Do not soak your feet as this can dry your skin.  Trim your toenails straight across. Do not dig under them or around the cuticle. File the edges of your nails with an emery board or nail file.  Apply a moisturizing lotion or petroleum jelly to the skin on your feet and to dry, brittle toenails. Use lotion that does not contain alcohol and is unscented. Do not apply lotion between your toes. Shoes and socks  Wear clean socks or stockings every day. Make sure they are not too tight. Do not wear knee-high stockings since they may decrease blood flow to your legs.  Wear shoes that fit properly and have enough cushioning. Always look in your shoes before you put them on to be sure there are no objects inside.  To break in new shoes, wear them for just a few hours a day. This prevents injuries on your feet. Wounds, scrapes, corns, and calluses  Check your feet daily for blisters, cuts, bruises, sores, and redness. If you cannot see the bottom of your feet, use a mirror or ask someone for help.  Do not cut corns or calluses or try to remove them with medicine.  If you  find a minor scrape, cut, or break in the skin on your feet, keep it and the skin around it clean and dry. You may clean these areas with mild soap and water. Do not clean the area with peroxide, alcohol, or iodine.  If you have a wound, scrape, corn, or callus on your foot, look at it several times a day to make sure it is healing and not infected. Check for: ? Redness, swelling, or pain. ? Fluid or blood. ? Warmth. ? Pus or a bad smell. General instructions  Do not cross your legs. This may decrease blood flow to your feet.  Do not use heating pads or hot water bottles on your feet. They may burn your skin. If you have lost feeling in your feet or legs, you may not know this is happening until it is too late.  Protect your feet from hot and cold by wearing shoes, such as at the beach or on hot pavement.  Schedule a complete foot exam at least once a year (annually) or more often if you have foot problems. If you have foot problems, report any cuts, sores, or bruises to your health care provider immediately. Contact a health care provider if:  You have a medical condition that increases your risk of infection and you have any cuts, sores, or bruises on your feet.  You have an injury that is not   healing.  You have redness on your legs or feet.  You feel burning or tingling in your legs or feet.  You have pain or cramps in your legs and feet.  Your legs or feet are numb.  Your feet always feel cold.  You have pain around a toenail. Get help right away if:  You have a wound, scrape, corn, or callus on your foot and: ? You have pain, swelling, or redness that gets worse. ? You have fluid or blood coming from the wound, scrape, corn, or callus. ? Your wound, scrape, corn, or callus feels warm to the touch. ? You have pus or a bad smell coming from the wound, scrape, corn, or callus. ? You have a fever. ? You have a red line going up your leg. Summary  Check your feet every day  for cuts, sores, red spots, swelling, and blisters.  Moisturize feet and legs daily.  Wear shoes that fit properly and have enough cushioning.  If you have foot problems, report any cuts, sores, or bruises to your health care provider immediately.  Schedule a complete foot exam at least once a year (annually) or more often if you have foot problems. This information is not intended to replace advice given to you by your health care provider. Make sure you discuss any questions you have with your health care provider. Document Released: 05/12/2000 Document Revised: 06/27/2017 Document Reviewed: 06/16/2016 Elsevier Patient Education  2020 [employer]  

## 2019-02-07 LAB — BMP8+EGFR
BUN/Creatinine Ratio: 18 (ref 12–28)
BUN: 16 mg/dL (ref 8–27)
CO2: 27 mmol/L (ref 20–29)
Calcium: 9.5 mg/dL (ref 8.7–10.3)
Chloride: 100 mmol/L (ref 96–106)
Creatinine, Ser: 0.9 mg/dL (ref 0.57–1.00)
GFR calc Af Amer: 79 mL/min/{1.73_m2} (ref >59)
GFR calc non Af Amer: 68 mL/min/{1.73_m2} (ref >59)
Glucose: 148 mg/dL — ABNORMAL HIGH (ref 65–99)
Potassium: 4.2 mmol/L (ref 3.5–5.2)
Sodium: 139 mmol/L (ref 134–144)

## 2019-02-07 LAB — HEMOGLOBIN A1C
Est. average glucose Bld gHb Est-mCnc: 160 mg/dL
Hgb A1c MFr Bld: 7.2 % — ABNORMAL HIGH (ref 4.8–5.6)

## 2019-02-09 NOTE — Progress Notes (Signed)
Subjective:     Patient ID: Sylvia Kelly , female    DOB: [birth year] , 63 y.o.   MRN: [id number]   Chief Complaint  Patient presents with  . Diabetes  . Hypertension    HPI  Diabetes She presents for her follow-up diabetic visit. She has type 2 diabetes mellitus. Her disease course has been fluctuating. There are no hypoglycemic associated symptoms. Pertinent negatives for diabetes include no blurred vision and no chest pain. There are no hypoglycemic complications. Diabetic complications include peripheral neuropathy. Risk factors for coronary artery disease include dyslipidemia, diabetes mellitus, hypertension, post-menopausal and sedentary lifestyle. Her breakfast blood glucose is taken between 9-10 am. Her breakfast blood glucose range is generally 140-180 mg/dl.  Hypertension This is a chronic problem. The current episode started more than 1 year ago. The problem has been gradually improving since onset. The problem is controlled. Pertinent negatives include no blurred vision, chest pain, palpitations or shortness of breath.     Past Medical History:  Diagnosis Date  . Atrophic vaginitis   . Diabetes mellitus   . Elevated cholesterol   . Hypertension      Family History  Problem Relation Age of Onset  . Heart disease Mother   . Diabetes Mother   . Cancer Father        Lung cancer     Current Outpatient Medications:  .  aspirin 81 MG tablet, Take 81 mg by mouth daily.  , Disp: , Rfl:  .  atorvastatin (LIPITOR) 80 MG tablet, TAKE 1 TABLET EVERY DAY, Disp: 90 tablet, Rfl: 1 .  B-D UF III MINI PEN NEEDLES 31G X 5 MM MISC, USE AS DIRECTED WITH LEVEMIR, Disp: 150 each, Rfl: 3 .  Continuous Blood Gluc Sensor (FREESTYLE LIBRE 14 DAY SENSOR) MISC, USE AS DIRECTED 4 TIMES DAILY, Disp: 2 each, Rfl: 2 .  ibuprofen (ADVIL,MOTRIN) 800 MG tablet, Take 1 tablet (800 mg total) by mouth 3 (three) times daily., Disp: 21 tablet, Rfl: 0 .  Insulin Degludec (TRESIBA FLEXTOUCH) 200  UNIT/ML SOPN, Inject 56 Units into the skin at bedtime., Disp: 3 pen, Rfl: 3 .  lisinopril-hydrochlorothiazide (PRINZIDE,ZESTORETIC) 20-25 MG tablet, TAKE 1 TABLET BY MOUTH EVERY DAY (Patient taking differently: 0.5 tablets. ), Disp: 90 tablet, Rfl: 1 .  Multiple Vitamin (MULTIVITAMIN) tablet, Take 1 tablet by mouth daily.  , Disp: , Rfl:  .  OZEMPIC, 1 MG/DOSE, 2 MG/1.5ML SOPN, INJECT 1 MG INTO THE SKIN ONCE A WEEK., Disp: 3 pen, Rfl: 3 .  benzonatate (TESSALON PERLES) 100 MG capsule, Take 1 capsule (100 mg total) by mouth every 6 (six) hours as needed for cough. (Patient not taking: Reported on 02/06/2019), Disp: 30 capsule, Rfl: 1 .  meloxicam (MOBIC) 15 MG tablet, Take 1 tablet (15 mg total) by mouth daily. Take 1 daily with food. (Patient not taking: Reported on 02/06/2019), Disp: 10 tablet, Rfl: 0 .  methocarbamol (ROBAXIN) 500 MG tablet, Take 1 tablet (500 mg total) by mouth 2 (two) times daily. (Patient not taking: Reported on 02/06/2019), Disp: 20 tablet, Rfl: 0   No Known Allergies   Review of Systems  Constitutional: Negative.   Eyes: Negative for blurred vision.  Respiratory: Negative.  Negative for shortness of breath.   Cardiovascular: Negative.  Negative for chest pain and palpitations.  Gastrointestinal: Negative.   Musculoskeletal: Positive for arthralgias.       She c/o b/l hip pain. She reports having stiffness upon awakening. She denies fall/trauma.  There is  some pain with ambulation. She states her sx have improved within past 24 hours.   Neurological: Negative.   Psychiatric/Behavioral: Negative.      Today's Vitals   02/06/19 1104  BP: 118/64  Pulse: 78  Temp: (!) 97.4 F (36.3 C)  TempSrc: Oral  SpO2: 98%  Weight: 158 lb 6.4 oz (71.8 kg)  Height: 5' 4.6" (1.641 m)   Body mass index is 26.69 kg/m.   Objective:  Physical Exam Vitals signs and nursing note reviewed.  Constitutional:      Appearance: Normal appearance.  HENT:     Head: Normocephalic and  atraumatic.  Cardiovascular:     Rate and Rhythm: Normal rate and regular rhythm.     Heart sounds: Normal heart sounds.  Pulmonary:     Effort: Pulmonary effort is normal.     Breath sounds: Normal breath sounds.  Skin:    General: Skin is warm.  Neurological:     General: No focal deficit present.     Mental Status: She is alert.  Psychiatric:        Mood and Affect: Mood normal.        Behavior: Behavior normal.         Assessment And Plan:     1. Type 2 diabetes mellitus with diabetic neuropathy, with long-term current use of insulin ([facility])  Chronic. I will check labs as listed below.  Importance of dietary compliance was discussed with the patient.  - Hemoglobin A1c - BMP8+EGFR  2. Essential (primary) hypertension  Chronic, well controlled. She will continue with current meds.   3. Pain of both hip joints  Resolving. Her sx are possibly due to OA. She is encouraged to increase her daily activity and to decrease her intake of processed foods.   4. Body mass index (BMI) of 26.0 to 26.9 in adult  Pt at fairly good weight, but with abdominal adiposity.  She is encouraged to incorporate more exercise into her daily routine.    [name], MD    THE PATIENT IS ENCOURAGED TO PRACTICE SOCIAL DISTANCING DUE TO THE COVID-19 PANDEMIC.

## 2019-02-14 ENCOUNTER — Other Ambulatory Visit: Payer: Self-pay | Admitting: Internal Medicine

## 2019-03-11 ENCOUNTER — Ambulatory Visit: Payer: Federal, State, Local not specified - PPO

## 2019-03-14 ENCOUNTER — Other Ambulatory Visit: Payer: Self-pay | Admitting: Internal Medicine

## 2019-03-20 ENCOUNTER — Other Ambulatory Visit: Payer: Self-pay

## 2019-03-20 MED ORDER — FREESTYLE LIBRE 14 DAY SENSOR MISC: 1.0000 | Freq: Four times a day (QID) | 2 refills | Status: DC

## 2019-03-23 ENCOUNTER — Other Ambulatory Visit: Payer: Self-pay | Admitting: Internal Medicine

## 2019-05-08 ENCOUNTER — Other Ambulatory Visit: Payer: Self-pay

## 2019-05-08 ENCOUNTER — Encounter: Payer: Self-pay | Admitting: Internal Medicine

## 2019-05-08 ENCOUNTER — Ambulatory Visit (INDEPENDENT_AMBULATORY_CARE_PROVIDER_SITE_OTHER): Payer: Federal, State, Local not specified - PPO | Admitting: Internal Medicine

## 2019-05-08 VITALS — BP 126/78 | HR 101 | Temp 97.3°F | Ht 64.6 in | Wt 157.4 lb

## 2019-05-08 DIAGNOSIS — M25551 Pain in right hip: Secondary | ICD-10-CM | POA: Diagnosis not present

## 2019-05-08 DIAGNOSIS — Z23 Encounter for immunization: Secondary | ICD-10-CM

## 2019-05-08 DIAGNOSIS — M542 Cervicalgia: Secondary | ICD-10-CM | POA: Diagnosis not present

## 2019-05-08 DIAGNOSIS — Z794 Long term (current) use of insulin: Secondary | ICD-10-CM

## 2019-05-08 DIAGNOSIS — E1140 Type 2 diabetes mellitus with diabetic neuropathy, unspecified: Secondary | ICD-10-CM

## 2019-05-08 DIAGNOSIS — I10 Essential (primary) hypertension: Secondary | ICD-10-CM

## 2019-05-08 NOTE — Patient Instructions (Signed)
Diabetes Mellitus and Exercise Exercising regularly is important for your overall health, especially when you have diabetes (diabetes mellitus). Exercising is not only about losing weight. It has many other health benefits, such as increasing muscle strength and bone density and reducing body fat and stress. This leads to improved fitness, flexibility, and endurance, all of which result in better overall health. Exercise has additional benefits for people with diabetes, including:  Reducing appetite.  Helping to lower and control blood glucose.  Lowering blood pressure.  Helping to control amounts of fatty substances (lipids) in the blood, such as cholesterol and triglycerides.  Helping the body to respond better to insulin (improving insulin sensitivity).  Reducing how much insulin the body needs.  Decreasing the risk for heart disease by: ? Lowering cholesterol and triglyceride levels. ? Increasing the levels of good cholesterol. ? Lowering blood glucose levels. What is my activity plan? Your health care provider or certified diabetes educator can help you make a plan for the type and frequency of exercise (activity plan) that works for you. Make sure that you:  Do at least 150 minutes of moderate-intensity or vigorous-intensity exercise each week. This could be brisk walking, biking, or water aerobics. ? Do stretching and strength exercises, such as yoga or weightlifting, at least 2 times a week. ? Spread out your activity over at least 3 days of the week.  Get some form of physical activity every day. ? Do not go more than 2 days in a row without some kind of physical activity. ? Avoid being inactive for more than 30 minutes at a time. Take frequent breaks to walk or stretch.  Choose a type of exercise or activity that you enjoy, and set realistic goals.  Start slowly, and gradually increase the intensity of your exercise over time. What do I need to know about managing my  diabetes?   Check your blood glucose before and after exercising. ? If your blood glucose is 240 mg/dL (13.3 mmol/L) or higher before you exercise, check your urine for ketones. If you have ketones in your urine, do not exercise until your blood glucose returns to normal. ? If your blood glucose is 100 mg/dL (5.6 mmol/L) or lower, eat a snack containing 15-20 grams of carbohydrate. Check your blood glucose 15 minutes after the snack to make sure that your level is above 100 mg/dL (5.6 mmol/L) before you start your exercise.  Know the symptoms of low blood glucose (hypoglycemia) and how to treat it. Your risk for hypoglycemia increases during and after exercise. Common symptoms of hypoglycemia can include: ? Hunger. ? Anxiety. ? Sweating and feeling clammy. ? Confusion. ? Dizziness or feeling light-headed. ? Increased heart rate or palpitations. ? Blurry vision. ? Tingling or numbness around the mouth, lips, or tongue. ? Tremors or shakes. ? Irritability.  Keep a rapid-acting carbohydrate snack available before, during, and after exercise to help prevent or treat hypoglycemia.  Avoid injecting insulin into areas of the body that are going to be exercised. For example, avoid injecting insulin into: ? The arms, when playing tennis. ? The legs, when jogging.  Keep records of your exercise habits. Doing this can help you and your health care provider adjust your diabetes management plan as needed. Write down: ? Food that you eat before and after you exercise. ? Blood glucose levels before and after you exercise. ? The type and amount of exercise you have done. ? When your insulin is expected to peak, if you use   insulin. Avoid exercising at times when your insulin is peaking.  When you start a new exercise or activity, work with your health care provider to make sure the activity is safe for you, and to adjust your insulin, medicines, or food intake as needed.  Drink plenty of water while  you exercise to prevent dehydration or heat stroke. Drink enough fluid to keep your urine clear or pale yellow. Summary  Exercising regularly is important for your overall health, especially when you have diabetes (diabetes mellitus).  Exercising has many health benefits, such as increasing muscle strength and bone density and reducing body fat and stress.  Your health care provider or certified diabetes educator can help you make a plan for the type and frequency of exercise (activity plan) that works for you.  When you start a new exercise or activity, work with your health care provider to make sure the activity is safe for you, and to adjust your insulin, medicines, or food intake as needed. This information is not intended to replace advice given to you by your health care provider. Make sure you discuss any questions you have with your health care provider. Document Released: 08/05/2003 Document Revised: 12/07/2016 Document Reviewed: 10/25/2015 Elsevier Patient Education  2020 Else[employer] Inc.  

## 2019-05-09 LAB — CMP14+EGFR
ALT: 15 IU/L (ref 0–32)
AST: 22 IU/L (ref 0–40)
Albumin/Globulin Ratio: 1.5 (ref 1.2–2.2)
Albumin: 4.3 g/dL (ref 3.8–4.8)
Alkaline Phosphatase: 97 IU/L (ref 39–117)
BUN/Creatinine Ratio: 15 (ref 12–28)
BUN: 15 mg/dL (ref 8–27)
Bilirubin Total: 0.3 mg/dL (ref 0.0–1.2)
CO2: 26 mmol/L (ref 20–29)
Calcium: 9.3 mg/dL (ref 8.7–10.3)
Chloride: 100 mmol/L (ref 96–106)
Creatinine, Ser: 0.98 mg/dL (ref 0.57–1.00)
GFR calc Af Amer: 71 mL/min/{1.73_m2} (ref >59)
GFR calc non Af Amer: 62 mL/min/{1.73_m2} (ref >59)
Globulin, Total: 2.8 g/dL (ref 1.5–4.5)
Glucose: 101 mg/dL — ABNORMAL HIGH (ref 65–99)
Potassium: 4 mmol/L (ref 3.5–5.2)
Sodium: 138 mmol/L (ref 134–144)
Total Protein: 7.1 g/dL (ref 6.0–8.5)

## 2019-05-11 NOTE — Progress Notes (Signed)
This visit occurred during the SARS-CoV-2 public health emergency.  Safety protocols were in place, including screening questions prior to the visit, additional usage of staff PPE, and extensive cleaning of exam room while observing appropriate contact time as indicated for disinfecting solutions.  Subjective:     Patient ID: Sylvia Kelly , female    DOB: August 16, 1955 , 63 y.o.   MRN: [id number]   Chief Complaint  Patient presents with  . Diabetes  . Hypertension    HPI  Diabetes She presents for her follow-up diabetic visit. She has type 2 diabetes mellitus. Her disease course has been fluctuating. There are no hypoglycemic associated symptoms. Pertinent negatives for diabetes include no blurred vision and no chest pain. There are no hypoglycemic complications. Diabetic complications include peripheral neuropathy. Risk factors for coronary artery disease include dyslipidemia, diabetes mellitus, hypertension, post-menopausal and sedentary lifestyle. Her breakfast blood glucose is taken between 9-10 am. Her breakfast blood glucose range is generally 140-180 mg/dl.  Hypertension This is a chronic problem. The current episode started more than 1 year ago. The problem has been gradually improving since onset. The problem is controlled. Pertinent negatives include no blurred vision, chest pain, palpitations or shortness of breath.     Past Medical History:  Diagnosis Date  . Atrophic vaginitis   . Diabetes mellitus   . Elevated cholesterol   . Hypertension      Family History  Problem Relation Age of Onset  . Heart disease Mother   . Diabetes Mother   . Cancer Father        Lung cancer     Current Outpatient Medications:  .  aspirin 81 MG tablet, Take 81 mg by mouth daily.  , Disp: , Rfl:  .  atorvastatin (LIPITOR) 80 MG tablet, TAKE 1 TABLET BY MOUTH EVERY DAY, Disp: 90 tablet, Rfl: 1 .  B-D UF III MINI PEN NEEDLES 31G X 5 MM MISC, USE AS DIRECTED WITH LEVEMIR, Disp: 150 each,  Rfl: 3 .  Continuous Blood Gluc Sensor (FREESTYLE LIBRE 14 DAY SENSOR) MISC, Inject 1 each into the skin QID. Use as directed to check blood sugars 4 times per day dx:[id number], Disp: 2 each, Rfl: 2 .  ibuprofen (ADVIL,MOTRIN) 800 MG tablet, Take 1 tablet (800 mg total) by mouth 3 (three) times daily., Disp: 21 tablet, Rfl: 0 .  Insulin Degludec (TRESIBA FLEXTOUCH) 200 UNIT/ML SOPN, Inject 56 Units into the skin at bedtime., Disp: 3 pen, Rfl: 3 .  lisinopril-hydrochlorothiazide (ZESTORETIC) 20-25 MG tablet, TAKE 1 TABLET BY MOUTH EVERY DAY (Patient taking differently: 1/2 tablet daily), Disp: 90 tablet, Rfl: 1 .  Multiple Vitamin (MULTIVITAMIN) tablet, Take 1 tablet by mouth daily.  , Disp: , Rfl:  .  OZEMPIC, 1 MG/DOSE, 2 MG/1.5ML SOPN, INJECT 1 MG INTO THE SKIN ONCE A WEEK., Disp: 9 pen, Rfl: 1 .  benzonatate (TESSALON PERLES) 100 MG capsule, Take 1 capsule (100 mg total) by mouth every 6 (six) hours as needed for cough. (Patient not taking: Reported on 02/06/2019), Disp: 30 capsule, Rfl: 1 .  meloxicam (MOBIC) 15 MG tablet, Take 1 tablet (15 mg total) by mouth daily. Take 1 daily with food. (Patient not taking: Reported on 02/06/2019), Disp: 10 tablet, Rfl: 0 .  methocarbamol (ROBAXIN) 500 MG tablet, Take 1 tablet (500 mg total) by mouth 2 (two) times daily. (Patient not taking: Reported on 02/06/2019), Disp: 20 tablet, Rfl: 0   No Known Allergies   Review of Systems  Constitutional: Negative.  Eyes: Negative for blurred vision.  Respiratory: Negative.  Negative for shortness of breath.   Cardiovascular: Negative.  Negative for chest pain and palpitations.  Gastrointestinal: Negative.   Musculoskeletal:       She c/o neck pain. She feels her neck/shoulders are always tight. She denies UE paresthesias/weakness. She also has r hip pain.  She denies fall/trauma.   Neurological: Negative.   Psychiatric/Behavioral: Negative.      Today's Vitals   05/08/19 1601  BP: 126/78  Pulse: (!) 101  Temp:  (!) 97.3 F (36.3 C)  TempSrc: Oral  Weight: 157 lb 6.4 oz (71.4 kg)  Height: 5' 4.6" (1.641 m)   Body mass index is 26.52 kg/m.   Objective:  Physical Exam Vitals and nursing note reviewed.  Constitutional:      Appearance: Normal appearance.  HENT:     Head: Normocephalic and atraumatic.  Neck:     Comments: Base of neck/bilateral upper shoulders tender to deep palpation Cardiovascular:     Rate and Rhythm: Normal rate and regular rhythm.     Heart sounds: Normal heart sounds.  Pulmonary:     Effort: Pulmonary effort is normal.     Breath sounds: Normal breath sounds.  Musculoskeletal:     Cervical back: Normal range of motion.     Comments: r hip tender to deep palpation. No pain with flexion.   Skin:    General: Skin is warm.  Neurological:     General: No focal deficit present.     Mental Status: She is alert.  Psychiatric:        Mood and Affect: Mood normal.        Behavior: Behavior normal.         Assessment And Plan:     1. Type 2 diabetes mellitus with diabetic neuropathy, with long-term current use of insulin ([place])  I will check labs as listed below.  Importance of regular exercise was discussed with the patient.   - CMP14+EGFR - Hemoglobin A1c  2. Essential (primary) hypertension  Chronic, well controlled. She will continue with current meds. She is encouraged to avoid adding salt to her foods. Tachycardia noted. She is encouraged to increase her water intake. She may also benefit from magnesium supplementation.   3. Immunization due  She was given pneumovax-23 to update her immunization.   4. Cervicalgia  She is advised to apply topical pain cream to affected area as needed. I will also refer her for chiropractic evaluation.   - Ambulatory referral to Chiropractic  5. Right hip pain  Again, I feel chiropractic eval is appropriate. Dr. Norma Fredrickson does practice ART therapy.   - Ambulatory referral to Chiropractic        [name], MD    THE PATIENT IS ENCOURAGED TO PRACTICE SOCIAL DISTANCING DUE TO THE COVID-19 PANDEMIC.

## 2019-05-12 ENCOUNTER — Telehealth: Payer: Self-pay

## 2019-05-12 DIAGNOSIS — E1140 Type 2 diabetes mellitus with diabetic neuropathy, unspecified: Secondary | ICD-10-CM | POA: Diagnosis not present

## 2019-05-12 DIAGNOSIS — Z794 Long term (current) use of insulin: Secondary | ICD-10-CM | POA: Diagnosis not present

## 2019-05-12 NOTE — Telephone Encounter (Signed)
I left the pt a message that I was calling to see if she could come to the office for a lab draw because her a1c wasn't done with [facility].

## 2019-05-13 LAB — HEMOGLOBIN A1C
Est. average glucose Bld gHb Est-mCnc: 166 mg/dL
Hgb A1c MFr Bld: 7.4 % — ABNORMAL HIGH (ref 4.8–5.6)

## 2019-06-21 ENCOUNTER — Other Ambulatory Visit: Payer: Self-pay | Admitting: Internal Medicine

## 2019-07-02 ENCOUNTER — Other Ambulatory Visit: Payer: Self-pay

## 2019-07-02 ENCOUNTER — Other Ambulatory Visit: Payer: Self-pay | Admitting: Internal Medicine

## 2019-07-30 ENCOUNTER — Other Ambulatory Visit: Payer: Self-pay | Admitting: Internal Medicine

## 2019-08-06 ENCOUNTER — Ambulatory Visit (INDEPENDENT_AMBULATORY_CARE_PROVIDER_SITE_OTHER): Payer: Federal, State, Local not specified - PPO | Admitting: Internal Medicine

## 2019-08-06 ENCOUNTER — Encounter: Payer: Self-pay | Admitting: Internal Medicine

## 2019-08-06 ENCOUNTER — Other Ambulatory Visit: Payer: Self-pay

## 2019-08-06 VITALS — BP 118/68 | HR 106 | Temp 98.4°F | Ht 64.0 in | Wt 162.0 lb

## 2019-08-06 DIAGNOSIS — Z794 Long term (current) use of insulin: Secondary | ICD-10-CM

## 2019-08-06 DIAGNOSIS — I10 Essential (primary) hypertension: Secondary | ICD-10-CM | POA: Diagnosis not present

## 2019-08-06 DIAGNOSIS — F5102 Adjustment insomnia: Secondary | ICD-10-CM | POA: Diagnosis not present

## 2019-08-06 DIAGNOSIS — E663 Overweight: Secondary | ICD-10-CM

## 2019-08-06 DIAGNOSIS — E1140 Type 2 diabetes mellitus with diabetic neuropathy, unspecified: Secondary | ICD-10-CM | POA: Diagnosis not present

## 2019-08-06 DIAGNOSIS — Z6827 Body mass index (BMI) 27.0-27.9, adult: Secondary | ICD-10-CM

## 2019-08-06 NOTE — Patient Instructions (Signed)
Magnesium 250-400mg  nightly   Insomnia Insomnia is a sleep disorder that makes it difficult to fall asleep or stay asleep. Insomnia can cause fatigue, low energy, difficulty concentrating, mood swings, and poor performance at work or school. There are three different ways to classify insomnia:  Difficulty falling asleep.  Difficulty staying asleep.  Waking up too early in the morning. Any type of insomnia can be long-term (chronic) or short-term (acute). Both are common. Short-term insomnia usually lasts for three months or less. Chronic insomnia occurs at least three times a week for longer than three months. What are the causes? Insomnia may be caused by another condition, situation, or substance, such as:  Anxiety.  Certain medicines.  Gastroesophageal reflux disease (GERD) or other gastrointestinal conditions.  Asthma or other breathing conditions.  Restless legs syndrome, sleep apnea, or other sleep disorders.  Chronic pain.  Menopause.  Stroke.  Abuse of alcohol, tobacco, or illegal drugs.  Mental health conditions, such as depression.  Caffeine.  Neurological disorders, such as Alzheimer's disease.  An overactive thyroid (hyperthyroidism). Sometimes, the cause of insomnia may not be known. What increases the risk? Risk factors for insomnia include:  Gender. Women are affected more often than men.  Age. Insomnia is more common as you get older.  Stress.  Lack of exercise.  Irregular work schedule or working night shifts.  Traveling between different time zones.  Certain medical and mental health conditions. What are the signs or symptoms? If you have insomnia, the main symptom is having trouble falling asleep or having trouble staying asleep. This may lead to other symptoms, such as:  Feeling fatigued or having low energy.  Feeling nervous about going to sleep.  Not feeling rested in the morning.  Having trouble concentrating.  Feeling  irritable, anxious, or depressed. How is this diagnosed? This condition may be diagnosed based on:  Your symptoms and medical history. Your health care provider may ask about: ? Your sleep habits. ? Any medical conditions you have. ? Your mental health.  A physical exam. How is this treated? Treatment for insomnia depends on the cause. Treatment may focus on treating an underlying condition that is causing insomnia. Treatment may also include:  Medicines to help you sleep.  Counseling or therapy.  Lifestyle adjustments to help you sleep better. Follow these instructions at home: Eating and drinking   Limit or avoid alcohol, caffeinated beverages, and cigarettes, especially close to bedtime. These can disrupt your sleep.  Do not eat a large meal or eat spicy foods right before bedtime. This can lead to digestive discomfort that can make it hard for you to sleep. Sleep habits   Keep a sleep diary to help you and your health care provider figure out what could be causing your insomnia. Write down: ? When you sleep. ? When you wake up during the night. ? How well you sleep. ? How rested you feel the next day. ? Any side effects of medicines you are taking. ? What you eat and drink.  Make your bedroom a dark, comfortable place where it is easy to fall asleep. ? Put up shades or blackout curtains to block light from outside. ? Use a white noise machine to block noise. ? Keep the temperature cool.  Limit screen use before bedtime. This includes: ? Watching TV. ? Using your smartphone, tablet, or computer.  Stick to a routine that includes going to bed and waking up at the same times every day and night. This can help  you fall asleep faster. Consider making a quiet activity, such as reading, part of your nighttime routine.  Try to avoid taking naps during the day so that you sleep better at night.  Get out of bed if you are still awake after 15 minutes of trying to sleep.  Keep the lights down, but try reading or doing a quiet activity. When you feel sleepy, go back to bed. General instructions  Take over-the-counter and prescription medicines only as told by your health care provider.  Exercise regularly, as told by your health care provider. Avoid exercise starting several hours before bedtime.  Use relaxation techniques to manage stress. Ask your health care provider to suggest some techniques that may work well for you. These may include: ? Breathing exercises. ? Routines to release muscle tension. ? Visualizing peaceful scenes.  Make sure that you drive carefully. Avoid driving if you feel very sleepy.  Keep all follow-up visits as told by your health care provider. This is important. Contact a health care provider if:  You are tired throughout the day.  You have trouble in your daily routine due to sleepiness.  You continue to have sleep problems, or your sleep problems get worse. Get help right away if:  You have serious thoughts about hurting yourself or someone else. If you ever feel like you may hurt yourself or others, or have thoughts about taking your own life, get help right away. You can go to your nearest emergency department or call:  Your local emergency services (911 in the [place]  A suicide crisis helpline, such as the [place] at 661-630-1023. This is open 24 hours a day. Summary  Insomnia is a sleep disorder that makes it difficult to fall asleep or stay asleep.  Insomnia can be long-term (chronic) or short-term (acute).  Treatment for insomnia depends on the cause. Treatment may focus on treating an underlying condition that is causing insomnia.  Keep a sleep diary to help you and your health care provider figure out what could be causing your insomnia. This information is not intended to replace advice given to you by your health care provider. Make sure you discuss any questions you have with  your health care provider. Document Revised: 04/27/2017 Document Reviewed: 02/22/2017 Elsevier Patient Education  2020 [employer].

## 2019-08-07 LAB — BMP8+EGFR
BUN/Creatinine Ratio: 18 (ref 12–28)
BUN: 16 mg/dL (ref 8–27)
CO2: 26 mmol/L (ref 20–29)
Calcium: 9.2 mg/dL (ref 8.7–10.3)
Chloride: 98 mmol/L (ref 96–106)
Creatinine, Ser: 0.9 mg/dL (ref 0.57–1.00)
GFR calc Af Amer: 79 mL/min/{1.73_m2} (ref >59)
GFR calc non Af Amer: 68 mL/min/{1.73_m2} (ref >59)
Glucose: 265 mg/dL — ABNORMAL HIGH (ref 65–99)
Potassium: 3.9 mmol/L (ref 3.5–5.2)
Sodium: 137 mmol/L (ref 134–144)

## 2019-08-07 LAB — HEMOGLOBIN A1C
Est. average glucose Bld gHb Est-mCnc: 200 mg/dL
Hgb A1c MFr Bld: 8.6 % — ABNORMAL HIGH (ref 4.8–5.6)

## 2019-08-08 DIAGNOSIS — E119 Type 2 diabetes mellitus without complications: Secondary | ICD-10-CM | POA: Diagnosis not present

## 2019-08-08 LAB — HM DIABETES EYE EXAM

## 2019-08-12 ENCOUNTER — Encounter: Payer: Self-pay | Admitting: Internal Medicine

## 2019-08-12 NOTE — Progress Notes (Signed)
This visit occurred during the SARS-CoV-2 public health emergency.  Safety protocols were in place, including screening questions prior to the visit, additional usage of staff PPE, and extensive cleaning of exam room while observing appropriate contact time as indicated for disinfecting solutions.  Subjective:     Patient ID: Sylvia Kelly , female    DOB: 04-May-1956 , 64 y.o.   MRN: [id number]   Chief Complaint  Patient presents with  . Diabetes  . Hypertension    HPI  She presents today for DM/HTN check. She reports compliance with meds.  Hasn't been keeping track of sugars lately. Unfortunately, her son has suffered a stroke and is in ICU. She admits she has not been sleeping well.   Diabetes She presents for her follow-up diabetic visit. She has type 2 diabetes mellitus. Her disease course has been improving. There are no hypoglycemic associated symptoms. Pertinent negatives for diabetes include no blurred vision, no chest pain and no fatigue. There are no hypoglycemic complications. Diabetic complications include peripheral neuropathy. Risk factors for coronary artery disease include diabetes mellitus, dyslipidemia, sedentary lifestyle, hypertension and post-menopausal.  Hypertension This is a chronic problem. The current episode started more than 1 year ago. The problem has been gradually improving since onset. Pertinent negatives include no blurred vision, chest pain, palpitations or shortness of breath.     Past Medical History:  Diagnosis Date  . Atrophic vaginitis   . Diabetes mellitus   . Elevated cholesterol   . Hypertension      Family History  Problem Relation Age of Onset  . Heart disease Mother   . Diabetes Mother   . Cancer Father        Lung cancer     Current Outpatient Medications:  .  aspirin 81 MG tablet, Take 81 mg by mouth daily.  , Disp: , Rfl:  .  atorvastatin (LIPITOR) 80 MG tablet, TAKE 1 TABLET BY MOUTH EVERY DAY, Disp: 90 tablet, Rfl: 1 .   B-D UF III MINI PEN NEEDLES 31G X 5 MM MISC, USE AS DIRECTED WITH LEVEMIR, Disp: 150 each, Rfl: 3 .  Continuous Blood Gluc Sensor (FREESTYLE LIBRE 14 DAY SENSOR) MISC, INJECT 1 EACH INTO THE SKIN QID. USE AS DIRECTED TO CHECK BLOOD SUGARS 4 TIMES PER DAY DX:E11.65, Disp: 6 each, Rfl: 2 .  ibuprofen (ADVIL,MOTRIN) 800 MG tablet, Take 1 tablet (800 mg total) by mouth 3 (three) times daily., Disp: 21 tablet, Rfl: 0 .  lisinopril-hydrochlorothiazide (ZESTORETIC) 20-25 MG tablet, TAKE 1 TABLET BY MOUTH EVERY DAY (Patient taking differently: 1/2 tablet daily), Disp: 90 tablet, Rfl: 1 .  Multiple Vitamin (MULTIVITAMIN) tablet, Take 1 tablet by mouth daily.  , Disp: , Rfl:  .  OZEMPIC, 1 MG/DOSE, 2 MG/1.5ML SOPN, INJECT 1 MG INTO THE SKIN ONCE A WEEK., Disp: 9 pen, Rfl: 1 .  TRESIBA FLEXTOUCH 200 UNIT/ML SOPN, INJECT 56 UNITS INTO THE SKIN AT BEDTIME., Disp: 9 pen, Rfl: 1   No Known Allergies   Review of Systems  Constitutional: Negative.  Negative for fatigue.  Eyes: Negative for blurred vision.  Respiratory: Negative.  Negative for shortness of breath.   Cardiovascular: Negative.  Negative for chest pain and palpitations.  Gastrointestinal: Negative.   Neurological: Negative.   Psychiatric/Behavioral: Positive for sleep disturbance.     Today's Vitals   08/06/19 1145  BP: 118/68  Pulse: (!) 106  Temp: 98.4 F (36.9 C)  TempSrc: Oral  SpO2: 94%  Weight: 162 lb (73.5 kg)  Height: 5' 4"  (1.626 m)   Body mass index is 27.81 kg/m.   Objective:  Physical Exam Vitals and nursing note reviewed.  Constitutional:      Appearance: Normal appearance. She is obese.  HENT:     Head: Normocephalic and atraumatic.  Cardiovascular:     Rate and Rhythm: Normal rate and regular rhythm.     Heart sounds: Normal heart sounds.  Pulmonary:     Effort: Pulmonary effort is normal.     Breath sounds: Normal breath sounds.  Skin:    General: Skin is warm.  Neurological:     General: No focal deficit  present.     Mental Status: She is alert.  Psychiatric:        Mood and Affect: Mood normal.        Behavior: Behavior normal.         Assessment And Plan:     1. Type 2 diabetes mellitus with diabetic neuropathy, with long-term current use of insulin (HCC)  Chronic, I will check labs as listed below. I will make medication adjustments as needed. Importance of medication and dietary compliance was stressed to the patient.   - BMP8+EGFR - Hemoglobin A1c  2. Essential (primary) hypertension  Chronic, well controlled. She will continue with current meds. She is encouraged to avoid adding salt to her foods. Importance of regular exercise was also discussed with the patient.   3. Adjustment insomnia  We discussed several techniques to help her sleep. She is encouraged to try magnesium 250-421m nightly. We will consider pharmacologic therapies as a last resort.   4. Overweight with body mass index (BMI) of 27 to 27.9 in adult  She is encouraged to strive for BMI less than 25 to decrease cardiac risk.     RMaximino Greenland MD    THE PATIENT IS ENCOURAGED TO PRACTICE SOCIAL DISTANCING DUE TO THE COVID-19 PANDEMIC.

## 2019-08-16 ENCOUNTER — Other Ambulatory Visit: Payer: Self-pay | Admitting: Internal Medicine

## 2019-10-06 ENCOUNTER — Other Ambulatory Visit: Payer: Self-pay

## 2019-10-06 ENCOUNTER — Ambulatory Visit (INDEPENDENT_AMBULATORY_CARE_PROVIDER_SITE_OTHER): Payer: Federal, State, Local not specified - PPO | Admitting: Internal Medicine

## 2019-10-06 ENCOUNTER — Encounter: Payer: Self-pay | Admitting: Internal Medicine

## 2019-10-06 VITALS — BP 126/74 | HR 79 | Temp 98.3°F | Ht 64.0 in | Wt 158.4 lb

## 2019-10-06 DIAGNOSIS — R253 Fasciculation: Secondary | ICD-10-CM

## 2019-10-06 DIAGNOSIS — F5102 Adjustment insomnia: Secondary | ICD-10-CM | POA: Diagnosis not present

## 2019-10-06 DIAGNOSIS — Z6827 Body mass index (BMI) 27.0-27.9, adult: Secondary | ICD-10-CM | POA: Diagnosis not present

## 2019-10-06 DIAGNOSIS — F4321 Adjustment disorder with depressed mood: Secondary | ICD-10-CM

## 2019-10-06 DIAGNOSIS — Z634 Disappearance and death of family member: Secondary | ICD-10-CM

## 2019-10-06 MED ORDER — ALPRAZOLAM 0.5 MG PO TABS: 0.5000 mg | ORAL_TABLET | Freq: Two times a day (BID) | ORAL | 0 refills | Status: DC | PRN

## 2019-10-06 NOTE — Patient Instructions (Signed)
Managing Loss, Adult People experience loss in many different ways throughout their lives. Events such as moving, changing jobs, and losing friends can create a sense of loss. The loss may be as serious as a major health change, divorce, death of a pet, or death of a loved one. All of these types of loss are likely to create a physical and emotional reaction known as grief. Grief is the result of a major change or an absence of something or someone that you count on. Grief is a normal reaction to loss. A variety of factors can affect your grieving experience, including:  The nature of your loss.  Your relationship to what or whom you lost.  Your understanding of grief and how to manage it.  Your support system. How to manage lifestyle changes Keep to your normal routine as much as possible.  If you have trouble focusing or doing normal activities, it is acceptable to take some time away from your normal routine.  Spend time with friends and loved ones.  Eat a healthy diet, get plenty of sleep, and rest when you feel tired. How to recognize changes  The way that you deal with your grief will affect your ability to function as you normally do. When grieving, you may experience these changes:  Numbness, shock, sadness, anxiety, anger, denial, and guilt.  Thoughts about death.  Unexpected crying.  A physical sensation of emptiness in your stomach.  Problems sleeping and eating.  Tiredness (fatigue).  Loss of interest in normal activities.  Dreaming about or imagining seeing the person who died.  A need to remember what or whom you lost.  Difficulty thinking about anything other than your loss for a period of time.  Relief. If you have been expecting the loss for a while, you may feel a sense of relief when it happens. Follow these instructions at home:  Activity Express your feelings in healthy ways, such as:  Talking with others about your loss. It may be helpful to find  others who have had a similar loss, such as a support group.  Writing down your feelings in a journal.  Doing physical activities to release stress and emotional energy.  Doing creative activities like painting, sculpting, or playing or listening to music.  Practicing resilience. This is the ability to recover and adjust after facing challenges. Reading some resources that encourage resilience may help you to learn ways to practice those behaviors. General instructions  Be patient with yourself and others. Allow the grieving process to happen, and remember that grieving takes time. ? It is likely that you may never feel completely done with some grief. You may find a way to move on while still cherishing memories and feelings about your loss. ? Accepting your loss is a process. It can take months or longer to adjust.  Keep all follow-up visits as told by your health care provider. This is important. Where to find support To get support for managing loss:  Ask your health care provider for help and recommendations, such as grief counseling or therapy.  Think about joining a support group for people who are managing a loss. Where to find more information You can find more information about managing loss from:  American Society of Clinical Oncology: www.cancer.net  American Psychological Association: www.apa.org Contact a health care provider if:  Your grief is extreme and keeps getting worse.  You have ongoing grief that does not improve.  Your body shows symptoms of grief, such   as illness.  You feel depressed, anxious, or lonely. Get help right away if:  You have thoughts about hurting yourself or others. If you ever feel like you may hurt yourself or others, or have thoughts about taking your own life, get help right away. You can go to your nearest emergency department or call:  Your local emergency services (911 in the [place]  A suicide crisis helpline, such as the  National Suicide Prevention Lifeline at 1-800-273-8255. This is open 24 hours a day. Summary  Grief is the result of a major change or an absence of someone or something that you count on. Grief is a normal reaction to loss.  The depth of grief and the period of recovery depend on the type of loss and your ability to adjust to the change and process your feelings.  Processing grief requires patience and a willingness to accept your feelings and talk about your loss with people who are supportive.  It is important to find resources that work for you and to realize that people experience grief differently. There is not one grieving process that works for everyone in the same way.  Be aware that when grief becomes extreme, it can lead to more severe issues like isolation, depression, anxiety, or suicidal thoughts. Talk with your health care provider if you have any of these issues. This information is not intended to replace advice given to you by your health care provider. Make sure you discuss any questions you have with your health care provider. Document Revised: 07/19/2018 Document Reviewed: 09/28/2016 Elsevier Patient Education  2020 [employer] Inc[employer]  

## 2019-10-07 ENCOUNTER — Telehealth: Payer: Self-pay

## 2019-10-07 NOTE — Telephone Encounter (Signed)
Pt notified sample of Ozempic is ready for pickup.

## 2019-10-08 ENCOUNTER — Other Ambulatory Visit: Payer: Self-pay | Admitting: Internal Medicine

## 2019-10-08 DIAGNOSIS — Z1231 Encounter for screening mammogram for malignant neoplasm of breast: Secondary | ICD-10-CM

## 2019-10-18 NOTE — Progress Notes (Signed)
This visit occurred during the SARS-CoV-2 public health emergency.  Safety protocols were in place, including screening questions prior to the visit, additional usage of staff PPE, and extensive cleaning of exam room while observing appropriate contact time as indicated for disinfecting solutions.  Subjective:     Patient ID: Sylvia Kelly , female    DOB: 05/28/1956 , 64 y.o.   MRN: [id number]   Chief Complaint  Patient presents with  . Grief    HPI  She is here today for evaluation of grief and inability to sleep. She reports that she lost her son unexpectedly. He died from a stroke. She reports this occurred in the past month. She has not been able to sleep or eat much since he died. She states she was very close to her son. She misses talking with him. She is happy to share that someone made a quilt for her with their pictures.     Past Medical History:  Diagnosis Date  . Atrophic vaginitis   . Diabetes mellitus   . Elevated cholesterol   . Hypertension      Family History  Problem Relation Age of Onset  . Heart disease Mother   . Diabetes Mother   . Cancer Father        Lung cancer     Current Outpatient Medications:  .  aspirin 81 MG tablet, Take 81 mg by mouth daily.  , Disp: , Rfl:  .  atorvastatin (LIPITOR) 80 MG tablet, TAKE 1 TABLET BY MOUTH EVERY DAY, Disp: 90 tablet, Rfl: 1 .  B-D UF III MINI PEN NEEDLES 31G X 5 MM MISC, USE AS DIRECTED WITH LEVEMIR, Disp: 150 each, Rfl: 3 .  Continuous Blood Gluc Sensor (FREESTYLE LIBRE 14 DAY SENSOR) MISC, INJECT 1 EACH INTO THE SKIN QID. USE AS DIRECTED TO CHECK BLOOD SUGARS 4 TIMES PER DAY DX:E11.65, Disp: 6 each, Rfl: 2 .  ibuprofen (ADVIL,MOTRIN) 800 MG tablet, Take 1 tablet (800 mg total) by mouth 3 (three) times daily., Disp: 21 tablet, Rfl: 0 .  lisinopril-hydrochlorothiazide (ZESTORETIC) 20-25 MG tablet, TAKE 1 TABLET BY MOUTH EVERY DAY (Patient taking differently: 1/2 tablet daily), Disp: 90 tablet, Rfl: 1 .   Multiple Vitamin (MULTIVITAMIN) tablet, Take 1 tablet by mouth daily.  , Disp: , Rfl:  .  OZEMPIC, 1 MG/DOSE, 2 MG/1.5ML SOPN, INJECT 1 MG INTO THE SKIN ONCE A WEEK., Disp: 6 pen, Rfl: 1 .  TRESIBA FLEXTOUCH 200 UNIT/ML SOPN, INJECT 56 UNITS INTO THE SKIN AT BEDTIME., Disp: 9 pen, Rfl: 1 .  ALPRAZolam (XANAX) 0.5 MG tablet, Take 1 tablet (0.5 mg total) by mouth 2 (two) times daily as needed for up to 30 doses for anxiety or sleep., Disp: 30 tablet, Rfl: 0   No Known Allergies   Review of Systems  Constitutional: Negative.   Respiratory: Negative.   Cardiovascular: Negative.   Gastrointestinal: Negative.   Musculoskeletal:       States she can feel her eyelid twitching. It has been going on for the past several days. She is unable to state what triggers her sx.   Neurological: Negative.   Psychiatric/Behavioral: Positive for dysphoric mood and sleep disturbance.     Today's Vitals   10/06/19 1406  BP: 126/74  Pulse: 79  Temp: 98.3 F (36.8 C)  TempSrc: Oral  Weight: 158 lb 6.4 oz (71.8 kg)  Height: 5\' 4"  (1.626 m)   Body mass index is 27.19 kg/m.   Objective:  Physical Exam  Vitals and nursing note reviewed.  Constitutional:      Appearance: Normal appearance.  HENT:     Head: Normocephalic and atraumatic.  Cardiovascular:     Rate and Rhythm: Normal rate and regular rhythm.     Heart sounds: Normal heart sounds.  Pulmonary:     Effort: Pulmonary effort is normal.     Breath sounds: Normal breath sounds.  Skin:    General: Skin is warm.  Neurological:     General: No focal deficit present.     Mental Status: She is alert.  Psychiatric:        Mood and Affect: Mood normal.        Behavior: Behavior normal.         Assessment And Plan:     1. Grief at loss of child  Acute. She does not wish to seek Hospice for grief counseling. She reports having a tight circle of friends/pastoral support. She was able to smile while discussing memories of her son. She will let  me know if she changes her mind.   2. Adjustment insomnia  She was given rx alprazolam to use prn.   3. Fasciculations  Not seen on exam. Advised to take magnesium nightly and to stay well hydrated. She will let me know if her sx persist.   4. Body mass index (BMI) of 27.0-27.9 in adult  She is almost at her goal weight. She is encouraged to incorporate more exercise into her daily routine.   [name], MD    THE PATIENT IS ENCOURAGED TO PRACTICE SOCIAL DISTANCING DUE TO THE COVID-19 PANDEMIC.

## 2019-11-04 ENCOUNTER — Encounter: Payer: Self-pay | Admitting: Internal Medicine

## 2019-11-04 ENCOUNTER — Other Ambulatory Visit: Payer: Self-pay

## 2019-11-04 ENCOUNTER — Ambulatory Visit: Payer: Federal, State, Local not specified - PPO | Admitting: Internal Medicine

## 2019-11-04 VITALS — BP 124/70 | HR 76 | Temp 98.1°F | Ht 64.0 in | Wt 157.4 lb

## 2019-11-04 DIAGNOSIS — Z794 Long term (current) use of insulin: Secondary | ICD-10-CM

## 2019-11-04 DIAGNOSIS — E1140 Type 2 diabetes mellitus with diabetic neuropathy, unspecified: Secondary | ICD-10-CM

## 2019-11-04 DIAGNOSIS — F5102 Adjustment insomnia: Secondary | ICD-10-CM | POA: Diagnosis not present

## 2019-11-04 DIAGNOSIS — E049 Nontoxic goiter, unspecified: Secondary | ICD-10-CM | POA: Diagnosis not present

## 2019-11-04 DIAGNOSIS — I10 Essential (primary) hypertension: Secondary | ICD-10-CM | POA: Diagnosis not present

## 2019-11-04 DIAGNOSIS — E785 Hyperlipidemia, unspecified: Secondary | ICD-10-CM | POA: Diagnosis not present

## 2019-11-04 DIAGNOSIS — Z0000 Encounter for general adult medical examination without abnormal findings: Secondary | ICD-10-CM

## 2019-11-04 DIAGNOSIS — E1165 Type 2 diabetes mellitus with hyperglycemia: Secondary | ICD-10-CM | POA: Diagnosis not present

## 2019-11-04 LAB — POCT URINALYSIS DIPSTICK
Bilirubin, UA: NEGATIVE
Blood, UA: NEGATIVE
Glucose, UA: NEGATIVE
Ketones, UA: NEGATIVE
Nitrite, UA: NEGATIVE
Protein, UA: NEGATIVE
Spec Grav, UA: >=1.03 — AB (ref 1.010–1.025)
Urobilinogen, UA: 0.2 E.U./dL
pH, UA: 5.5 (ref 5.0–8.0)

## 2019-11-04 LAB — CBC
Hematocrit: 36.8 % (ref 34.0–46.6)
Hemoglobin: 12.1 g/dL (ref 11.1–15.9)
MCH: 29.5 pg (ref 26.6–33.0)
MCHC: 32.9 g/dL (ref 31.5–35.7)
MCV: 90 fL (ref 79–97)
Platelets: 285 10*3/uL (ref 150–450)
RBC: 4.1 x10E6/uL (ref 3.77–5.28)
RDW: 12.7 % (ref 11.7–15.4)
WBC: 3.6 10*3/uL (ref 3.4–10.8)

## 2019-11-04 LAB — CMP14+EGFR
ALT: 26 IU/L (ref 0–32)
AST: 23 IU/L (ref 0–40)
Albumin/Globulin Ratio: 1.7 (ref 1.2–2.2)
Albumin: 4.1 g/dL (ref 3.8–4.8)
Alkaline Phosphatase: 93 IU/L (ref 48–121)
BUN/Creatinine Ratio: 15 (ref 12–28)
BUN: 15 mg/dL (ref 8–27)
Bilirubin Total: 0.4 mg/dL (ref 0.0–1.2)
CO2: 27 mmol/L (ref 20–29)
Calcium: 9.1 mg/dL (ref 8.7–10.3)
Chloride: 101 mmol/L (ref 96–106)
Creatinine, Ser: 0.98 mg/dL (ref 0.57–1.00)
GFR calc Af Amer: 71 mL/min/{1.73_m2} (ref >59)
GFR calc non Af Amer: 62 mL/min/{1.73_m2} (ref >59)
Globulin, Total: 2.4 g/dL (ref 1.5–4.5)
Glucose: 124 mg/dL — ABNORMAL HIGH (ref 65–99)
Potassium: 4.3 mmol/L (ref 3.5–5.2)
Sodium: 139 mmol/L (ref 134–144)
Total Protein: 6.5 g/dL (ref 6.0–8.5)

## 2019-11-04 LAB — HEMOGLOBIN A1C
Est. average glucose Bld gHb Est-mCnc: 214 mg/dL
Hgb A1c MFr Bld: 9.1 % — ABNORMAL HIGH (ref 4.8–5.6)

## 2019-11-04 LAB — LIPID PANEL
Chol/HDL Ratio: 2.7 ratio (ref 0.0–4.4)
Cholesterol, Total: 162 mg/dL (ref 100–199)
HDL: 60 mg/dL (ref >39)
LDL Chol Calc (NIH): 86 mg/dL (ref 0–99)
Triglycerides: 87 mg/dL (ref 0–149)
VLDL Cholesterol Cal: 16 mg/dL (ref 5–40)

## 2019-11-04 LAB — POCT UA - MICROALBUMIN
Albumin/Creatinine Ratio, Urine, POC: 30
Creatinine, POC: 300 mg/dL
Microalbumin Ur, POC: 30 mg/L

## 2019-11-04 NOTE — Progress Notes (Signed)
This visit occurred during the SARS-CoV-2 public health emergency.  Safety protocols were in place, including screening questions prior to the visit, additional usage of staff PPE, and extensive cleaning of exam room while observing appropriate contact time as indicated for disinfecting solutions.  Subjective:     Patient ID: Sylvia Kelly , female    DOB: 01/01/1956 , 63 y.o.   MRN: [id number]   Chief Complaint  Patient presents with  . Annual Exam  . Diabetes  . Hypertension    HPI  She is here today for a full physical examination. She is followed by Dr. Anyawu for her GYN exams. She would like referral   Diabetes She presents for her follow-up diabetic visit. She has type 2 diabetes mellitus. Her disease course has been fluctuating. There are no hypoglycemic associated symptoms. Pertinent negatives for diabetes include no blurred vision and no chest pain. There are no hypoglycemic complications. Diabetic complications include peripheral neuropathy. Risk factors for coronary artery disease include dyslipidemia, diabetes mellitus, hypertension, post-menopausal and sedentary lifestyle. Her breakfast blood glucose is taken between 9-10 am. Her breakfast blood glucose range is generally 140-180 mg/dl.  Hypertension This is a chronic problem. The current episode started more than 1 year ago. The problem has been gradually improving since onset. The problem is controlled. Pertinent negatives include no blurred vision, chest pain, palpitations or shortness of breath.     Past Medical History:  Diagnosis Date  . Atrophic vaginitis   . Diabetes mellitus   . Elevated cholesterol   . Hypertension      Family History  Problem Relation Age of Onset  . Heart disease Mother   . Diabetes Mother   . Cancer Father        Lung cancer     Current Outpatient Medications:  .  ALPRAZolam (XANAX) 0.5 MG tablet, Take 1 tablet (0.5 mg total) by mouth 2 (two) times daily as needed for up to 30  doses for anxiety or sleep., Disp: 30 tablet, Rfl: 0 .  aspirin 81 MG tablet, Take 81 mg by mouth daily.  , Disp: , Rfl:  .  atorvastatin (LIPITOR) 80 MG tablet, TAKE 1 TABLET BY MOUTH EVERY DAY, Disp: 90 tablet, Rfl: 1 .  B-D UF III MINI PEN NEEDLES 31G X 5 MM MISC, USE AS DIRECTED WITH LEVEMIR, Disp: 150 each, Rfl: 3 .  Continuous Blood Gluc Sensor (FREESTYLE LIBRE 14 DAY SENSOR) MISC, INJECT 1 EACH INTO THE SKIN QID. USE AS DIRECTED TO CHECK BLOOD SUGARS 4 TIMES PER DAY DX:E11.65, Disp: 6 each, Rfl: 2 .  ibuprofen (ADVIL,MOTRIN) 800 MG tablet, Take 1 tablet (800 mg total) by mouth 3 (three) times daily., Disp: 21 tablet, Rfl: 0 .  lisinopril-hydrochlorothiazide (ZESTORETIC) 20-25 MG tablet, TAKE 1 TABLET BY MOUTH EVERY DAY (Patient taking differently: 1/2 tablet daily), Disp: 90 tablet, Rfl: 1 .  Multiple Vitamin (MULTIVITAMIN) tablet, Take 1 tablet by mouth daily.  , Disp: , Rfl:  .  OZEMPIC, 1 MG/DOSE, 2 MG/1.5ML SOPN, INJECT 1 MG INTO THE SKIN ONCE A WEEK., Disp: 6 pen, Rfl: 1 .  TRESIBA FLEXTOUCH 200 UNIT/ML SOPN, INJECT 56 UNITS INTO THE SKIN AT BEDTIME., Disp: 9 pen, Rfl: 1   No Known Allergies    The patient states she uses post menopausal status for birth control. Last LMP was No LMP recorded. Patient is postmenopausal.. Negative for Dysmenorrhea  Negative for: breast discharge, breast lump(s), breast pain and breast self exam. Associated symptoms include abnormal   vaginal bleeding. Pertinent negatives include abnormal bleeding (hematology), anxiety, decreased libido, depression, difficulty falling sleep, dyspareunia, history of infertility, nocturia, sexual dysfunction, sleep disturbances, urinary incontinence, urinary urgency, vaginal discharge and vaginal itching. Diet regular.The patient states her exercise level is  intermittent.  . The patient's tobacco use is:  Social History   Tobacco Use  Smoking Status Never Smoker  Smokeless Tobacco Never Used  . She has been exposed to  passive smoke. The patient's alcohol use is:  Social History   Substance and Sexual Activity  Alcohol Use No    Review of Systems  Constitutional: Negative.   HENT: Negative.   Eyes: Negative.  Negative for blurred vision.  Respiratory: Negative.  Negative for shortness of breath.   Cardiovascular: Negative.  Negative for chest pain and palpitations.  Endocrine: Negative.   Genitourinary: Negative.   Musculoskeletal: Negative.   Skin: Negative.   Allergic/Immunologic: Negative.   Neurological: Negative.   Hematological: Negative.   Psychiatric/Behavioral: Positive for sleep disturbance.       She reports she Is having issues sleeping. She has issues both falling asleep and staying asleep. She has tried OTC meds including melatonin - no relief.      Today's Vitals   11/04/19 0923  BP: 124/70  Pulse: 76  Temp: 98.1 F (36.7 C)  TempSrc: Oral  Weight: 157 lb 6.4 oz (71.4 kg)  Height: 5' 4" (1.626 m)   Body mass index is 27.02 kg/m.   Objective:  Physical Exam Vitals and nursing note reviewed.  Constitutional:      Appearance: Normal appearance.  HENT:     Head: Normocephalic and atraumatic.     Right Ear: Tympanic membrane, ear canal and external ear normal.     Left Ear: Tympanic membrane, ear canal and external ear normal.     Nose:     Comments: Deferred, masked    Mouth/Throat:     Comments: Deferred, masked Eyes:     Extraocular Movements: Extraocular movements intact.     Conjunctiva/sclera: Conjunctivae normal.     Pupils: Pupils are equal, round, and reactive to light.  Neck:     Thyroid: Thyromegaly present.  Cardiovascular:     Rate and Rhythm: Normal rate and regular rhythm.     Pulses: Normal pulses.          Dorsalis pedis pulses are 2+ on the right side and 2+ on the left side.     Heart sounds: Normal heart sounds.  Pulmonary:     Effort: Pulmonary effort is normal.     Breath sounds: Normal breath sounds.  Chest:     Breasts: Tanner Score  is 5.        Right: Normal.        Left: Normal.  Abdominal:     General: Bowel sounds are normal.     Palpations: Abdomen is soft.     Comments: Rounded, soft  Genitourinary:    Comments: deferred Musculoskeletal:        General: Normal range of motion.     Cervical back: Normal range of motion and neck supple.  Feet:     Right foot:     Protective Sensation: 5 sites tested. 5 sites sensed.     Skin integrity: Callus and dry skin present. No ulcer.     Toenail Condition: Right toenails are normal.     Left foot:     Protective Sensation: 5 sites tested. 5 sites sensed.  Skin integrity: Callus and dry skin present. No ulcer.     Toenail Condition: Left toenails are normal.  Skin:    General: Skin is warm and dry.  Neurological:     General: No focal deficit present.     Mental Status: She is alert and oriented to person, place, and time.  Psychiatric:        Mood and Affect: Mood normal.        Behavior: Behavior normal.         Assessment And Plan:     1. Routine general medical examination at health care facility  A full exam was performed.  Importance of monthly self breast exams was discussed with the patient. I will also refer her to GYN as requested. PATIENT IS ADVISED TO GET 30-45 MINUTES REGULAR EXERCISE NO LESS THAN FOUR TO FIVE DAYS PER WEEK - BOTH WEIGHTBEARING EXERCISES AND AEROBIC ARE RECOMMENDED.  SHE IS ADVISED TO FOLLOW A HEALTHY DIET WITH AT LEAST SIX FRUITS/VEGGIES PER DAY, DECREASE INTAKE OF RED MEAT, AND TO INCREASE FISH INTAKE TO TWO DAYS PER WEEK.  MEATS/FISH SHOULD NOT BE FRIED, BAKED OR BROILED IS PREFERABLE.  I SUGGEST WEARING SPF 50 SUNSCREEN ON EXPOSED PARTS AND ESPECIALLY WHEN IN THE DIRECT SUNLIGHT FOR AN EXTENDED PERIOD OF TIME.  PLEASE AVOID FAST FOOD RESTAURANTS AND INCREASE YOUR WATER INTAKE.  - CMP14+EGFR - CBC - Lipid panel - Hemoglobin A1c - Ambulatory referral to Gynecology  2. Type 2 diabetes mellitus with diabetic neuropathy, with  long-term current use of insulin (HCC)  Diabetic foot exam was performed.  I DISCUSSED WITH THE PATIENT AT LENGTH REGARDING THE GOALS OF GLYCEMIC CONTROL AND POSSIBLE LONG-TERM COMPLICATIONS.  I  ALSO STRESSED THE IMPORTANCE OF COMPLIANCE WITH HOME GLUCOSE MONITORING, DIETARY RESTRICTIONS INCLUDING AVOIDANCE OF SUGARY DRINKS/PROCESSED FOODS,  ALONG WITH REGULAR EXERCISE.  I  ALSO STRESSED THE IMPORTANCE OF ANNUAL EYE EXAMS, SELF FOOT CARE AND COMPLIANCE WITH OFFICE VISITS.  - POCT Urinalysis Dipstick (81002) - POCT UA - Microalbumin  3. Essential (primary) hypertension  Chronic, well controlled. She will continue with current meds. She is encouraged to avoid adding salt to her foods. EKG performed, NSR w/o acute changes.   - EKG 12-Lead  4. Adjustment insomnia  She has failed multiple OTC options.  She was given samples of Dayvigo, 5mg to take nightly as needed. She is advised to take no later than 10pm. She is also encouraged to create a bedtime routine.   5. Goiter  She agrees to thyroid u/s. Pt advised someone will contact her with appt date/time.   - US THYROID; Future    N , MD    THE PATIENT IS ENCOURAGED TO PRACTICE SOCIAL DISTANCING DUE TO THE COVID-19 PANDEMIC.   

## 2019-11-07 NOTE — Patient Instructions (Signed)
Health Maintenance, Female Adopting a healthy lifestyle and getting preventive care are important in promoting health and wellness. Ask your health care provider about:  The right schedule for you to have regular tests and exams.  Things you can do on your own to prevent diseases and keep yourself healthy. What should I know about diet, weight, and exercise? Eat a healthy diet   Eat a diet that includes plenty of vegetables, fruits, low-fat dairy products, and lean protein.  Do not eat a lot of foods that are high in solid fats, added sugars, or sodium. Maintain a healthy weight Body mass index (BMI) is used to identify weight problems. It estimates body fat based on height and weight. Your health care provider can help determine your BMI and help you achieve or maintain a healthy weight. Get regular exercise Get regular exercise. This is one of the most important things you can do for your health. Most adults should:  Exercise for at least 150 minutes each week. The exercise should increase your heart rate and make you sweat (moderate-intensity exercise).  Do strengthening exercises at least twice a week. This is in addition to the moderate-intensity exercise.  Spend less time sitting. Even light physical activity can be beneficial. Watch cholesterol and blood lipids Have your blood tested for lipids and cholesterol at 64 years of age, then have this test every 5 years. Have your cholesterol levels checked more often if:  Your lipid or cholesterol levels are high.  You are older than 64 years of age.  You are at high risk for heart disease. What should I know about cancer screening? Depending on your health history and family history, you may need to have cancer screening at various ages. This may include screening for:  Breast cancer.  Cervical cancer.  Colorectal cancer.  Skin cancer.  Lung cancer. What should I know about heart disease, diabetes, and high blood  pressure? Blood pressure and heart disease  High blood pressure causes heart disease and increases the risk of stroke. This is more likely to develop in people who have high blood pressure readings, are of African descent, or are overweight.  Have your blood pressure checked: ? Every 3-5 years if you are 18-39 years of age. ? Every year if you are 40 years old or older. Diabetes Have regular diabetes screenings. This checks your fasting blood sugar level. Have the screening done:  Once every three years after age 40 if you are at a normal weight and have a low risk for diabetes.  More often and at a younger age if you are overweight or have a high risk for diabetes. What should I know about preventing infection? Hepatitis B If you have a higher risk for hepatitis B, you should be screened for this virus. Talk with your health care provider to find out if you are at risk for hepatitis B infection. Hepatitis C Testing is recommended for:  Everyone born from 1945 through 1965.  Anyone with known risk factors for hepatitis C. Sexually transmitted infections (STIs)  Get screened for STIs, including gonorrhea and chlamydia, if: ? You are sexually active and are younger than 64 years of age. ? You are older than 64 years of age and your health care provider tells you that you are at risk for this type of infection. ? Your sexual activity has changed since you were last screened, and you are at increased risk for chlamydia or gonorrhea. Ask your health care provider if   you are at risk.  Ask your health care provider about whether you are at high risk for HIV. Your health care provider may recommend a prescription medicine to help prevent HIV infection. If you choose to take medicine to prevent HIV, you should first get tested for HIV. You should then be tested every 3 months for as long as you are taking the medicine. Pregnancy  If you are about to stop having your period (premenopausal) and  you may become pregnant, seek counseling before you get pregnant.  Take 400 to 800 micrograms (mcg) of folic acid every day if you become pregnant.  Ask for birth control (contraception) if you want to prevent pregnancy. Osteoporosis and menopause Osteoporosis is a disease in which the bones lose minerals and strength with aging. This can result in bone fractures. If you are 65 years old or older, or if you are at risk for osteoporosis and fractures, ask your health care provider if you should:  Be screened for bone loss.  Take a calcium or vitamin D supplement to lower your risk of fractures.  Be given hormone replacement therapy (HRT) to treat symptoms of menopause. Follow these instructions at home: Lifestyle  Do not use any products that contain nicotine or tobacco, such as cigarettes, e-cigarettes, and chewing tobacco. If you need help quitting, ask your health care provider.  Do not use street drugs.  Do not share needles.  Ask your health care provider for help if you need support or information about quitting drugs. Alcohol use  Do not drink alcohol if: ? Your health care provider tells you not to drink. ? You are pregnant, may be pregnant, or are planning to become pregnant.  If you drink alcohol: ? Limit how much you use to 0-1 drink a day. ? Limit intake if you are breastfeeding.  Be aware of how much alcohol is in your drink. In the [place] one drink equals one 12 oz bottle of beer (355 mL), one 5 oz glass of wine (148 mL), or one 1 oz glass of hard liquor (44 mL). General instructions  Schedule regular health, dental, and eye exams.  Stay current with your vaccines.  Tell your health care provider if: ? You often feel depressed. ? You have ever been abused or do not feel safe at home. Summary  Adopting a healthy lifestyle and getting preventive care are important in promoting health and wellness.  Follow your health care provider's instructions about healthy  diet, exercising, and getting tested or screened for diseases.  Follow your health care provider's instructions on monitoring your cholesterol and blood pressure. This information is not intended to replace advice given to you by your health care provider. Make sure you discuss any questions you have with your health care provider. Document Revised: 05/08/2018 Document Reviewed: 05/08/2018 Elsevier Patient Education  2020 Elsevier Inc.  

## 2019-11-09 ENCOUNTER — Other Ambulatory Visit: Payer: Self-pay | Admitting: Internal Medicine

## 2019-11-10 ENCOUNTER — Other Ambulatory Visit: Payer: Self-pay

## 2019-11-10 MED ORDER — LISINOPRIL-HYDROCHLOROTHIAZIDE 20-25 MG PO TABS: ORAL_TABLET | ORAL | 1 refills | Status: DC

## 2019-11-13 ENCOUNTER — Other Ambulatory Visit: Payer: Self-pay | Admitting: Internal Medicine

## 2019-11-13 ENCOUNTER — Ambulatory Visit
Admission: RE | Admit: 2019-11-13 | Discharge: 2019-11-13 | Disposition: A | Discharge status: Home / Self Care | Payer: Federal, State, Local not specified - PPO | Source: Ambulatory Visit | Attending: Internal Medicine | Admitting: Internal Medicine

## 2019-11-13 DIAGNOSIS — E049 Nontoxic goiter, unspecified: Secondary | ICD-10-CM

## 2019-11-17 ENCOUNTER — Other Ambulatory Visit: Payer: Self-pay | Admitting: Internal Medicine

## 2019-11-17 ENCOUNTER — Telehealth: Payer: Self-pay

## 2019-11-17 NOTE — Telephone Encounter (Signed)
The pt called and said that the dayvigo is helping her to sleep and that she would like a prescription sent to [facility] and that she has a savings card.

## 2019-11-18 ENCOUNTER — Ambulatory Visit
Admission: RE | Admit: 2019-11-18 | Discharge: 2019-11-18 | Disposition: A | Discharge status: Home / Self Care | Payer: Federal, State, Local not specified - PPO | Source: Ambulatory Visit | Attending: Internal Medicine | Admitting: Internal Medicine

## 2019-11-18 DIAGNOSIS — E049 Nontoxic goiter, unspecified: Secondary | ICD-10-CM

## 2019-11-18 DIAGNOSIS — E041 Nontoxic single thyroid nodule: Secondary | ICD-10-CM | POA: Diagnosis not present

## 2019-12-15 ENCOUNTER — Ambulatory Visit (INDEPENDENT_AMBULATORY_CARE_PROVIDER_SITE_OTHER): Payer: Federal, State, Local not specified - PPO | Admitting: Obstetrics & Gynecology

## 2019-12-15 ENCOUNTER — Encounter: Payer: Self-pay | Admitting: Obstetrics & Gynecology

## 2019-12-15 ENCOUNTER — Other Ambulatory Visit (HOSPITAL_COMMUNITY)
Admission: RE | Admit: 2019-12-15 | Discharge: 2019-12-15 | Disposition: A | Discharge status: Home / Self Care | Payer: Federal, State, Local not specified - PPO | Source: Ambulatory Visit | Attending: Obstetrics & Gynecology | Admitting: Obstetrics & Gynecology

## 2019-12-15 ENCOUNTER — Other Ambulatory Visit: Payer: Self-pay

## 2019-12-15 ENCOUNTER — Other Ambulatory Visit: Payer: Self-pay | Admitting: Obstetrics & Gynecology

## 2019-12-15 VITALS — BP 105/68 | HR 99 | Ht 63.0 in | Wt 156.0 lb

## 2019-12-15 DIAGNOSIS — Z1231 Encounter for screening mammogram for malignant neoplasm of breast: Secondary | ICD-10-CM

## 2019-12-15 DIAGNOSIS — Z01419 Encounter for gynecological examination (general) (routine) without abnormal findings: Secondary | ICD-10-CM | POA: Insufficient documentation

## 2019-12-15 NOTE — Progress Notes (Signed)
GYNECOLOGY ANNUAL PREVENTATIVE CARE ENCOUNTER NOTE  History:     Sylvia Kelly is a 64 y.o. G2P1011 female here for a routine annual gynecologic exam.  Current complaints: none.   Denies abnormal vaginal bleeding, discharge, pelvic pain, problems with intercourse or other gynecologic concerns.    Gynecologic History No LMP recorded. Patient is postmenopausal. Contraception: post menopausal status Last Pap: 01/04/2017. Results were: normal with negative HPV Last mammogram: 12/26/2017. Results were: normal  Obstetric History OB History  Gravida Para Term Preterm AB Living  2 1 1   1 1   SAB TAB Ectopic Multiple Live Births               # Outcome Date GA Lbr Len/2nd Weight Sex Delivery Anes PTL Lv  2 AB           1 Term             Past Medical History:  Diagnosis Date  . Atrophic vaginitis   . Diabetes mellitus   . Elevated cholesterol   . Hypertension     Past Surgical History:  Procedure Laterality Date  . OVARIAN CYST SURGERY  1992  . REFRACTIVE SURGERY    . TUBAL LIGATION      Current Outpatient Medications on File Prior to Visit  Medication Sig Dispense Refill  . ALPRAZolam (XANAX) 0.5 MG tablet Take 1 tablet (0.5 mg total) by mouth 2 (two) times daily as needed for up to 30 doses for anxiety or sleep. 30 tablet 0  . aspirin 81 MG tablet Take 81 mg by mouth daily.      Marland Kitchen atorvastatin (LIPITOR) 80 MG tablet TAKE 1 TABLET BY MOUTH EVERY DAY 90 tablet 1  . B-D UF III MINI PEN NEEDLES 31G X 5 MM MISC USE AS DIRECTED WITH LEVEMIR 150 each 3  . Continuous Blood Gluc Sensor (FREESTYLE LIBRE 14 DAY SENSOR) MISC INJECT 1 EACH INTO THE SKIN QID. USE AS DIRECTED TO CHECK BLOOD SUGARS 4 TIMES PER DAY DX:E11.65 6 each 2  . ibuprofen (ADVIL,MOTRIN) 800 MG tablet Take 1 tablet (800 mg total) by mouth 3 (three) times daily. 21 tablet 0  . lisinopril-hydrochlorothiazide (ZESTORETIC) 20-25 MG tablet Take 1/2 tablet by mouth daily 45 tablet 1  . Multiple Vitamin (MULTIVITAMIN)  tablet Take 1 tablet by mouth daily.      Marland Kitchen OZEMPIC, 1 MG/DOSE, 2 MG/1.5ML SOPN INJECT 1 MG INTO THE SKIN ONCE A WEEK. 6 pen 1  . TRESIBA FLEXTOUCH 200 UNIT/ML SOPN INJECT 56 UNITS INTO THE SKIN AT BEDTIME. 9 pen 1   No current facility-administered medications on file prior to visit.    No Known Allergies  Social History:  reports that she has never smoked. She has never used smokeless tobacco. She reports that she does not drink alcohol and does not use drugs.  Family History  Problem Relation Age of Onset  . Heart disease Mother   . Diabetes Mother   . Cancer Father        Lung cancer    The following portions of the patient's history were reviewed and updated as appropriate: allergies, current medications, past family history, past medical history, past social history, past surgical history and problem list.  Review of Systems Pertinent items noted in HPI and remainder of comprehensive ROS otherwise negative.  Physical Exam:  BP 105/68   Pulse 99   Ht 5\' 3"  (1.6 m)   Wt 156 lb (70.8 kg)   BMI  27.63 kg/m  CONSTITUTIONAL: Well-developed, well-nourished female in no acute distress.  HENT:  Normocephalic, atraumatic, External right and left ear normal. Oropharynx is clear and moist EYES: Conjunctivae and EOM are normal. Pupils are equal, round, and reactive to light. No scleral icterus.  NECK: Normal range of motion, supple, no masses.  Normal thyroid.  SKIN: Skin is warm and dry. No rash noted. Not diaphoretic. No erythema. No pallor. MUSCULOSKELETAL: Normal range of motion. No tenderness.  No cyanosis, clubbing, or edema.  2+ distal pulses. NEUROLOGIC: Alert and oriented to person, place, and time. Normal reflexes, muscle tone coordination.  PSYCHIATRIC: Normal mood and affect. Normal behavior. Normal judgment and thought content. CARDIOVASCULAR: Normal heart rate noted, regular rhythm RESPIRATORY: Clear to auscultation bilaterally. Effort and breath sounds normal, no  problems with respiration noted. BREASTS: Symmetric in size. No masses, tenderness, skin changes, nipple drainage, or lymphadenopathy bilaterally. Performed in the presence of a chaperone. ABDOMEN: Soft, no distention noted.  No tenderness, rebound or guarding.  PELVIC: Normal appearing external genitalia and urethral meatus; normal appearing vaginal mucosa and cervix with moderate atrophy.  No abnormal discharge noted.  Pap smear obtained.  Normal uterine size, no other palpable masses, no uterine or adnexal tenderness.  Performed in the presence of a chaperone.   Assessment and Plan:    1. Well woman exam with routine gynecological exam - Cytology - PAP Will follow up results of pap smear and manage accordingly. Mammogram scheduled Routine preventative health maintenance measures emphasized. Please refer to After Visit Summary for other counseling recommendations.      Verita Schneiders, MD, [place] [facility] [employer] [facility], [place]

## 2019-12-15 NOTE — Patient Instructions (Signed)

## 2019-12-18 LAB — CYTOLOGY - PAP
Comment: NEGATIVE
Diagnosis: NEGATIVE
High risk HPV: NEGATIVE

## 2019-12-21 ENCOUNTER — Other Ambulatory Visit: Payer: Self-pay | Admitting: Internal Medicine

## 2020-01-01 ENCOUNTER — Other Ambulatory Visit: Payer: Self-pay | Admitting: Internal Medicine

## 2020-01-01 ENCOUNTER — Other Ambulatory Visit: Payer: Self-pay

## 2020-01-01 ENCOUNTER — Ambulatory Visit
Admission: RE | Admit: 2020-01-01 | Discharge: 2020-01-01 | Disposition: A | Discharge status: Home / Self Care | Payer: Federal, State, Local not specified - PPO | Source: Ambulatory Visit | Attending: Obstetrics & Gynecology | Admitting: Obstetrics & Gynecology

## 2020-01-01 DIAGNOSIS — Z1231 Encounter for screening mammogram for malignant neoplasm of breast: Secondary | ICD-10-CM | POA: Diagnosis not present

## 2020-01-13 DIAGNOSIS — Z0289 Encounter for other administrative examinations: Secondary | ICD-10-CM

## 2020-01-19 ENCOUNTER — Other Ambulatory Visit: Payer: Self-pay

## 2020-01-19 ENCOUNTER — Telehealth: Payer: Self-pay

## 2020-01-19 MED ORDER — ONETOUCH VERIO REFLECT W/DEVICE KIT: PACK | 1 refills | Status: DC

## 2020-01-19 MED ORDER — ONETOUCH DELICA LANCETS 33G MISC: 3 refills | Status: DC

## 2020-01-19 MED ORDER — ONETOUCH VERIO VI STRP: ORAL_STRIP | 3 refills | Status: DC

## 2020-01-19 NOTE — Telephone Encounter (Signed)
Spoke w/pt about Sylvia Kelly she stated it cost $87 per KW pt can pick up samples but she should reach out to pharmacy and insurance company to see why it cost so much.  Pt also asked for a Glucose meter she stated the [place] don't last 2wks. Kit sent to pharmacy

## 2020-02-06 ENCOUNTER — Encounter: Payer: Self-pay | Admitting: Internal Medicine

## 2020-02-06 ENCOUNTER — Other Ambulatory Visit: Payer: Self-pay

## 2020-02-09 ENCOUNTER — Other Ambulatory Visit: Payer: Self-pay

## 2020-02-09 ENCOUNTER — Encounter: Payer: Self-pay | Admitting: Internal Medicine

## 2020-02-09 NOTE — Telephone Encounter (Signed)
I called the pt to let her know that the number that she left for [employer] mail order isn't working. [phone].

## 2020-02-18 DIAGNOSIS — E119 Type 2 diabetes mellitus without complications: Secondary | ICD-10-CM | POA: Diagnosis not present

## 2020-02-26 ENCOUNTER — Encounter: Payer: Self-pay | Admitting: Internal Medicine

## 2020-02-26 ENCOUNTER — Other Ambulatory Visit: Payer: Self-pay

## 2020-02-26 MED ORDER — OZEMPIC (1 MG/DOSE) 2 MG/1.5ML ~~LOC~~ SOPN: PEN_INJECTOR | SUBCUTANEOUS | 2 refills | Status: DC

## 2020-02-26 MED ORDER — TRESIBA FLEXTOUCH 200 UNIT/ML ~~LOC~~ SOPN: 56.0000 [IU] | PEN_INJECTOR | Freq: Every day | SUBCUTANEOUS | 2 refills | Status: DC

## 2020-03-12 ENCOUNTER — Encounter: Payer: Self-pay | Admitting: Internal Medicine

## 2020-03-26 ENCOUNTER — Ambulatory Visit: Payer: Federal, State, Local not specified - PPO

## 2020-04-09 ENCOUNTER — Other Ambulatory Visit: Payer: Self-pay

## 2020-04-09 ENCOUNTER — Ambulatory Visit (INDEPENDENT_AMBULATORY_CARE_PROVIDER_SITE_OTHER): Payer: Federal, State, Local not specified - PPO

## 2020-04-09 VITALS — BP 114/68 | HR 83 | Temp 98.2°F | Ht 63.0 in | Wt 158.0 lb

## 2020-04-09 DIAGNOSIS — Z23 Encounter for immunization: Secondary | ICD-10-CM

## 2020-04-09 NOTE — Addendum Note (Signed)
Addended by: Michelle Nasuti on: 04/09/2020 11:01 AM   Modules accepted: Orders

## 2020-04-09 NOTE — Progress Notes (Signed)
The pt was given her flu vaccination.

## 2020-05-02 ENCOUNTER — Other Ambulatory Visit: Payer: Self-pay | Admitting: Internal Medicine

## 2020-06-03 ENCOUNTER — Other Ambulatory Visit: Payer: Self-pay | Admitting: Internal Medicine

## 2020-06-03 MED ORDER — DAYVIGO 10 MG PO TABS: ORAL_TABLET | ORAL | 1 refills | Status: DC

## 2020-06-09 ENCOUNTER — Encounter: Payer: Self-pay | Admitting: Nurse Practitioner

## 2020-06-09 ENCOUNTER — Other Ambulatory Visit: Payer: Self-pay

## 2020-06-09 ENCOUNTER — Ambulatory Visit (INDEPENDENT_AMBULATORY_CARE_PROVIDER_SITE_OTHER): Payer: Federal, State, Local not specified - PPO | Admitting: Nurse Practitioner

## 2020-06-09 VITALS — BP 132/78 | HR 80 | Temp 98.1°F | Ht 64.8 in | Wt 167.4 lb

## 2020-06-09 DIAGNOSIS — E663 Overweight: Secondary | ICD-10-CM

## 2020-06-09 DIAGNOSIS — I10 Essential (primary) hypertension: Secondary | ICD-10-CM

## 2020-06-09 DIAGNOSIS — E7800 Pure hypercholesterolemia, unspecified: Secondary | ICD-10-CM

## 2020-06-09 DIAGNOSIS — Z794 Long term (current) use of insulin: Secondary | ICD-10-CM

## 2020-06-09 DIAGNOSIS — E1140 Type 2 diabetes mellitus with diabetic neuropathy, unspecified: Secondary | ICD-10-CM

## 2020-06-09 DIAGNOSIS — Z6828 Body mass index (BMI) 28.0-28.9, adult: Secondary | ICD-10-CM

## 2020-06-09 DIAGNOSIS — R197 Diarrhea, unspecified: Secondary | ICD-10-CM

## 2020-06-09 MED ORDER — TRESIBA FLEXTOUCH 200 UNIT/ML ~~LOC~~ SOPN: 56.0000 [IU] | PEN_INJECTOR | Freq: Every day | SUBCUTANEOUS | 2 refills | Status: DC

## 2020-06-09 MED ORDER — ATORVASTATIN CALCIUM 80 MG PO TABS: 80.0000 mg | ORAL_TABLET | Freq: Every day | ORAL | 1 refills | Status: DC

## 2020-06-09 MED ORDER — LISINOPRIL-HYDROCHLOROTHIAZIDE 20-25 MG PO TABS: ORAL_TABLET | ORAL | 2 refills | Status: DC

## 2020-06-09 MED ORDER — OZEMPIC (1 MG/DOSE) 4 MG/3ML ~~LOC~~ SOPN: 1.0000 mg | PEN_INJECTOR | SUBCUTANEOUS | 1 refills | Status: DC

## 2020-06-09 NOTE — Progress Notes (Signed)
I,Tianna Badgett,acting as a Education administrator for [employer], NP.,have documented all relevant documentation on the behalf of [employer], NP,as directed by  Bary Castilla, NP while in the presence of Bary Castilla, NP.  This visit occurred during the SARS-CoV-2 public health emergency.  Safety protocols were in place, including screening questions prior to the visit, additional usage of staff PPE, and extensive cleaning of exam room while observing appropriate contact time as indicated for disinfecting solutions.  Subjective:     Patient ID: Sylvia Kelly , female    DOB: [birth year] , 65 y.o.   MRN: [id number]   Chief Complaint  Patient presents with  . Diabetes    HPI  She presents today for DM/HTN check. She reports compliance with some of her medications.  Hasn't been keeping track of sugars lately. She has not taken her ozempic in at least a month. She is taking the triseba at nighttime. She is suffering from diarrhea today.She is having runny stools which she thinks could be due to what she ate last night (chinese food).    Diet: she is not eating healthy. She does confess to eating chinese food a lot.   Exercise: she has been walking twice a week. She walks a mile and half.   Wt Readings from Last 3 Encounters: 06/09/20 : 167 lb 6.4 oz (75.9 kg) 04/09/20 : 158 lb (71.7 kg) 12/15/19 : 156 lb (70.8 kg)   Diabetes She presents for her follow-up diabetic visit. She has type 2 diabetes mellitus. Her disease course has been improving. There are no hypoglycemic associated symptoms. Pertinent negatives for hypoglycemia include no headaches. Pertinent negatives for diabetes include no blurred vision, no chest pain, no fatigue, no polydipsia, no polyphagia and no polyuria. There are no hypoglycemic complications. Diabetic complications include peripheral neuropathy. Risk factors for coronary artery disease include diabetes mellitus, dyslipidemia, sedentary lifestyle, hypertension  and post-menopausal. She is compliant with treatment some of the time. She is following a generally unhealthy diet. When asked about meal planning, she reported none. She has not had a previous visit with a dietitian. She participates in exercise three times a week. (She is running high for the last week. It was in the 140s-250s. ) An ACE inhibitor/angiotensin II receptor blocker is being taken. She does not see a podiatrist.Eye exam is current.  Hypertension This is a chronic problem. The current episode started more than 1 year ago. The problem has been gradually improving since onset. Pertinent negatives include no blurred vision, chest pain, headaches, palpitations or shortness of breath. Risk factors for coronary artery disease include diabetes mellitus and dyslipidemia. Past treatments include ACE inhibitors. The current treatment provides moderate improvement. There are no compliance problems.   Diarrhea  This is a new problem. The current episode started today. The stool consistency is described as watery. The patient states that diarrhea does not awaken her from sleep. Pertinent negatives include no abdominal pain, chills, coughing, fever, headaches or myalgias. There are no known risk factors. She has tried change of diet and increased fluids for the symptoms. The treatment provided moderate relief.     Past Medical History:  Diagnosis Date  . Atrophic vaginitis   . Diabetes mellitus   . Elevated cholesterol   . Hypertension      Family History  Problem Relation Age of Onset  . Heart disease Mother   . Diabetes Mother   . Cancer Father        Lung cancer  Current Outpatient Medications:  .  aspirin 81 MG tablet, Take 81 mg by mouth daily., Disp: , Rfl:  .  B-D UF III MINI PEN NEEDLES 31G X 5 MM MISC, USE AS DIRECTED WITH LEVEMIR, Disp: 150 each, Rfl: 3 .  Blood Glucose Monitoring Suppl (ONETOUCH VERIO REFLECT) w/Device KIT, Use as directed to check blood sugars 3 times per day  dx: e11.65, Disp: 1 kit, Rfl: 1 .  ibuprofen (ADVIL,MOTRIN) 800 MG tablet, Take 1 tablet (800 mg total) by mouth 3 (three) times daily., Disp: 21 tablet, Rfl: 0 .  Multiple Vitamin (MULTIVITAMIN) tablet, Take 1 tablet by mouth daily., Disp: , Rfl:  .  OneTouch Delica Lancets 20U MISC, Use as directed to check blood sugars 3 times per day dx: e11.65, Disp: 150 each, Rfl: 3 .  ONETOUCH VERIO test strip, Use as directed to check blood sugars 3 times per day dx: e11.65, Disp: 150 each, Rfl: 3 .  Semaglutide, 1 MG/DOSE, (OZEMPIC, 1 MG/DOSE,) 2 MG/1.5ML SOPN, INJECT 1 MG INTO THE SKIN ONCE A WEEK., Disp: 4.5 mL, Rfl: 2 .  atorvastatin (LIPITOR) 80 MG tablet, Take 1 tablet (80 mg total) by mouth daily., Disp: 90 tablet, Rfl: 1 .  insulin degludec (TRESIBA FLEXTOUCH) 200 UNIT/ML FlexTouch Pen, Inject 56 Units into the skin at bedtime., Disp: 27 mL, Rfl: 2 .  lisinopril-hydrochlorothiazide (ZESTORETIC) 20-25 MG tablet, TAKE 1/2 TABLET BY MOUTH EVERY DAY, Disp: 45 tablet, Rfl: 2 .  Semaglutide, 1 MG/DOSE, (OZEMPIC, 1 MG/DOSE,) 4 MG/3ML SOPN, Inject 1 mg into the skin once a week., Disp: 9 mL, Rfl: 1   No Known Allergies   Review of Systems  Constitutional: Negative for chills, fatigue and fever.  Eyes: Negative for blurred vision.  Respiratory: Negative for cough and shortness of breath.   Cardiovascular: Negative for chest pain and palpitations.  Gastrointestinal: Positive for diarrhea. Negative for abdominal pain.  Endocrine: Negative for polydipsia, polyphagia and polyuria.  Musculoskeletal: Negative for myalgias.  Neurological: Negative for numbness and headaches.       Some tingling in both of her hands.      Today's Vitals   06/09/20 1015  BP: 132/78  Pulse: 80  Temp: 98.1 F (36.7 C)  TempSrc: Oral  Weight: 167 lb 6.4 oz (75.9 kg)  Height: 5' 4.8" (1.646 m)   Body mass index is 28.03 kg/m.  Wt Readings from Last 3 Encounters:  06/09/20 167 lb 6.4 oz (75.9 kg)  04/09/20 158 lb  (71.7 kg)  12/15/19 156 lb (70.8 kg)    Objective:  Physical Exam Constitutional:      Appearance: Normal appearance. She is obese.  HENT:     Head: Normocephalic and atraumatic.  Cardiovascular:     Rate and Rhythm: Normal rate and regular rhythm.     Pulses: Normal pulses.     Heart sounds: Normal heart sounds.  Pulmonary:     Effort: Pulmonary effort is normal. No respiratory distress.     Breath sounds: Normal breath sounds. No wheezing.  Abdominal:     General: Bowel sounds are normal.  Skin:    General: Skin is warm and dry.     Capillary Refill: Capillary refill takes less than 2 seconds.  Neurological:     Mental Status: She is alert.  Psychiatric:        Mood and Affect: Mood normal.        Behavior: Behavior normal.        Thought Content: Thought content  normal.        Judgment: Judgment normal.         Assessment And Plan:     1. Type 2 diabetes mellitus with diabetic neuropathy, with long-term current use of insulin (HCC) - Hemoglobin A1c - CMP14+EGFR -Advised patient to start checking her CBG at home daily and keep a log  -Educated patient to start her Ozempic which she had stopped.  -Prescription for Ozempic 1 mg sent to pharmacy  -Continue taking Triseba. -Educated patient about healthy diabetic diet which includes to eat fresh fruits and vegetables and avoid carbs, and sugary foods and drinks.   2. Essential (primary) hypertension -Continue taking the lisinopril 20/25. Refill sent  -Limit the intake of processed foods and salt intake. You should increase your intake of green vegetables and fruits. Limit the use of alcohol. Limit fast foods and fried foods. Avoid high fatty saturated and trans fat foods. Keep yourself hydrated with drinking water. Avoid red meats. Eat lean meats instead. Exercise for atleast 30-45 min for atleast 4-5 times a week.  - CMP14+EGFR  3. Elevated cholesterol -Continue taking atorvastatin 80 mg. Refill sent  -Educated  patient about diet focusing on lean meat, avoiding fast foods, limiting high fatty foods. Increase intake of fish and fiber.  - Lipid panel  4. Diarrhea, unspecified type -Started this morning, educated patient to take OTC imodium  -Stay hydrated with plenty of water and electrolytes.  -Call or seek emergency care if symptoms do not improve, or worsen.    5. Overweight with body mass index (BMI) of 28 to 28.9 in adult She is encouraged to strive for BMI less than 26 to decrease cardiac risk. Advised to aim for at least 150 minutes of exercise per week.    Staying healthy and adopting a healthy lifestyle for your overall health. You should eat 7 or more servings of fruits and vegetables per day. You should drink plenty of water to keep yourself hydrated and your kidneys healthy. This includes about 65-80+ fluid ounces of water. Limit your intake of animal fats especially for elevated cholesterol. Avoid highly processed food and limit your salt intake if you have hypertension. Avoid foods high in saturated/Trans fats. Along with a healthy diet it is also very important to maintain time for yourself to maintain a healthy mental health with low stress levels. You should get 150 min of moderate intensity exercise weekly for a healthy heart. Along with eating right and exercising aim for at least 7-9 hours of sleep daily.  Eat more whole grains which includes barley, wheat berries, oats, brown rice and whole wheat pasta. Use healthy plant oils which include olive, soy, corn, sunflower and peanut. Limit your caffeine and sugary drinks. Limit milk and dairy products to one or two daily servings.    Patient was given opportunity to ask questions. Patient verbalized understanding of the plan and was able to repeat key elements of the plan. All questions were answered to their satisfaction.  Bary Castilla, NP   I, Bary Castilla, NP, have reviewed all documentation for this visit. The documentation on  06/09/20 for the exam, diagnosis, procedures, and orders are all accurate and complete.  THE PATIENT IS ENCOURAGED TO PRACTICE SOCIAL DISTANCING DUE TO THE COVID-19 PANDEMIC.

## 2020-06-09 NOTE — Patient Instructions (Signed)
Diabetes Mellitus and Exercise Exercising regularly is important for overall health, especially for people who have diabetes mellitus. Exercising is not only about losing weight. It has many other health benefits, such as increasing muscle strength and bone density and reducing body fat and stress. This leads to improved fitness, flexibility, and endurance, all of which result in better overall health. What are the benefits of exercise if I have diabetes? Exercise has many benefits for people with diabetes. They include:  Helping to lower and control blood sugar (glucose).  Helping the body to respond better to the hormone insulin by improving insulin sensitivity.  Reducing how much insulin the body needs.  Lowering the risk for heart disease by: ? Lowering "bad" cholesterol and triglyceride levels. ? Increasing "good" cholesterol levels. ? Lowering blood pressure. ? Lowering blood glucose levels. What is my activity plan? Your health care provider or certified diabetes educator can help you make a plan for the type and frequency of exercise that works for you. This is called your activity plan. Be sure to:  Get at least 150 minutes of medium-intensity or high-intensity exercise each week. Exercises may include brisk walking, biking, or water aerobics.  Do stretching and strengthening exercises, such as yoga or weight lifting, at least 2 times a week.  Spread out your activity over at least 3 days of the week.  Get some form of physical activity each day. ? Do not go more than 2 days in a row without some kind of physical activity. ? Avoid being inactive for more than 90 minutes at a time. Take frequent breaks to walk or stretch.  Choose exercises or activities that you enjoy. Set realistic goals.  Start slowly and gradually increase your exercise intensity over time.   How do I manage my diabetes during exercise? Monitor your blood glucose  Check your blood glucose before and  after exercising. If your blood glucose is: ? 240 mg/dL (13.3 mmol/L) or higher before you exercise, check your urine for ketones. These are chemicals created by the liver. If you have ketones in your urine, do not exercise until your blood glucose returns to normal. ? 100 mg/dL (5.6 mmol/L) or lower, eat a snack containing 15-20 grams of carbohydrate. Check your blood glucose 15 minutes after the snack to make sure that your glucose level is above 100 mg/dL (5.6 mmol/L) before you start your exercise.  Know the symptoms of low blood glucose (hypoglycemia) and how to treat it. Your risk for hypoglycemia increases during and after exercise. Follow these tips and your health care provider's instructions  Keep a carbohydrate snack that is fast-acting for use before, during, and after exercise to help prevent or treat hypoglycemia.  Avoid injecting insulin into areas of the body that are going to be exercised. For example, avoid injecting insulin into: ? Your arms, when you are about to play tennis. ? Your legs, when you are about to go jogging.  Keep records of your exercise habits. Doing this can help you and your health care provider adjust your diabetes management plan as needed. Write down: ? Food that you eat before and after you exercise. ? Blood glucose levels before and after you exercise. ? The type and amount of exercise you have done.  Work with your health care provider when you start a new exercise or activity. He or she may need to: ? Make sure that the activity is safe for you. ? Adjust your insulin, other medicines, and food that   you eat.  Drink plenty of water while you exercise. This prevents loss of water (dehydration) and problems caused by a lot of heat in the body (heat stroke).   Where to find more information  American Diabetes Association: www.diabetes.org Summary  Exercising regularly is important for overall health, especially for people who have diabetes  mellitus.  Exercising has many health benefits. It increases muscle strength and bone density and reduces body fat and stress. It also lowers and controls blood glucose.  Your health care provider or certified diabetes educator can help you make an activity plan for the type and frequency of exercise that works for you.  Work with your health care provider to make sure any new activity is safe for you. Also work with your health care provider to adjust your insulin, other medicines, and the food you eat. This information is not intended to replace advice given to you by your health care provider. Make sure you discuss any questions you have with your health care provider. Document Revised: 02/10/2019 Document Reviewed: 02/10/2019 Elsevier Patient Education  2021 Elsevier Inc.  

## 2020-06-10 LAB — LIPID PANEL
Chol/HDL Ratio: 2.9 ratio (ref 0.0–4.4)
Cholesterol, Total: 178 mg/dL (ref 100–199)
HDL: 61 mg/dL (ref >39)
LDL Chol Calc (NIH): 94 mg/dL (ref 0–99)
Triglycerides: 129 mg/dL (ref 0–149)
VLDL Cholesterol Cal: 23 mg/dL (ref 5–40)

## 2020-06-10 LAB — CMP14+EGFR
ALT: 50 IU/L — ABNORMAL HIGH (ref 0–32)
AST: 31 IU/L (ref 0–40)
Albumin/Globulin Ratio: 1.5 (ref 1.2–2.2)
Albumin: 4 g/dL (ref 3.8–4.8)
Alkaline Phosphatase: 127 IU/L — ABNORMAL HIGH (ref 44–121)
BUN/Creatinine Ratio: 19 (ref 12–28)
BUN: 16 mg/dL (ref 8–27)
Bilirubin Total: 0.5 mg/dL (ref 0.0–1.2)
CO2: 24 mmol/L (ref 20–29)
Calcium: 8.9 mg/dL (ref 8.7–10.3)
Chloride: 103 mmol/L (ref 96–106)
Creatinine, Ser: 0.83 mg/dL (ref 0.57–1.00)
GFR calc Af Amer: 86 mL/min/{1.73_m2} (ref >59)
GFR calc non Af Amer: 75 mL/min/{1.73_m2} (ref >59)
Globulin, Total: 2.6 g/dL (ref 1.5–4.5)
Glucose: 207 mg/dL — ABNORMAL HIGH (ref 65–99)
Potassium: 4.2 mmol/L (ref 3.5–5.2)
Sodium: 140 mmol/L (ref 134–144)
Total Protein: 6.6 g/dL (ref 6.0–8.5)

## 2020-06-10 LAB — HEMOGLOBIN A1C
Est. average glucose Bld gHb Est-mCnc: 249 mg/dL
Hgb A1c MFr Bld: 10.3 % — ABNORMAL HIGH (ref 4.8–5.6)

## 2020-06-23 ENCOUNTER — Other Ambulatory Visit: Payer: Self-pay

## 2020-06-23 ENCOUNTER — Ambulatory Visit: Payer: Federal, State, Local not specified - PPO | Admitting: Nurse Practitioner

## 2020-06-23 ENCOUNTER — Encounter: Payer: Self-pay | Admitting: Nurse Practitioner

## 2020-06-23 VITALS — BP 120/72 | HR 86 | Temp 97.9°F | Ht 64.2 in | Wt 160.8 lb

## 2020-06-23 DIAGNOSIS — J0110 Acute frontal sinusitis, unspecified: Secondary | ICD-10-CM | POA: Diagnosis not present

## 2020-06-23 DIAGNOSIS — J3489 Other specified disorders of nose and nasal sinuses: Secondary | ICD-10-CM

## 2020-06-23 DIAGNOSIS — J0100 Acute maxillary sinusitis, unspecified: Secondary | ICD-10-CM | POA: Diagnosis not present

## 2020-06-23 DIAGNOSIS — R519 Headache, unspecified: Secondary | ICD-10-CM | POA: Diagnosis not present

## 2020-06-23 LAB — POC COVID19 BINAXNOW: SARS Coronavirus 2 Ag: NEGATIVE

## 2020-06-23 MED ORDER — AMOXICILLIN-POT CLAVULANATE 875-125 MG PO TABS: 1.0000 | ORAL_TABLET | Freq: Two times a day (BID) | ORAL | 0 refills | Status: AC

## 2020-06-23 NOTE — Progress Notes (Signed)
I,Tianna Badgett,acting as a Education administrator for [employer], NP.,have documented all relevant documentation on the behalf of [employer], NP,as directed by  Bary Castilla, NP while in the presence of Bary Castilla, NP.  This visit occurred during the SARS-CoV-2 public health emergency.  Safety protocols were in place, including screening questions prior to the visit, additional usage of staff PPE, and extensive cleaning of exam room while observing appropriate contact time as indicated for disinfecting solutions.  Subjective:     Patient ID: Sylvia Kelly , female    DOB: [birth year] , 65 y.o.   MRN: [id number]   Chief Complaint  Patient presents with  . Headache    HPI  Patient is here for a headache today. She states that this has been going on for couple of  days. She has had this type of headache in the past. She is also having a runny nose and some sinus pressure in the front. She took loratadine and some OTC medicine for headache but that did not really help her symptoms. She also feels like her ears feel full.   Headache  This is a new problem. The current episode started in the past 7 days. The problem occurs constantly. The problem has been unchanged. The pain is located in the left unilateral region. The pain does not radiate. The pain quality is similar to prior headaches. The quality of the pain is described as aching. The pain is at a severity of 7/10. The pain is moderate. Associated symptoms include rhinorrhea and sinus pressure. Pertinent negatives include no fever. She has tried acetaminophen for the symptoms. The treatment provided no relief.     Past Medical History:  Diagnosis Date  . Atrophic vaginitis   . Diabetes mellitus   . Elevated cholesterol   . Hypertension      Family History  Problem Relation Age of Onset  . Heart disease Mother   . Diabetes Mother   . Cancer Father        Lung cancer     Current Outpatient Medications:  .   amoxicillin-clavulanate (AUGMENTIN) 875-125 MG tablet, Take 1 tablet by mouth 2 (two) times daily for 7 days., Disp: 14 tablet, Rfl: 0 .  aspirin 81 MG tablet, Take 81 mg by mouth daily., Disp: , Rfl:  .  atorvastatin (LIPITOR) 80 MG tablet, Take 1 tablet (80 mg total) by mouth daily., Disp: 90 tablet, Rfl: 1 .  B-D UF III MINI PEN NEEDLES 31G X 5 MM MISC, USE AS DIRECTED WITH LEVEMIR, Disp: 150 each, Rfl: 3 .  Blood Glucose Monitoring Suppl (ONETOUCH VERIO REFLECT) w/Device KIT, Use as directed to check blood sugars 3 times per day dx: e11.65, Disp: 1 kit, Rfl: 1 .  ibuprofen (ADVIL,MOTRIN) 800 MG tablet, Take 1 tablet (800 mg total) by mouth 3 (three) times daily., Disp: 21 tablet, Rfl: 0 .  insulin degludec (TRESIBA FLEXTOUCH) 200 UNIT/ML FlexTouch Pen, Inject 56 Units into the skin at bedtime., Disp: 27 mL, Rfl: 2 .  lisinopril-hydrochlorothiazide (ZESTORETIC) 20-25 MG tablet, TAKE 1/2 TABLET BY MOUTH EVERY DAY, Disp: 45 tablet, Rfl: 2 .  Multiple Vitamin (MULTIVITAMIN) tablet, Take 1 tablet by mouth daily., Disp: , Rfl:  .  OneTouch Delica Lancets 96E MISC, Use as directed to check blood sugars 3 times per day dx: e11.65, Disp: 150 each, Rfl: 3 .  ONETOUCH VERIO test strip, Use as directed to check blood sugars 3 times per day dx: e11.65, Disp: 150 each, Rfl: 3 .  Semaglutide, 1 MG/DOSE, (OZEMPIC, 1 MG/DOSE,) 2 MG/1.5ML SOPN, INJECT 1 MG INTO THE SKIN ONCE A WEEK., Disp: 4.5 mL, Rfl: 2 .  Semaglutide, 1 MG/DOSE, (OZEMPIC, 1 MG/DOSE,) 4 MG/3ML SOPN, Inject 1 mg into the skin once a week., Disp: 9 mL, Rfl: 1   No Known Allergies   Review of Systems  Constitutional: Negative.  Negative for chills and fever.  HENT: Positive for congestion, rhinorrhea, sinus pressure and sinus pain. Negative for ear discharge and facial swelling.   Respiratory: Negative.  Negative for shortness of breath and wheezing.   Cardiovascular: Negative.  Negative for chest pain.  Gastrointestinal: Negative.    Neurological: Positive for headaches.     Today's Vitals   06/23/20 0910  BP: 120/72  Pulse: 86  Temp: 97.9 F (36.6 C)  TempSrc: Oral  Weight: 160 lb 12.8 oz (72.9 kg)  Height: 5' 4.2" (1.631 m)   Body mass index is 27.43 kg/m.  Wt Readings from Last 3 Encounters:  06/23/20 160 lb 12.8 oz (72.9 kg)  06/09/20 167 lb 6.4 oz (75.9 kg)  04/09/20 158 lb (71.7 kg)    Objective:  Physical Exam Vitals reviewed.  Constitutional:      General: She is not in acute distress.    Appearance: She is obese.  HENT:     Head: Normocephalic and atraumatic.      Comments: Maxillary sinus tenderness upon palpitation     Right Ear: Tympanic membrane normal. There is no impacted cerumen.     Left Ear: Tympanic membrane normal. There is no impacted cerumen.  Eyes:     Extraocular Movements: Extraocular movements intact.     Pupils: Pupils are equal, round, and reactive to light.  Cardiovascular:     Rate and Rhythm: Normal rate and regular rhythm.     Pulses: Normal pulses.     Heart sounds: Normal heart sounds.  Pulmonary:     Effort: Pulmonary effort is normal. No respiratory distress.     Breath sounds: Normal breath sounds. No wheezing.  Musculoskeletal:     Cervical back: Normal range of motion and neck supple.  Skin:    General: Skin is warm and dry.  Neurological:     Mental Status: She is alert and oriented to person, place, and time.     Cranial Nerves: No facial asymmetry.     Motor: No weakness.     Coordination: Coordination normal.  Psychiatric:        Mood and Affect: Mood normal.        Speech: Speech normal.        Behavior: Behavior normal.         Assessment And Plan:     1. Acute non-recurrent maxillary sinusitis -Will treat for sinusitis.  -Instructed patient to stay well hydrated.  -Use OTC saline spray  -OTC decongestants and antihistamines as needed.  -- amoxicillin-clavulanate (AUGMENTIN) 875-125 MG tablet; Take 1 tablet by mouth 2 (two) times  daily for 7 days.  Dispense: 14 tablet; Refill: 0  2. Acute non-recurrent frontal sinusitis -Instructed patient to stay well hydrated  -Use OTC saline spray  -Use OTC decongestants and antihistamines as needed.  - amoxicillin-clavulanate (AUGMENTIN) 875-125 MG tablet; Take 1 tablet by mouth 2 (two) times daily for 7 days.  Dispense: 14 tablet; Refill: 0  3. Sinus pressure -Use OTC  -Will treat for sinusitis.  -Instructed patient to stay well hydrated.   4. Acute nonintractable headache, unspecified headache type - POC COVID-19-  Neg in office  - Novel Coronavirus, NAA ([facility]) -Instructed patient to take OTC pain relievers for HA -Instructed patient to stay well hydrated -educated patient if HA doesn't resolve or get any worse to call us or seek urgent/emergent care.    Patient was given opportunity to ask questions. Patient verbalized understanding of the plan and was able to repeat key elements of the plan. All questions were answered to their satisfaction.  Bary Castilla, NP   I, Bary Castilla, NP, have reviewed all documentation for this visit. The documentation on 06/23/20 for the exam, diagnosis, procedures, and orders are all accurate and complete.  THE PATIENT IS ENCOURAGED TO PRACTICE SOCIAL DISTANCING DUE TO THE COVID-19 PANDEMIC.

## 2020-06-23 NOTE — Patient Instructions (Signed)
Tension Headache, Adult A tension headache is a feeling of pain, pressure, or aching over the front and sides of the head. The pain can be dull, or it can feel tight. There are two types of tension headache:  Episodic tension headache. This is when the headaches happen fewer than 15 days a month.  Chronic tension headache. This is when the headaches happen more than 15 days a month during a 3-month period. A tension headache can last from 30 minutes to several days. It is the most common kind of headache. Tension headaches are not normally associated with nausea or vomiting, and they do not get worse with physical activity. What are the causes? The exact cause of this condition is not known. Tension headaches are often triggered by stress, anxiety, or depression. Other triggers may include:  Alcohol.  Too much caffeine or caffeine withdrawal.  Respiratory infections, such as colds, flu, or sinus infections.  Dental problems or teeth clenching.  Fatigue.  Holding your head and neck in the same position for a long period of time, such as while using a computer.  Smoking.  Arthritis of the neck. What are the signs or symptoms? Symptoms of this condition include:  A feeling of pressure or tightness around the head.  Dull, aching head pain.  Pain over the front and sides of the head.  Tenderness in the muscles of the head, neck, and shoulders. How is this diagnosed? This condition may be diagnosed based on your symptoms, your medical history, and a physical exam. If your symptoms are severe or unusual, you may have imaging tests, such as a CT scan or an MRI of your head. Your vision may also be checked. How is this treated? This condition may be treated with lifestyle changes and with medicines that help relieve symptoms. Follow these instructions at home: Managing pain  Take over-the-counter and prescription medicines only as told by your health care provider.  When you have  a headache, lie down in a dark, quiet room.  If directed, put ice on your head and neck. To do this: ? Put ice in a plastic bag. ? Place a towel between your skin and the bag. ? Leave the ice on for 20 minutes, 2-3 times a day. ? Remove the ice if your skin turns bright red. This is very important. If you cannot feel pain, heat, or cold, you have a greater risk of damage to the area.  If directed, apply heat to the back of your neck as often as told by your health care provider. Use the heat source that your health care provider recommends, such as a moist heat pack or a heating pad. ? Place a towel between your skin and the heat source. ? Leave the heat on for 20-30 minutes. ? Remove the heat if your skin turns bright red. This is especially important if you are unable to feel pain, heat, or cold. You have a greater risk of getting burned. Eating and drinking  Eat meals on a regular schedule.  If you drink alcohol: ? Limit how much you have to:  0-1 drink a day for women who are not pregnant.  0-2 drinks a day for men. ? Know how much alcohol is in your drink. In the [place] one drink equals one 12 oz bottle of beer (355 mL), one 5 oz glass of wine (148 mL), or one 1 oz glass of hard liquor (44 mL).  Drink enough fluid to keep your urine   pale yellow.  Decrease your caffeine intake, or stop using caffeine. Lifestyle  Get 7-9 hours of sleep each night, or get the amount of sleep recommended by your health care provider.  At bedtime, remove computers, phones, and tablets from your room.  Find ways to manage your stress. This may include: ? Exercise. ? Deep breathing exercises. ? Yoga. ? Listening to music. ? Positive mental imagery.  Try to sit up straight and avoid tensing your muscles.  Do not use any products that contain nicotine or tobacco. These include cigarettes, chewing tobacco, and vaping devices, such as e-cigarettes. If you need help quitting, ask your health care  provider. General instructions  Avoid any headache triggers. Keep a journal to help find out what may trigger your headaches. For example, write down: ? What you eat and drink. ? How much sleep you get. ? Any change to your diet or medicines.  Keep all follow-up visits. This is important.   Contact a health care provider if:  Your headache does not get better.  Your headache comes back.  You are sensitive to sounds, light, or smells because of a headache.  You have nausea or you vomit.  Your stomach hurts. Get help right away if:  You suddenly develop a severe headache, along with any of the following: ? A stiff neck. ? Nausea and vomiting. ? Confusion. ? Weakness in one part or one side of your body. ? Double vision or loss of vision. ? Shortness of breath. ? Rash. ? Unusual sleepiness. ? Fever or chills. ? Trouble speaking. ? Pain in your eye or ear. ? Trouble walking or balancing. ? Feeling faint or passing out. Summary  A tension headache is a feeling of pain, pressure, or aching over the front and sides of the head.  A tension headache can last from 30 minutes to several days. It is the most common kind of headache.  This condition may be diagnosed based on your symptoms, your medical history, and a physical exam.  This condition may be treated with lifestyle changes and with medicines that help relieve symptoms. This information is not intended to replace advice given to you by your health care provider. Make sure you discuss any questions you have with your health care provider. Document Revised: 02/12/2020 Document Reviewed: 02/12/2020 Elsevier Patient Education  2021 [employer] Inc.  

## 2020-06-24 LAB — SARS-COV-2, NAA 2 DAY TAT

## 2020-06-24 LAB — NOVEL CORONAVIRUS, NAA: SARS-CoV-2, NAA: NOT DETECTED

## 2020-07-01 ENCOUNTER — Other Ambulatory Visit: Payer: Self-pay

## 2020-07-01 MED ORDER — CARBINOXAMINE MALEATE 6 MG PO TABS: ORAL_TABLET | ORAL | 0 refills | Status: DC

## 2020-07-01 MED ORDER — FLUTICASONE PROPIONATE 50 MCG/ACT NA SUSP
2.0000 | Freq: Every day | NASAL | 1 refills | Status: DC
Start: 2020-07-01 — End: 2020-08-02

## 2020-07-15 ENCOUNTER — Other Ambulatory Visit: Payer: Self-pay

## 2020-07-15 MED ORDER — CARBINOXAMINE MALEATE 6 MG PO TABS: ORAL_TABLET | ORAL | 0 refills | Status: DC

## 2020-07-19 ENCOUNTER — Telehealth: Payer: Self-pay

## 2020-07-19 NOTE — Telephone Encounter (Signed)
I left the pt a message that her insurance no longer covers her Ryvent medication and that the pt can try a Good Rx card to see what the cost would be.

## 2020-07-21 ENCOUNTER — Ambulatory Visit: Payer: Federal, State, Local not specified - PPO | Admitting: Nurse Practitioner

## 2020-07-21 ENCOUNTER — Other Ambulatory Visit: Payer: Self-pay

## 2020-07-21 ENCOUNTER — Encounter: Payer: Self-pay | Admitting: Nurse Practitioner

## 2020-07-21 ENCOUNTER — Ambulatory Visit
Admission: RE | Admit: 2020-07-21 | Discharge: 2020-07-21 | Disposition: A | Discharge status: Home / Self Care | Payer: Federal, State, Local not specified - PPO | Source: Ambulatory Visit | Attending: Nurse Practitioner | Admitting: Nurse Practitioner

## 2020-07-21 VITALS — BP 122/80 | HR 84 | Temp 98.2°F | Ht 64.2 in | Wt 159.8 lb

## 2020-07-21 DIAGNOSIS — R519 Headache, unspecified: Secondary | ICD-10-CM

## 2020-07-21 DIAGNOSIS — H9202 Otalgia, left ear: Secondary | ICD-10-CM

## 2020-07-21 MED ORDER — AMOXICILLIN-POT CLAVULANATE 875-125 MG PO TABS: 1.0000 | ORAL_TABLET | Freq: Two times a day (BID) | ORAL | 0 refills | Status: AC

## 2020-07-21 NOTE — Progress Notes (Signed)
I,Yamilka Roman [employer] as a Education administrator for [employer], FNP.,have documented all relevant documentation on the behalf of Minette Brine, FNP,as directed by  Minette Brine, FNP while in the presence of Minette Brine, [place]. This visit occurred during the SARS-CoV-2 public health emergency.  Safety protocols were in place, including screening questions prior to the visit, additional usage of staff PPE, and extensive cleaning of exam room while observing appropriate contact time as indicated for disinfecting solutions.  Subjective:     Patient ID: Sylvia Kelly , female    DOB: [birth year] , 65 y.o.   MRN: [id number]   Chief Complaint  Patient presents with  . Headache    Patient stated she has been having a headache on the left back side of her head that is worse in the morning. She also reports having stuffiness in her left ear.     HPI  Patient presents today for a headache and ear discomfort that started about a month ago. She had taken ryvent which was effective. But her insurance did not cover. The headache is worse when she lays down but not as bad when sitting up.  She has a small amount of nasal drainage.   Headache  This is a new problem. The current episode started more than 1 month ago (one month ago). Associated symptoms include ear pain. Pertinent negatives include no dizziness. Treatments tried: ryvent, antibiotic  There is no history of cancer.     Past Medical History:  Diagnosis Date  . Atrophic vaginitis   . Diabetes mellitus   . Elevated cholesterol   . Hypertension      Family History  Problem Relation Age of Onset  . Heart disease Mother   . Diabetes Mother   . Cancer Father        Lung cancer     Current Outpatient Medications:  .  amoxicillin-clavulanate (AUGMENTIN) 875-125 MG tablet, Take 1 tablet by mouth 2 (two) times daily for 10 days., Disp: 20 tablet, Rfl: 0 .  aspirin 81 MG tablet, Take 81 mg by mouth daily., Disp: , Rfl:  .  atorvastatin  (LIPITOR) 80 MG tablet, Take 1 tablet (80 mg total) by mouth daily., Disp: 90 tablet, Rfl: 1 .  Blood Glucose Monitoring Suppl (ONETOUCH VERIO REFLECT) w/Device KIT, Use as directed to check blood sugars 3 times per day dx: e11.65, Disp: 1 kit, Rfl: 1 .  fluticasone (FLONASE) 50 MCG/ACT nasal spray, Place 2 sprays into both nostrils daily., Disp: 16 g, Rfl: 1 .  ibuprofen (ADVIL,MOTRIN) 800 MG tablet, Take 1 tablet (800 mg total) by mouth 3 (three) times daily., Disp: 21 tablet, Rfl: 0 .  insulin degludec (TRESIBA FLEXTOUCH) 200 UNIT/ML FlexTouch Pen, Inject 56 Units into the skin at bedtime., Disp: 27 mL, Rfl: 2 .  lisinopril-hydrochlorothiazide (ZESTORETIC) 20-25 MG tablet, TAKE 1/2 TABLET BY MOUTH EVERY DAY, Disp: 45 tablet, Rfl: 2 .  Multiple Vitamin (MULTIVITAMIN) tablet, Take 1 tablet by mouth daily., Disp: , Rfl:  .  OneTouch Delica Lancets 44W MISC, Use as directed to check blood sugars 3 times per day dx: e11.65, Disp: 150 each, Rfl: 3 .  ONETOUCH VERIO test strip, Use as directed to check blood sugars 3 times per day dx: e11.65, Disp: 150 each, Rfl: 3 .  Semaglutide, 1 MG/DOSE, (OZEMPIC, 1 MG/DOSE,) 4 MG/3ML SOPN, Inject 1 mg into the skin once a week., Disp: 9 mL, Rfl: 1 .  Carbinoxamine Maleate (RYVENT) 6 MG TABS, Take one tablet by  mouth every 8 hours as needed (Patient not taking: Reported on 07/21/2020), Disp: 30 tablet, Rfl: 0 .  Insulin Pen Needle (B-D UF III MINI PEN NEEDLES) 31G X 5 MM MISC, USE AS DIRECTED WITH LEVEMIR, Disp: 150 each, Rfl: 3   No Known Allergies   Review of Systems  Constitutional: Negative.   HENT: Positive for ear pain.   Eyes: Negative.   Respiratory: Negative.   Cardiovascular: Negative.   Gastrointestinal: Negative.   Endocrine: Negative.   Genitourinary: Negative.   Musculoskeletal: Negative.   Skin: Negative.   Neurological: Positive for headaches. Negative for dizziness.  Hematological: Negative.   Psychiatric/Behavioral: Negative.       Today's Vitals   07/21/20 1424  BP: 122/80  Pulse: 84  Temp: 98.2 F (36.8 C)  TempSrc: Oral  Weight: 159 lb 12.8 oz (72.5 kg)  Height: 5' 4.2" (1.631 m)  PainSc: 0-No pain   Body mass index is 27.26 kg/m.   Objective:  Physical Exam Constitutional:      General: She is not in acute distress.    Appearance: She is well-developed and normal weight.  HENT:     Right Ear: Tympanic membrane is not bulging.     Left Ear: Tympanic membrane is bulging.  Cardiovascular:     Rate and Rhythm: Normal rate and regular rhythm.     Heart sounds: Normal heart sounds.  Pulmonary:     Effort: Pulmonary effort is normal.     Breath sounds: Normal breath sounds.  Abdominal:     General: Bowel sounds are normal.     Palpations: Abdomen is soft.  Musculoskeletal:        General: Normal range of motion.     Cervical back: Normal range of motion and neck supple.  Skin:    General: Skin is warm.     Capillary Refill: Capillary refill takes less than 2 seconds.  Neurological:     Mental Status: She is alert and oriented to person, place, and time.  Psychiatric:        Mood and Affect: Mood normal.        Speech: Speech normal.        Behavior: Behavior normal.         Assessment And Plan:     1. Acute nonintractable headache, unspecified headache type  Will check sinus xray  She is encouraged to take over the counter antihistamine - DG Sinuses Complete; Future  2. Left ear pain  She does have TM bulging noted to left ear  Antihistamine and nasal spray encouraged   Patient was given opportunity to ask questions. Patient verbalized understanding of the plan and was able to repeat key elements of the plan. All questions were answered to their satisfaction.  Minette Brine, FNP   I, Minette Brine, FNP, have reviewed all documentation for this visit. The documentation on 07/24/20 for the exam, diagnosis, procedures, and orders are all accurate and complete.  THE PATIENT IS  ENCOURAGED TO PRACTICE SOCIAL DISTANCING DUE TO THE COVID-19 PANDEMIC.

## 2020-07-22 ENCOUNTER — Other Ambulatory Visit: Payer: Self-pay | Admitting: Internal Medicine

## 2020-07-22 ENCOUNTER — Encounter: Payer: Self-pay | Admitting: Internal Medicine

## 2020-07-23 ENCOUNTER — Other Ambulatory Visit: Payer: Self-pay

## 2020-07-23 MED ORDER — BD PEN NEEDLE MINI U/F 31G X 5 MM MISC: 3 refills | Status: DC

## 2020-08-01 ENCOUNTER — Other Ambulatory Visit: Payer: Self-pay | Admitting: Nurse Practitioner

## 2020-08-06 ENCOUNTER — Other Ambulatory Visit: Payer: Self-pay | Admitting: Nurse Practitioner

## 2020-08-09 ENCOUNTER — Other Ambulatory Visit: Payer: Self-pay

## 2020-08-09 MED ORDER — CARBINOXAMINE MALEATE 6 MG PO TABS: ORAL_TABLET | ORAL | 0 refills | Status: DC

## 2020-08-10 ENCOUNTER — Other Ambulatory Visit: Payer: Self-pay

## 2020-08-10 MED ORDER — CARBINOXAMINE MALEATE 6 MG PO TABS: ORAL_TABLET | ORAL | 0 refills | Status: DC

## 2020-08-15 ENCOUNTER — Other Ambulatory Visit: Payer: Self-pay | Admitting: Internal Medicine

## 2020-09-06 ENCOUNTER — Encounter: Payer: Self-pay | Admitting: Internal Medicine

## 2020-09-06 ENCOUNTER — Other Ambulatory Visit: Payer: Self-pay

## 2020-09-06 ENCOUNTER — Ambulatory Visit: Payer: Federal, State, Local not specified - PPO | Admitting: Internal Medicine

## 2020-09-06 VITALS — BP 126/84 | HR 81 | Temp 97.7°F | Ht 64.2 in | Wt 160.2 lb

## 2020-09-06 DIAGNOSIS — E042 Nontoxic multinodular goiter: Secondary | ICD-10-CM

## 2020-09-06 DIAGNOSIS — I10 Essential (primary) hypertension: Secondary | ICD-10-CM

## 2020-09-06 DIAGNOSIS — Z794 Long term (current) use of insulin: Secondary | ICD-10-CM | POA: Diagnosis not present

## 2020-09-06 DIAGNOSIS — Z1211 Encounter for screening for malignant neoplasm of colon: Secondary | ICD-10-CM

## 2020-09-06 DIAGNOSIS — R7989 Other specified abnormal findings of blood chemistry: Secondary | ICD-10-CM | POA: Diagnosis not present

## 2020-09-06 DIAGNOSIS — E1140 Type 2 diabetes mellitus with diabetic neuropathy, unspecified: Secondary | ICD-10-CM

## 2020-09-06 MED ORDER — ONETOUCH DELICA LANCETS 33G MISC: 3 refills | Status: DC

## 2020-09-06 NOTE — Patient Instructions (Signed)
Diabetes Mellitus and Exercise Exercising regularly is important for overall health, especially for people who have diabetes mellitus. Exercising is not only about losing weight. It has many other health benefits, such as increasing muscle strength and bone density and reducing body fat and stress. This leads to improved fitness, flexibility, and endurance, all of which result in better overall health. What are the benefits of exercise if I have diabetes? Exercise has many benefits for people with diabetes. They include:  Helping to lower and control blood sugar (glucose).  Helping the body to respond better to the hormone insulin by improving insulin sensitivity.  Reducing how much insulin the body needs.  Lowering the risk for heart disease by: ? Lowering "bad" cholesterol and triglyceride levels. ? Increasing "good" cholesterol levels. ? Lowering blood pressure. ? Lowering blood glucose levels. What is my activity plan? Your health care provider or certified diabetes educator can help you make a plan for the type and frequency of exercise that works for you. This is called your activity plan. Be sure to:  Get at least 150 minutes of medium-intensity or high-intensity exercise each week. Exercises may include brisk walking, biking, or water aerobics.  Do stretching and strengthening exercises, such as yoga or weight lifting, at least 2 times a week.  Spread out your activity over at least 3 days of the week.  Get some form of physical activity each day. ? Do not go more than 2 days in a row without some kind of physical activity. ? Avoid being inactive for more than 90 minutes at a time. Take frequent breaks to walk or stretch.  Choose exercises or activities that you enjoy. Set realistic goals.  Start slowly and gradually increase your exercise intensity over time.   How do I manage my diabetes during exercise? Monitor your blood glucose  Check your blood glucose before and  after exercising. If your blood glucose is: ? 240 mg/dL (13.3 mmol/L) or higher before you exercise, check your urine for ketones. These are chemicals created by the liver. If you have ketones in your urine, do not exercise until your blood glucose returns to normal. ? 100 mg/dL (5.6 mmol/L) or lower, eat a snack containing 15-20 grams of carbohydrate. Check your blood glucose 15 minutes after the snack to make sure that your glucose level is above 100 mg/dL (5.6 mmol/L) before you start your exercise.  Know the symptoms of low blood glucose (hypoglycemia) and how to treat it. Your risk for hypoglycemia increases during and after exercise. Follow these tips and your health care provider's instructions  Keep a carbohydrate snack that is fast-acting for use before, during, and after exercise to help prevent or treat hypoglycemia.  Avoid injecting insulin into areas of the body that are going to be exercised. For example, avoid injecting insulin into: ? Your arms, when you are about to play tennis. ? Your legs, when you are about to go jogging.  Keep records of your exercise habits. Doing this can help you and your health care provider adjust your diabetes management plan as needed. Write down: ? Food that you eat before and after you exercise. ? Blood glucose levels before and after you exercise. ? The type and amount of exercise you have done.  Work with your health care provider when you start a new exercise or activity. He or she may need to: ? Make sure that the activity is safe for you. ? Adjust your insulin, other medicines, and food that   you eat.  Drink plenty of water while you exercise. This prevents loss of water (dehydration) and problems caused by a lot of heat in the body (heat stroke).   Where to find more information  American Diabetes Association: www.diabetes.org Summary  Exercising regularly is important for overall health, especially for people who have diabetes  mellitus.  Exercising has many health benefits. It increases muscle strength and bone density and reduces body fat and stress. It also lowers and controls blood glucose.  Your health care provider or certified diabetes educator can help you make an activity plan for the type and frequency of exercise that works for you.  Work with your health care provider to make sure any new activity is safe for you. Also work with your health care provider to adjust your insulin, other medicines, and the food you eat. This information is not intended to replace advice given to you by your health care provider. Make sure you discuss any questions you have with your health care provider. Document Revised: 02/10/2019 Document Reviewed: 02/10/2019 Elsevier Patient Education  2021 Elsevier Inc.  

## 2020-09-06 NOTE — Progress Notes (Signed)
I,Katawbba Wiggins,acting as a Education administrator for [name], MD.,have documented all relevant documentation on the behalf of [name], MD,as directed by  [name], MD while in the presence of [name], MD.  This visit occurred during the SARS-CoV-2 public health emergency.  Safety protocols were in place, including screening questions prior to the visit, additional usage of staff PPE, and extensive cleaning of exam room while observing appropriate contact time as indicated for disinfecting solutions.  Subjective:     Patient ID: Sylvia Kelly , female    DOB: 04-Jan-1956 , 65 y.o.   MRN: [id number]   Chief Complaint  Patient presents with  . Diabetes    HPI  She presents today for DM/htn follow-up.  She reports compliance with meds. She denies headaches, chest pain and shortness of breath.   Diabetes She presents for her follow-up diabetic visit. She has type 2 diabetes mellitus. Her disease course has been improving. There are no hypoglycemic associated symptoms. Pertinent negatives for diabetes include no blurred vision, no chest pain and no fatigue. There are no hypoglycemic complications. Diabetic complications include peripheral neuropathy. Risk factors for coronary artery disease include diabetes mellitus, dyslipidemia, sedentary lifestyle, hypertension and post-menopausal.  Hypertension This is a chronic problem. The current episode started more than 1 year ago. The problem has been gradually improving since onset. Pertinent negatives include no blurred vision, chest pain, palpitations or shortness of breath.     Past Medical History:  Diagnosis Date  . Atrophic vaginitis   . Diabetes mellitus   . Elevated cholesterol   . Hypertension      Family History  Problem Relation Age of Onset  . Heart disease Mother   . Diabetes Mother   . Cancer Father        Lung cancer     Current Outpatient Medications:  .  aspirin 81 MG tablet, Take 81 mg by mouth  daily., Disp: , Rfl:  .  atorvastatin (LIPITOR) 80 MG tablet, Take 1 tablet (80 mg total) by mouth daily., Disp: 90 tablet, Rfl: 1 .  fluticasone (FLONASE) 50 MCG/ACT nasal spray, SPRAY 2 SPRAYS INTO EACH NOSTRIL EVERY DAY, Disp: 48 mL, Rfl: 1 .  ibuprofen (ADVIL,MOTRIN) 800 MG tablet, Take 1 tablet (800 mg total) by mouth 3 (three) times daily., Disp: 21 tablet, Rfl: 0 .  insulin degludec (TRESIBA FLEXTOUCH) 200 UNIT/ML FlexTouch Pen, Inject 56 Units into the skin at bedtime., Disp: 27 mL, Rfl: 2 .  Insulin Pen Needle (B-D UF III MINI PEN NEEDLES) 31G X 5 MM MISC, USE AS DIRECTED WITH LEVEMIR, Disp: 150 each, Rfl: 3 .  lisinopril-hydrochlorothiazide (ZESTORETIC) 20-25 MG tablet, TAKE 1/2 TABLET BY MOUTH EVERY DAY, Disp: 45 tablet, Rfl: 2 .  Multiple Vitamin (MULTIVITAMIN) tablet, Take 1 tablet by mouth daily., Disp: , Rfl:  .  ONETOUCH VERIO test strip, Use as directed to check blood sugars 3 times per day dx: e11.65, Disp: 150 each, Rfl: 3 .  Semaglutide, 1 MG/DOSE, (OZEMPIC, 1 MG/DOSE,) 4 MG/3ML SOPN, Inject 1 mg into the skin once a week., Disp: 9 mL, Rfl: 1 .  Blood Glucose Monitoring Suppl (ONETOUCH VERIO REFLECT) w/Device KIT, Use as directed to check blood sugars 3 times per day dx: e11.65, Disp: 1 kit, Rfl: 1 .  Carbinoxamine Maleate (RYVENT) 6 MG TABS, Take one tablet by mouth every 8 hours as needed (Patient not taking: Reported on 09/06/2020), Disp: 30 tablet, Rfl: 0 .  OneTouch Delica Lancets  33G MISC, Use as directed to check blood sugars 3 times per day dx: e11.65, Disp: 150 each, Rfl: 3   No Known Allergies   Review of Systems  Constitutional: Negative.  Negative for fatigue.  Eyes: Negative for blurred vision.  Respiratory: Negative.  Negative for shortness of breath.   Cardiovascular: Negative.  Negative for chest pain and palpitations.  Gastrointestinal: Negative.   Psychiatric/Behavioral: Negative.   All other systems reviewed and are negative.    Today's Vitals    09/06/20 1120  BP: 126/84  Pulse: 81  Temp: 97.7 F (36.5 C)  TempSrc: Oral  Weight: 160 lb 3.2 oz (72.7 kg)  Height: 5' 4.2" (1.631 m)   Body mass index is 27.33 kg/m.  Wt Readings from Last 3 Encounters:  09/06/20 160 lb 3.2 oz (72.7 kg)  07/21/20 159 lb 12.8 oz (72.5 kg)  06/23/20 160 lb 12.8 oz (72.9 kg)   Objective:  Physical Exam Vitals and nursing note reviewed.  Constitutional:      Appearance: Normal appearance.  HENT:     Head: Normocephalic and atraumatic.     Nose:     Comments: Masked     Mouth/Throat:     Comments: Masked  Neck:     Thyroid: Thyromegaly present.  Cardiovascular:     Rate and Rhythm: Normal rate and regular rhythm.     Heart sounds: Normal heart sounds.  Pulmonary:     Effort: Pulmonary effort is normal.     Breath sounds: Normal breath sounds.  Musculoskeletal:     Cervical back: Normal range of motion.  Skin:    General: Skin is warm.  Neurological:     General: No focal deficit present.     Mental Status: She is alert.  Psychiatric:        Mood and Affect: Mood normal.        Behavior: Behavior normal.         Assessment And Plan:     1. Type 2 diabetes mellitus with diabetic neuropathy, with long-term current use of insulin (HCC) Comments: Chronic, I will check labs as listed below. I will adjust meds as needed.  Medication/dietary compliance was stressed to the patient. She is encouraged to schedule diabetic eye exam as well. She will f/u in 3-4 months for re-evaluation.  - BMP8+EGFR - Hemoglobin A1c  2. Essential (primary) hypertension Comments: Chronc, well controlled. She will c/w current meds. Encouraged to follow a low sodium diet.   3. Multinodular goiter Comments: She is due for f/u thyroid u/s in June, advised to get annually for next five years, starting in 2022. - US THYROID; Future  4. Elevated liver function tests Comments: I will check repeat LFTs. Admits to past use of OTC meds for headaches. She has not  been using these meds recently.  - Liver Profile  5. Screen for colon cancer Comments: She agrees to GI referral for CRC screening.  - Ambulatory referral to Gastroenterology     Patient was given opportunity to ask questions. Patient verbalized understanding of the plan and was able to repeat key elements of the plan. All questions were answered to their satisfaction.    I, [name], MD, have reviewed all documentation for this visit. The documentation on 09/06/20 for the exam, diagnosis, procedures, and orders are all accurate and complete.   IF YOU HAVE BEEN REFERRED TO A SPECIALIST, IT MAY TAKE 1-2 WEEKS TO SCHEDULE/PROCESS THE REFERRAL. IF YOU HAVE NOT HEARD FROM  US/SPECIALIST IN TWO WEEKS, PLEASE GIVE [place] A CALL AT [phone] X [phone].   THE PATIENT IS ENCOURAGED TO PRACTICE SOCIAL DISTANCING DUE TO THE COVID-19 PANDEMIC.

## 2020-09-07 ENCOUNTER — Encounter: Payer: Self-pay | Admitting: Internal Medicine

## 2020-09-07 LAB — HEPATIC FUNCTION PANEL
ALT: 20 IU/L (ref 0–32)
AST: 20 IU/L (ref 0–40)
Albumin: 4.2 g/dL (ref 3.8–4.8)
Alkaline Phosphatase: 104 IU/L (ref 44–121)
Bilirubin Total: 0.4 mg/dL (ref 0.0–1.2)
Bilirubin, Direct: 0.11 mg/dL (ref 0.00–0.40)
Total Protein: 6.7 g/dL (ref 6.0–8.5)

## 2020-09-07 LAB — BMP8+EGFR
BUN/Creatinine Ratio: 16 (ref 12–28)
BUN: 16 mg/dL (ref 8–27)
CO2: 26 mmol/L (ref 20–29)
Calcium: 9.3 mg/dL (ref 8.7–10.3)
Chloride: 98 mmol/L (ref 96–106)
Creatinine, Ser: 0.99 mg/dL (ref 0.57–1.00)
Glucose: 130 mg/dL — ABNORMAL HIGH (ref 65–99)
Potassium: 4.4 mmol/L (ref 3.5–5.2)
Sodium: 141 mmol/L (ref 134–144)
eGFR: 64 mL/min/{1.73_m2} (ref >59)

## 2020-09-07 LAB — HEMOGLOBIN A1C
Est. average glucose Bld gHb Est-mCnc: 203 mg/dL
Hgb A1c MFr Bld: 8.7 % — ABNORMAL HIGH (ref 4.8–5.6)

## 2020-09-08 ENCOUNTER — Other Ambulatory Visit: Payer: Self-pay

## 2020-09-08 MED ORDER — DAPAGLIFLOZIN PROPANEDIOL 10 MG PO TABS
10.0000 mg | ORAL_TABLET | Freq: Every day | ORAL | 1 refills | Status: DC
Start: 2020-09-08 — End: 2020-09-21

## 2020-09-09 ENCOUNTER — Telehealth: Payer: Self-pay

## 2020-09-09 NOTE — Telephone Encounter (Signed)
The pt was notified to pickup samples of jardiance 25 Mg because her insurance doesn't cover the [place].

## 2020-09-15 ENCOUNTER — Other Ambulatory Visit: Payer: Self-pay

## 2020-09-15 NOTE — Telephone Encounter (Signed)
Prior auth done for farxiga 10 mg waiting on a response from the [employer].

## 2020-09-21 ENCOUNTER — Other Ambulatory Visit: Payer: Self-pay | Admitting: Internal Medicine

## 2020-09-22 ENCOUNTER — Other Ambulatory Visit: Payer: Federal, State, Local not specified - PPO

## 2020-09-22 ENCOUNTER — Other Ambulatory Visit: Payer: Self-pay | Admitting: Internal Medicine

## 2020-09-29 ENCOUNTER — Ambulatory Visit
Admission: RE | Admit: 2020-09-29 | Discharge: 2020-09-29 | Disposition: A | Discharge status: Home / Self Care | Payer: Federal, State, Local not specified - PPO | Source: Ambulatory Visit | Attending: Internal Medicine | Admitting: Internal Medicine

## 2020-09-29 DIAGNOSIS — E042 Nontoxic multinodular goiter: Secondary | ICD-10-CM | POA: Diagnosis not present

## 2020-10-09 DIAGNOSIS — E119 Type 2 diabetes mellitus without complications: Secondary | ICD-10-CM | POA: Diagnosis not present

## 2020-10-09 LAB — HM DIABETES EYE EXAM

## 2020-10-17 ENCOUNTER — Encounter: Payer: Self-pay | Admitting: Internal Medicine

## 2020-10-20 ENCOUNTER — Encounter: Payer: Self-pay | Admitting: Radiology

## 2020-11-03 ENCOUNTER — Encounter: Payer: Self-pay | Admitting: Internal Medicine

## 2020-11-10 ENCOUNTER — Other Ambulatory Visit: Payer: Self-pay

## 2020-11-10 ENCOUNTER — Encounter: Payer: Self-pay | Admitting: Internal Medicine

## 2020-11-10 ENCOUNTER — Encounter: Payer: Self-pay | Admitting: Gastroenterology

## 2020-11-10 ENCOUNTER — Ambulatory Visit (INDEPENDENT_AMBULATORY_CARE_PROVIDER_SITE_OTHER): Payer: Federal, State, Local not specified - PPO | Admitting: Internal Medicine

## 2020-11-10 VITALS — BP 122/68 | HR 81 | Temp 97.8°F | Ht 63.6 in | Wt 156.2 lb

## 2020-11-10 DIAGNOSIS — Z794 Long term (current) use of insulin: Secondary | ICD-10-CM | POA: Diagnosis not present

## 2020-11-10 DIAGNOSIS — Z23 Encounter for immunization: Secondary | ICD-10-CM

## 2020-11-10 DIAGNOSIS — Z0000 Encounter for general adult medical examination without abnormal findings: Secondary | ICD-10-CM | POA: Diagnosis not present

## 2020-11-10 DIAGNOSIS — R202 Paresthesia of skin: Secondary | ICD-10-CM | POA: Diagnosis not present

## 2020-11-10 DIAGNOSIS — E1140 Type 2 diabetes mellitus with diabetic neuropathy, unspecified: Secondary | ICD-10-CM | POA: Diagnosis not present

## 2020-11-10 DIAGNOSIS — I10 Essential (primary) hypertension: Secondary | ICD-10-CM

## 2020-11-10 LAB — POCT URINALYSIS DIPSTICK
Bilirubin, UA: NEGATIVE
Blood, UA: NEGATIVE
Glucose, UA: POSITIVE — AB
Ketones, UA: NEGATIVE
Nitrite, UA: NEGATIVE
Protein, UA: NEGATIVE
Spec Grav, UA: 1.01 (ref 1.010–1.025)
Urobilinogen, UA: 0.2 E.U./dL
pH, UA: 5.5 (ref 5.0–8.0)

## 2020-11-10 LAB — POCT UA - MICROALBUMIN
Albumin/Creatinine Ratio, Urine, POC: <30
Creatinine, POC: 200 mg/dL
Microalbumin Ur, POC: 30 mg/L

## 2020-11-10 MED ORDER — SHINGRIX 50 MCG/0.5ML IM SUSR: 0.5000 mL | Freq: Once | INTRAMUSCULAR | 0 refills | Status: AC

## 2020-11-10 NOTE — Progress Notes (Signed)
I,Katawbba Wiggins,acting as a Education administrator for [name], MD.,have documented all relevant documentation on the behalf of [name], MD,as directed by  [name], MD while in the presence of [name], MD.  This visit occurred during the SARS-CoV-2 public health emergency.  Safety protocols were in place, including screening questions prior to the visit, additional usage of staff PPE, and extensive cleaning of exam room while observing appropriate contact time as indicated for disinfecting solutions.  Subjective:     Patient ID: Sylvia Kelly , female    DOB: [birth year] , 65 y.o.   MRN: [id number]   Chief Complaint  Patient presents with   Annual Exam   Diabetes   Hypertension     HPI  She is here today for a full physical examination. She is followed by Dr. Harolyn Rutherford for her GYN exams. She reports compliance with meds. She has no specific concerns or complaints at this time.   Diabetes She presents for her follow-up diabetic visit. She has type 2 diabetes mellitus. Her disease course has been fluctuating. There are no hypoglycemic associated symptoms. Pertinent negatives for diabetes include no blurred vision and no chest pain. There are no hypoglycemic complications. Diabetic complications include peripheral neuropathy. Risk factors for coronary artery disease include dyslipidemia, diabetes mellitus, hypertension, post-menopausal and sedentary lifestyle. Her breakfast blood glucose is taken between 9-10 am. Her breakfast blood glucose range is generally 140-180 mg/dl.  Hypertension This is a chronic problem. The current episode started more than 1 year ago. The problem has been gradually improving since onset. The problem is controlled. Pertinent negatives include no blurred vision, chest pain, palpitations or shortness of breath.    Past Medical History:  Diagnosis Date   Atrophic vaginitis    Diabetes mellitus    Elevated cholesterol    Hypertension       Family History  Problem Relation Age of Onset   Heart disease Mother    Diabetes Mother    Cancer Father        Lung cancer     Current Outpatient Medications:    aspirin 81 MG tablet, Take 81 mg by mouth daily., Disp: , Rfl:    atorvastatin (LIPITOR) 80 MG tablet, Take 1 tablet (80 mg total) by mouth daily., Disp: 90 tablet, Rfl: 1   Blood Glucose Monitoring Suppl (ONETOUCH VERIO REFLECT) w/Device KIT, Use as directed to check blood sugars 3 times per day dx: e11.65, Disp: 1 kit, Rfl: 1   dapagliflozin propanediol (FARXIGA) 10 MG TABS tablet, Take by mouth daily., Disp: , Rfl:    fluticasone (FLONASE) 50 MCG/ACT nasal spray, SPRAY 2 SPRAYS INTO EACH NOSTRIL EVERY DAY, Disp: 48 mL, Rfl: 1   insulin degludec (TRESIBA FLEXTOUCH) 200 UNIT/ML FlexTouch Pen, Inject 56 Units into the skin at bedtime. (Patient taking differently: Inject 52 Units into the skin at bedtime.), Disp: 27 mL, Rfl: 2   Insulin Pen Needle (B-D UF III MINI PEN NEEDLES) 31G X 5 MM MISC, USE AS DIRECTED WITH LEVEMIR, Disp: 150 each, Rfl: 3   lisinopril-hydrochlorothiazide (ZESTORETIC) 20-25 MG tablet, TAKE 1/2 TABLET BY MOUTH EVERY DAY, Disp: 45 tablet, Rfl: 2   Multiple Vitamin (MULTIVITAMIN) tablet, Take 1 tablet by mouth daily., Disp: , Rfl:    OneTouch Delica Lancets 38L MISC, Use as directed to check blood sugars 3 times per day dx: e11.65, Disp: 150 each, Rfl: 3   ONETOUCH VERIO test strip, USE AS DIRECTED TO CHECK BLOOD SUGARS 3  TIMES PER DAY DX: E11.65, Disp: 200 strip, Rfl: 3   Semaglutide, 1 MG/DOSE, (OZEMPIC, 1 MG/DOSE,) 4 MG/3ML SOPN, Inject 1 mg into the skin once a week., Disp: 9 mL, Rfl: 1   Carbinoxamine Maleate (RYVENT) 6 MG TABS, Take one tablet by mouth every 8 hours as needed (Patient not taking: No sig reported), Disp: 30 tablet, Rfl: 0   ibuprofen (ADVIL,MOTRIN) 800 MG tablet, Take 1 tablet (800 mg total) by mouth 3 (three) times daily., Disp: 21 tablet, Rfl: 0   No Known Allergies    The patient  states she uses post menopausal status for birth control. Last LMP was No LMP recorded. Patient is postmenopausal.. Negative for Dysmenorrhea. Negative for: breast discharge, breast lump(s), breast pain and breast self exam. Associated symptoms include abnormal vaginal bleeding. Pertinent negatives include abnormal bleeding (hematology), anxiety, decreased libido, depression, difficulty falling sleep, dyspareunia, history of infertility, nocturia, sexual dysfunction, sleep disturbances, urinary incontinence, urinary urgency, vaginal discharge and vaginal itching. Diet regular.The patient states her exercise level is  intermittent.   . The patient's tobacco use is:  Social History   Tobacco Use  Smoking Status Never  Smokeless Tobacco Never  . She has been exposed to passive smoke. The patient's alcohol use is:  Social History   Substance and Sexual Activity  Alcohol Use No    Review of Systems  Constitutional: Negative.   HENT: Negative.    Eyes: Negative.  Negative for blurred vision.  Respiratory: Negative.  Negative for shortness of breath.   Cardiovascular: Negative.  Negative for chest pain and palpitations.  Gastrointestinal: Negative.   Endocrine: Negative.   Genitourinary: Negative.   Musculoskeletal: Negative.        Right knee pain  Skin: Negative.   Allergic/Immunologic: Negative.   Neurological:  Positive for numbness (running down arms to fingers).  Hematological: Negative.   Psychiatric/Behavioral: Negative.      Today's Vitals   11/10/20 0903  BP: 122/68  Pulse: 81  Temp: 97.8 F (36.6 C)  TempSrc: Oral  Weight: 156 lb 3.2 oz (70.9 kg)  Height: 5' 3.6" (1.615 m)   Body mass index is 27.15 kg/m.  Wt Readings from Last 3 Encounters:  11/10/20 156 lb 3.2 oz (70.9 kg)  09/06/20 160 lb 3.2 oz (72.7 kg)  07/21/20 159 lb 12.8 oz (72.5 kg)    BP Readings from Last 3 Encounters:  11/10/20 122/68  09/06/20 126/84  07/21/20 122/80    Objective:  Physical  Exam Vitals and nursing note reviewed.  Constitutional:      Appearance: Normal appearance.  HENT:     Head: Normocephalic and atraumatic.     Right Ear: Tympanic membrane, ear canal and external ear normal.     Left Ear: Tympanic membrane, ear canal and external ear normal.     Nose:     Comments: Masked     Mouth/Throat:     Comments: Masked  Eyes:     Extraocular Movements: Extraocular movements intact.     Conjunctiva/sclera: Conjunctivae normal.     Pupils: Pupils are equal, round, and reactive to light.  Neck:     Thyroid: Thyromegaly present.  Cardiovascular:     Rate and Rhythm: Normal rate and regular rhythm.     Pulses: Normal pulses.          Dorsalis pedis pulses are 2+ on the right side and 2+ on the left side.     Heart sounds: Normal heart sounds.  Pulmonary:  Effort: Pulmonary effort is normal.     Breath sounds: Normal breath sounds.  Chest:  Breasts:    Tanner Score is 5.     Right: Normal.     Left: Normal.  Abdominal:     General: Abdomen is flat. Bowel sounds are normal.     Palpations: Abdomen is soft.  Genitourinary:    Comments: deferred Musculoskeletal:        General: Normal range of motion.     Cervical back: Normal range of motion and neck supple.  Feet:     Right foot:     Protective Sensation: 5 sites tested.  5 sites sensed.     Skin integrity: Dry skin present.     Toenail Condition: Right toenails are normal.     Left foot:     Protective Sensation: 5 sites tested.  5 sites sensed.     Skin integrity: Dry skin present.     Toenail Condition: Left toenails are normal.  Skin:    General: Skin is warm and dry.  Neurological:     General: No focal deficit present.     Mental Status: She is alert and oriented to person, place, and time.  Psychiatric:        Mood and Affect: Mood normal.        Behavior: Behavior normal.        Assessment And Plan:     1. Routine general medical examination at health care facility Comments: A  full exam was performed. Importance of monthly self breast exams was d/w pt. Encouraged to call [facility] to schedule colonoscopy.  PATIENT IS ADVISED TO GET 30-45 MINUTES REGULAR EXERCISE NO LESS THAN FOUR TO FIVE DAYS PER WEEK - BOTH WEIGHTBEARING EXERCISES AND AEROBIC ARE RECOMMENDED.  PATIENT IS ADVISED TO FOLLOW A HEALTHY DIET WITH AT LEAST SIX FRUITS/VEGGIES PER DAY, DECREASE INTAKE OF RED MEAT, AND TO INCREASE FISH INTAKE TO TWO DAYS PER WEEK.  MEATS/FISH SHOULD NOT BE FRIED, BAKED OR BROILED IS PREFERABLE.  IT IS ALSO IMPORTANT TO CUT BACK ON YOUR SUGAR INTAKE. PLEASE AVOID ANYTHING WITH ADDED SUGAR, CORN SYRUP OR OTHER SWEETENERS. IF YOU MUST USE A SWEETENER, YOU CAN TRY STEVIA. IT IS ALSO IMPORTANT TO AVOID ARTIFICIALLY SWEETENERS AND DIET BEVERAGES. LASTLY, I SUGGEST WEARING SPF 50 SUNSCREEN ON EXPOSED PARTS AND ESPECIALLY WHEN IN THE DIRECT SUNLIGHT FOR AN EXTENDED PERIOD OF TIME.  PLEASE AVOID FAST FOOD RESTAURANTS AND INCREASE YOUR WATER INTAKE.   2. Type 2 diabetes mellitus with diabetic neuropathy, with long-term current use of insulin ([place]) Comments: Diabetic foot exam was performed. I DISCUSSED WITH THE PATIENT AT LENGTH REGARDING THE GOALS OF GLYCEMIC CONTROL AND POSSIBLE LONG-TERM COMPLICATIONS.  I  ALSO STRESSED THE IMPORTANCE OF COMPLIANCE WITH HOME GLUCOSE MONITORING, DIETARY RESTRICTIONS INCLUDING AVOIDANCE OF SUGARY DRINKS/PROCESSED FOODS,  ALONG WITH REGULAR EXERCISE.  I  ALSO STRESSED THE IMPORTANCE OF ANNUAL EYE EXAMS, SELF FOOT CARE AND COMPLIANCE WITH OFFICE VISITS.  - POCT Urinalysis Dipstick (81002) - POCT UA - Microalbumin  3. Essential (primary) hypertension Comments: Chronic, controlled. EKG performed, NSR w/o acute changes. She is encouraged to follow low sodium diet. She will f/u in 4-6 months for re-evaluation.  - EKG 12-Lead  4. Paresthesia Comments: Not suggestive of diabetic neuropathy since it goes toward her fingers instead of starting at tips and radiating  upwards. I will check vitamin B12 level.  If persistent, will consider EMG/NCS testing.   5. Immunization due Comments: I  will send rx Shingrix to her local pharmacy.   Patient was given opportunity to ask questions. Patient verbalized understanding of the plan and was able to repeat key elements of the plan. All questions were answered to their satisfaction.   I, [name], MD, have reviewed all documentation for this visit. The documentation on 11/13/20 for the exam, diagnosis, procedures, and orders are all accurate and complete.  THE PATIENT IS ENCOURAGED TO PRACTICE SOCIAL DISTANCING DUE TO THE COVID-19 PANDEMIC.

## 2020-11-12 DIAGNOSIS — M79609 Pain in unspecified limb: Secondary | ICD-10-CM | POA: Insufficient documentation

## 2020-11-12 DIAGNOSIS — E042 Nontoxic multinodular goiter: Secondary | ICD-10-CM | POA: Insufficient documentation

## 2020-11-12 DIAGNOSIS — R202 Paresthesia of skin: Secondary | ICD-10-CM | POA: Insufficient documentation

## 2020-11-22 ENCOUNTER — Other Ambulatory Visit: Payer: Self-pay | Admitting: Nurse Practitioner

## 2020-11-30 ENCOUNTER — Encounter: Payer: Self-pay | Admitting: Internal Medicine

## 2020-12-03 ENCOUNTER — Encounter: Payer: Self-pay | Admitting: Internal Medicine

## 2020-12-07 ENCOUNTER — Telehealth: Payer: Self-pay

## 2020-12-07 ENCOUNTER — Telehealth: Payer: Self-pay | Admitting: *Deleted

## 2020-12-07 NOTE — Telephone Encounter (Signed)
Spoke with patient to let them know that we will need income documentation, SS documentation and insurance information for patient assistance paper work. Patient is currently on Farxiga 10 mg tablet once per day and Tresiba 56 units once per day and Ozempic 1 mg once a week.   Patient will come tomorrow and bring financial documentation with her.   Total Time: 35 minutes  [name], PharmD Clinical Pharmacist [facility] [phone]

## 2020-12-07 NOTE — Chronic Care Management (AMB) (Signed)
  Chronic Care Management   Outreach Note  12/07/2020 Name: Sylvia Kelly MRN: [id number] DOB: 1956/03/10  Sylvia Kelly is a 65 y.o. year old female who is a primary care patient of Glendale Chard, MD. I reached out to Sylvia Kelly by phone today in response to a referral sent by Ms. Dorice Lamas Amoroso's PCP, Dr. Baird Cancer       An unsuccessful telephone outreach was attempted today. The patient was referred to the case management team for assistance with care management and care coordination.   Follow Up Plan: A HIPAA compliant phone message was left for the patient providing contact information and requesting a return call. The care management team will reach out to the patient again over the next 7 days.  If patient returns call to provider office, please advise to call [place] at [phone].  [place] [facility] Management

## 2020-12-08 ENCOUNTER — Telehealth: Payer: Self-pay

## 2020-12-08 NOTE — Chronic Care Management (AMB) (Signed)
  Chronic Care Management   Note  12/08/2020 Name: Sylvia Kelly MRN: [id number] DOB: March 15, 1956  Sylvia Kelly is a 65 y.o. year old female who is a primary care patient of Glendale Chard, MD. I reached out to Lestine Box by phone today in response to a referral sent by Sylvia Kelly's PCP, Dr. Baird Cancer.      Sylvia Kelly was given information about Chronic Care Management services today including:  CCM service includes personalized support from designated clinical staff supervised by her physician, including individualized plan of care and coordination with other care providers 24/7 contact phone numbers for assistance for urgent and routine care needs. Service will only be billed when office clinical staff spend 20 minutes or more in a month to coordinate care. Only one practitioner may furnish and bill the service in a calendar month. The patient may stop CCM services at any time (effective at the end of the month) by phone call to the office staff. The patient will be responsible for cost sharing (co-pay) of up to 20% of the service fee (after annual deductible is met).  Patient agreed to services and verbal consent obtained.   Follow up plan: Telephone appointment with care management team member scheduled for:01/13/2021  Sharline Lehane  Care Guide, Embedded Care Coordination [place]  Care Management

## 2020-12-08 NOTE — Chronic Care Management (AMB) (Signed)
    Chronic Care Management Pharmacy Assistant   Name: Sylvia Kelly  MRN: [id number] DOB: [birth year]  Reason for Encounter: Patient Assistance Coordination   12/08/2020- Patient Assistance application filled our for [name] with [name] patient assistance program. Printing form, awaiting provider signature, patient signature. Patient brought income, once signatures received will fax to PAP program.   Medications: Outpatient Encounter Medications as of 12/08/2020  Medication Sig   aspirin 81 MG tablet Take 81 mg by mouth daily.   atorvastatin (LIPITOR) 80 MG tablet Take 1 tablet (80 mg total) by mouth daily.   Blood Glucose Monitoring Suppl (ONETOUCH VERIO REFLECT) w/Device KIT Use as directed to check blood sugars 3 times per day dx: e11.65   Carbinoxamine Maleate (RYVENT) 6 MG TABS Take one tablet by mouth every 8 hours as needed (Patient not taking: No sig reported)   dapagliflozin propanediol (FARXIGA) 10 MG TABS tablet Take by mouth daily.   fluticasone (FLONASE) 50 MCG/ACT nasal spray SPRAY 2 SPRAYS INTO EACH NOSTRIL EVERY DAY   ibuprofen (ADVIL,MOTRIN) 800 MG tablet Take 1 tablet (800 mg total) by mouth 3 (three) times daily.   insulin degludec (TRESIBA FLEXTOUCH) 200 UNIT/ML FlexTouch Pen Inject 56 Units into the skin at bedtime. (Patient taking differently: Inject 52 Units into the skin at bedtime.)   Insulin Pen Needle (B-D UF III MINI PEN NEEDLES) 31G X 5 MM MISC USE AS DIRECTED WITH LEVEMIR   lisinopril-hydrochlorothiazide (ZESTORETIC) 20-25 MG tablet TAKE 1/2 TABLET BY MOUTH EVERY DAY   Multiple Vitamin (MULTIVITAMIN) tablet Take 1 tablet by mouth daily.   OneTouch Delica Lancets 16L MISC Use as directed to check blood sugars 3 times per day dx: e11.65   ONETOUCH VERIO test strip USE AS DIRECTED TO CHECK BLOOD SUGARS 3 TIMES PER DAY DX: E11.65   OZEMPIC, 1 MG/DOSE, 4 MG/3ML SOPN INJECT 1 MG INTO THE SKIN ONCE A WEEK.   No facility-administered encounter medications on  file as of 12/08/2020.    Care Gaps: COLONOSCOPY (Pts 45-48yrs Insurance coverage will need to be confirmed) (Every 10 Years)-Last completed: May 29, 2010    COVID-19 Vaccine (4 - Booster for [employer] series) OPHTHALMOLOGY EXAM Ammie Dalton completed: Aug 08, 2019 PAP SMEAR-Modifier (Yearly)- Last completed: Dec 15, 2019 [place] Vaccines- Shingrix Annual Wellness Visit- 06/13/2021   Star Rating Drugs: Atorvastatin 80 mg- Last filled 12/02/2020 [facility] day [facility] at [facility]. Farxiga 10 mg- Last filled 09/29/2020 for 90 day supply at [facility]. Ozempic- Last filled 11/23/2020 for [facility] day supply at [facility]. Lisinopril-HCTZ 20/25 mg- Last filled 12/02/2020 [facility] day [facility].     SIG: [name], [place] Pharmacist Assistant [phone]

## 2020-12-19 ENCOUNTER — Encounter: Payer: Self-pay | Admitting: Internal Medicine

## 2020-12-30 ENCOUNTER — Encounter: Payer: Self-pay | Admitting: Internal Medicine

## 2021-01-05 ENCOUNTER — Telehealth: Payer: Self-pay

## 2021-01-05 NOTE — Chronic Care Management (AMB) (Signed)
    Chronic Care Management Pharmacy Assistant   Name: Sylvia Kelly  MRN: [id number] DOB: 04-Sep-1955   Reason for Encounter: Chart review for CPP visit on 01-13-2021   Conditions to be addressed/monitored: HTN and DMII   Recent office visits:  11-10-2020 Glendale Chard, MD. STOP Jardiance 25 mg. Shingrix given. Positive glucose UA and small leukocytes.  09-06-2020 Glendale Chard, MD. Glucose= 130. A1C= 8.7  07-21-2020 Minette Brine, FNP. START Augmentin 875-125 mg two tablets daily for 10 days.  Recent consult visits:  None  Hospital visits:  None in previous 6 months  Medications: Outpatient Encounter Medications as of 01/05/2021  Medication Sig   aspirin 81 MG tablet Take 81 mg by mouth daily.   atorvastatin (LIPITOR) 80 MG tablet Take 1 tablet (80 mg total) by mouth daily.   Blood Glucose Monitoring Suppl (ONETOUCH VERIO REFLECT) w/Device KIT Use as directed to check blood sugars 3 times per day dx: e11.65   Carbinoxamine Maleate (RYVENT) 6 MG TABS Take one tablet by mouth every 8 hours as needed (Patient not taking: No sig reported)   dapagliflozin propanediol (FARXIGA) 10 MG TABS tablet Take by mouth daily.   fluticasone (FLONASE) 50 MCG/ACT nasal spray SPRAY 2 SPRAYS INTO EACH NOSTRIL EVERY DAY   ibuprofen (ADVIL,MOTRIN) 800 MG tablet Take 1 tablet (800 mg total) by mouth 3 (three) times daily.   insulin degludec (TRESIBA FLEXTOUCH) 200 UNIT/ML FlexTouch Pen Inject 56 Units into the skin at bedtime. (Patient taking differently: Inject 52 Units into the skin at bedtime.)   Insulin Pen Needle (B-D UF III MINI PEN NEEDLES) 31G X 5 MM MISC USE AS DIRECTED WITH LEVEMIR   lisinopril-hydrochlorothiazide (ZESTORETIC) 20-25 MG tablet TAKE 1/2 TABLET BY MOUTH EVERY DAY   Multiple Vitamin (MULTIVITAMIN) tablet Take 1 tablet by mouth daily.   OneTouch Delica Lancets 54T MISC Use as directed to check blood sugars 3 times per day dx: e11.65   ONETOUCH VERIO test strip USE AS  DIRECTED TO CHECK BLOOD SUGARS 3 TIMES PER DAY DX: E11.65   OZEMPIC, 1 MG/DOSE, 4 MG/3ML SOPN INJECT 1 MG INTO THE SKIN ONCE A WEEK.   No facility-administered encounter medications on file as of 01/05/2021.     01-05-2021: 1st attempt left VM. Called [employer] and [employer] and was told patient wasn't in the system. Informed [name]. 01-07-2021: 2nd attempt left VM. 01-11-2021: 3rd attempt left VM  Care Gaps: Annual Wellness Visit- 06/13/2021 COLONOSCOPY (Pts 45-36yrs Insurance coverage will need to be confirmed) (Every 10 Years)-Last completed: May 29, 2010       COVID-19 Vaccine (4 - Booster for [employer] series) OPHTHALMOLOGY EXAM Ammie Dalton completed: Aug 08, 2019 PAP SMEAR-Modifier (Yearly)- Last completed: Dec 15, 2019  Star Rating Drugs: Atorvastatin 80 mg- Last filled [facility]/2020 [facility]. [name] 10 mg- Last filled 09/29/2020 [facility] (Patient assistance in process) Ozempic- Last filled 11/23/2020 [facility] day [facility] (Patient assistance in process) Lisinopril-HCTZ 20/25 mg- Last filled 12/02/2020 for 90 day supply at [facility] Pharmacist Assistant [phone]

## 2021-01-13 ENCOUNTER — Ambulatory Visit (INDEPENDENT_AMBULATORY_CARE_PROVIDER_SITE_OTHER): Payer: Federal, State, Local not specified - PPO

## 2021-01-13 DIAGNOSIS — I10 Essential (primary) hypertension: Secondary | ICD-10-CM

## 2021-01-13 DIAGNOSIS — Z794 Long term (current) use of insulin: Secondary | ICD-10-CM

## 2021-01-13 DIAGNOSIS — E7800 Pure hypercholesterolemia, unspecified: Secondary | ICD-10-CM

## 2021-01-13 DIAGNOSIS — E1140 Type 2 diabetes mellitus with diabetic neuropathy, unspecified: Secondary | ICD-10-CM

## 2021-01-13 NOTE — Progress Notes (Addendum)
Chronic Care Management Pharmacy Note  01/25/2021 Name:  ADALEI NOVELL MRN:  [id number] DOB:  [birth year]  Summary: Patient reports that she is worried about the cost of her medications.   Recommendations/Changes made from today's visit: Recommend patient check her BP at least three times per week.  Patient filled out paperwork for patient assistance program for Farxiga and   Plan: Patient is going to check her BP and record the readings in a log book.    Subjective: DUNIA PRINGLE is an 65 y.o. year old female who is a primary patient of Glendale Chard, MD.  The CCM team was consulted for assistance with disease management and care coordination needs.  She retired at age 30 in 2013. 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 In between that time she did other things that were not in the office. 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 This work was very fulfilling and then the last person she worked with passed away. She sometimes helps people taking [facility] due to traumatic brain [facility]. She lives in [place]. 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 She loves [place] food. She also takes care of health. She is on the [employer] at church, she goes to [place]. She is apart of a prayer line from 3-4 from Monday through Friday. She has a season pass to [employer] and enjoyed the [place] pool. Her grandson is there with her this summer and she is enjoying the time.   Engaged with patient by telephone for initial visit in response to provider referral for pharmacy case management and/or care coordination services.   Consent to Services:  The patient was given the following information about Chronic Care Management services today, agreed to services, and gave verbal consent: 1. CCM service  includes personalized support from designated clinical staff supervised by the primary care provider, including individualized plan of care and coordination with other care providers 2. 24/7 contact phone numbers for assistance for urgent and routine care needs. 3. Service will only be billed when office clinical staff spend 20 minutes or more in a month to coordinate care. 4. Only one practitioner may furnish and bill the service in a calendar month. 5.The patient may stop CCM services at any time (effective at the end of the month) by phone call to the office staff. 6. The patient will be responsible for cost sharing (co-pay) of up to 20% of the service fee (after annual deductible is met). Patient agreed to services and consent obtained.  Patient Care Team: Glendale Chard, MD as PCP - General (Internal Medicine) Mayford Knife, [facility] (Pharmacist)  Recent office visits:  11-10-2020 Glendale Chard, MD. STOP Jardiance 25 mg. Shingrix given. Positive glucose UA and small leukocytes.   09-06-2020 Glendale Chard, MD. Glucose= 130. A1C= 8.7   07-21-2020 Minette Brine, FNP. START Augmentin 875-125 mg two tablets daily for 10 days.   Recent consult visits:  None   Hospital visits:  None in previous 6 months   Objective:  Lab Results  Component Value Date   CREATININE 1.03 (H) 01/19/2021   BUN 18 01/19/2021   GFRNONAA 75 06/09/2020   GFRAA 86 06/09/2020   NA 138 01/19/2021   K 4.7 01/19/2021   CALCIUM 9.4 01/19/2021  CO2 22 01/19/2021   GLUCOSE 187 (H) 01/19/2021    Lab Results  Component Value Date/Time   HGBA1C 6.7 (H) 01/19/2021 12:22 PM   HGBA1C 8.7 (H) 09/06/2020 11:53 AM   HGBA1C 7.7 01/16/2018 12:00 AM   MICROALBUR 30 11/10/2020 12:48 PM   MICROALBUR 30 11/04/2019 11:00 AM    Last diabetic Eye exam:  Lab Results  Component Value Date/Time   HMDIABEYEEXA Retinopathy (A) 08/08/2019 12:00 AM    Last diabetic Foot exam: No results found for: HMDIABFOOTEX   Lab Results   Component Value Date   CHOL 178 06/09/2020   HDL 61 06/09/2020   LDLCALC 94 06/09/2020   TRIG 129 06/09/2020   CHOLHDL 2.9 06/09/2020    Hepatic Function Latest Ref Rng & Units 09/06/2020 06/09/2020 11/04/2019  Total Protein 6.0 - 8.5 g/dL 6.7 6.6 6.5  Albumin 3.8 - 4.8 g/dL 4.2 4.0 4.1  AST 0 - 40 IU/L 20 31 23   ALT 0 - 32 IU/L 20 50(H) 26  Alk Phosphatase 44 - 121 IU/L 104 127(H) 93  Total Bilirubin 0.0 - 1.2 mg/dL 0.4 0.5 0.4  Bilirubin, Direct 0.00 - 0.40 mg/dL 0.11 - -    Lab Results  Component Value Date/Time   TSH 0.60 10/10/2017 12:00 AM    CBC Latest Ref Rng & Units 11/04/2019 10/30/2018  WBC 3.4 - 10.8 x10E3/uL 3.6 4.0  Hemoglobin 11.1 - 15.9 g/dL 12.1 12.3  Hematocrit 34.0 - 46.6 % 36.8 36.5  Platelets 150 - 450 x10E3/uL 285 288    No results found for: VD25OH  Clinical ASCVD: No  The 10-year ASCVD risk score Mikey Bussing DC Jr., et al., 2013) is: 14.6%   Values used to calculate the score:     Age: 65 years     Sex: Female     Is Non-Hispanic African American: Yes     Diabetic: Yes     Tobacco smoker: No     Systolic Blood Pressure: 212 mmHg     Is BP treated: Yes     HDL Cholesterol: 61 mg/dL     Total Cholesterol: 178 mg/dL    Depression screen [facility] 2/9 11/10/2020 11/04/2019 02/06/2019  Decreased Interest 0 0 0  Down, Depressed, Hopeless 0 0 0  PHQ - 2 Score 0 0 0     Social History   Tobacco Use  Smoking Status Never  Smokeless Tobacco Never   BP Readings from Last 3 Encounters:  01/19/21 116/70  11/10/20 122/68  09/06/20 126/84   Pulse Readings from Last 3 Encounters:  01/19/21 74  11/10/20 81  09/06/20 81   Wt Readings from Last 3 Encounters:  01/24/21 152 lb (68.9 kg)  01/19/21 152 lb 9.6 oz (69.2 kg)  11/10/20 156 lb 3.2 oz (70.9 kg)   BMI Readings from Last 3 Encounters:  01/24/21 26.50 kg/m  01/19/21 25.87 kg/m  11/10/20 27.15 kg/m    Assessment/Interventions: Review of patient past medical history, allergies, medications, health  status, including review of consultants reports, laboratory and other test data, was performed as part of comprehensive evaluation and provision of chronic care management services.   SDOH:  (Social Determinants of Health) assessments and interventions performed: Yes  SDOH Screenings   Alcohol Screen: Not on file  Depression (PHQ2-9): Low Risk    PHQ-2 Score: 0  Financial Resource Strain: Not on file  Food Insecurity: Not on file  Housing: Not on file  Physical Activity: Not on file  Social Connections: Not on file  Stress: Not on file  Tobacco Use: Low Risk    Smoking Tobacco Use: Never   Smokeless Tobacco Use: Never  Transportation Needs: Not on file    CCM Care Plan  No Known Allergies  Medications Reviewed Today     Reviewed by Steva Ready, RN (Registered Nurse) on 01/24/21 at 67  Med List Status: <None>   Medication Order Taking? Sig Documenting Provider Last Dose Status Informant  aspirin 81 MG tablet [id number] Yes Take 81 mg by mouth daily. [provider] Taking Active   atorvastatin (LIPITOR) 80 MG tablet [id number] Yes Take 1 tablet (80 mg total) by mouth daily. Bary Castilla, NP Taking Active   Blood Glucose Monitoring Suppl (ONETOUCH VERIO REFLECT) w/Device Drucie Opitz [id number] Yes Use as directed to check blood sugars 3 times per day dx: e11.65 Glendale Chard, MD Taking Active   dapagliflozin propanediol (FARXIGA) 10 MG TABS tablet [id number] Yes Take by mouth daily. [provider] Taking Active   fluticasone (FLONASE) 50 MCG/ACT nasal spray [id number] Yes SPRAY 2 SPRAYS INTO EACH NOSTRIL EVERY DAY Minette Brine, FNP Taking Active   ibuprofen (ADVIL,MOTRIN) 800 MG tablet [id number] Yes Take 1 tablet (800 mg total) by mouth 3 (three) times daily. Domenic Moras, PA-C Taking Active   insulin degludec (TRESIBA FLEXTOUCH) 200 UNIT/ML FlexTouch Pen [id number] Yes Inject 56 Units into the skin at bedtime.  Patient taking differently: Inject 48 Units into  the skin at bedtime.   Bary Castilla, NP Taking Active   Insulin Pen Needle (B-D UF III MINI PEN NEEDLES) 31G X 5 MM MISC [id number] Yes USE AS DIRECTED WITH Leodis Liverpool, MD Taking Active   lisinopril-hydrochlorothiazide (ZESTORETIC) 20-25 MG tablet [id number] Yes TAKE 1/2 TABLET BY MOUTH EVERY DAY Ghumman, Ramandeep, NP Taking Active   Multiple Vitamin (MULTIVITAMIN) tablet [id number] Yes Take 1 tablet by mouth daily. [provider] Taking Active   OneTouch Delica Lancets 62H MISC [id number] Yes Use as directed to check blood sugars 3 times per day dx: e11.65 Glendale Chard, MD Taking Active   [facility] VERIO test strip [id number] Yes USE AS DIRECTED TO CHECK BLOOD SUGARS 3 TIMES PER DAY DX: E11.65 Glendale Chard, MD Taking Active   OZEMPIC, 1 MG/DOSE, 4 MG/3ML SOPN [id number] Yes INJECT 1 MG INTO THE SKIN ONCE A WEEK. Bary Castilla, NP Taking Active   Sodium Sulfate-Mag Sulfate-KCl (SUTAB) [phone] MG TABS [id number] Yes Take 24 tablets by mouth as directed. 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 Kara Dies: [id number] PCN: CN GROUP: [id number] MEMBER ID: [id number];LKT AS SECONDARY INSURANCE ;NO PRIOR AUTHORIZATION Mauri Pole, MD  Active             Patient Active Problem List   Diagnosis Date Noted   Multinodular goiter 11/12/2020   Paresthesia 11/12/2020   Female climacteric state 01/04/2017   Other long term (current) drug therapy 01/04/2017   Essential (primary) hypertension    Type 2 diabetes mellitus with diabetic neuropathy, unspecified (HCC)    Elevated cholesterol     Immunization History  Administered Date(s) Administered   DTaP 09/09/2013   Influenza Inj Mdck Quad Pf 03/20/2019   Influenza,inj,Quad PF,6+ Mos 04/04/2012, 02/26/2017, 03/21/2018, 03/20/2019, 04/09/2020   Influenza-Unspecified 03/21/2018   Moderna Sars-Covid-2 Vaccination 07/08/2019, 08/05/2019, 03/26/2020, 01/10/2021   Pneumococcal Conjugate-13 05/21/2018   Pneumococcal Polysaccharide-23 05/08/2019    Tdap 05/29/2012    Conditions to be addressed/monitored:  Hypertension, Hyperlipidemia, and Diabetes  Care Plan : [place]  Updates made by Mayford Knife, [place] since 01/25/2021 12:00  AM     Problem: HTN, HLD, DM II      Long-Range Goal: Disease Management   This Visit's Progress: On track  Note:    Current Barriers:  Unable to independently afford treatment regimen Unable to achieve control of Diabetes   Pharmacist Clinical Goal(s):  Patient will verbalize ability to afford treatment regimen achieve adherence to monitoring guidelines and medication adherence to achieve therapeutic efficacy through collaboration with PharmD and provider.   Interventions: 1:1 collaboration with Glendale Chard, MD regarding development and update of comprehensive plan of care as evidenced by provider attestation and co-signature Inter-disciplinary care team collaboration (see longitudinal plan of care) Comprehensive medication review performed; medication list updated in electronic medical record  Hypertension (BP goal <130/80) -Controlled -Current treatment: Lisinopril - Hydrochlorothiazide 20- 25 mg taking 1 tablet by mouth daily  -Current home readings: she does not currently check her BP at home, because it is good in the office -Current dietary habits: she is limiting salt in her diet  -Current exercise habits: please see diabetes for more information  -Denies hypotensive/hypertensive symptoms -Educated on Daily salt intake goal < 2300 mg; -Counseled to monitor BP at home at least three times per week, document, and provide log at future appointments -Recommended to continue current medication  Hyperlipidemia: (LDL goal < 70) -Not ideally controlled -Current treatment: Atorvastatin 80 mg tablet once per day  -Medications previously tried: Zetia,   -Current dietary patterns: pork chops, broiled chicken,  -Current exercise habits: please see diabetes  -Educated on  Cholesterol goals;  Benefits of statin for ASCVD risk reduction; -Recommended to continue current medication  Diabetes (A1c goal <7%) -Controlled -Current medications: Ozempic 1 mg/dose - inject 1 mg once a week Farxiga 10 mg - taking 1 tablet by mouth daily  Tresiba inject 52 units at bedtime  -Medications previously tried: Levemir, Janumet 50-1000 mg, Humalog,  -Current home glucose readings fasting glucose: 69-78 -Denies hypoglycemic/hyperglycemic symptoms -Current meal patterns:  breakfast: no longer has appetite lunch: subway sandwich, with vegetables and oil& vinegar  dinner: she goes to a farm and get fresh vegetables and a protein  snacks: sometimes a few potato chips, or tostititos with cheese or salsa as a snack but not everyday.  drinks: cup of coffee  -Current exercise: she is walking in her neighborhood for 30-40 minutes, she reports that she needs to join the silver sneakers program.  -Educated on A1c and blood sugar goals; Complications of diabetes including kidney damage, retinal damage, and cardiovascular disease; Exercise goal of 150 minutes per week; -Counseled to check feet daily and get yearly eye exams -Recommended to continue current medication Collaborated with PCP to change patients current Tresiba dose by 3 units due to low readings   Patient Goals/Self-Care Activities Patient will:  - take medications as prescribed collaborate with provider on medication access solutions  Follow Up Plan: The patient has been provided with contact information for the care management team and has been advised to call with any health related questions or concerns.       Medication Assistance: Application for Ozempic, Farxiga  medication assistance program. in process.  Anticipated assistance start date 01/2021.  See plan of care for additional detail.  Compliance/Adherence/Medication fill history: Care Gaps: Shingrix Vaccine Colonoscopy COVID -19 Vaccine Opthamology  Exam Pap Smear DEXA Scan Influenza Vaccine  Star-Rating Drugs: Farxiga 10 mg tablet  Lisinopril - Hydrochlorothiazide 10-25 mg  Ozempic 1 mg Atorvastatin 80 mg  Patient's preferred pharmacy is:  [id number]  [place], [place] [address] [place] [address] Phone: [phone] Fax: [phone]  [place], [place] [address] [address] Phone: [phone] Fax: [phone]  Uses pill box? Yes Pt endorses 90% compliance  We discussed: Benefits of medication synchronization, packaging and delivery as well as enhanced pharmacist oversight with [employer]. Patient decided to: Continue current medication management strategy  Care Plan and Follow Up Patient Decision:  Patient agrees to Care Plan and Follow-up.  Plan: The patient has been provided with contact information for the care management team and has been advised to call with any health related questions or concerns.   [name], PharmD Clinical Pharmacist [facility] [phone]

## 2021-01-16 ENCOUNTER — Encounter: Payer: Self-pay | Admitting: Internal Medicine

## 2021-01-19 ENCOUNTER — Ambulatory Visit (INDEPENDENT_AMBULATORY_CARE_PROVIDER_SITE_OTHER): Payer: Medicare Other | Admitting: Internal Medicine

## 2021-01-19 ENCOUNTER — Other Ambulatory Visit: Payer: Self-pay

## 2021-01-19 ENCOUNTER — Encounter: Payer: Self-pay | Admitting: Internal Medicine

## 2021-01-19 VITALS — BP 116/70 | HR 74 | Temp 98.2°F | Ht 64.4 in | Wt 152.6 lb

## 2021-01-19 DIAGNOSIS — I10 Essential (primary) hypertension: Secondary | ICD-10-CM

## 2021-01-19 DIAGNOSIS — Z794 Long term (current) use of insulin: Secondary | ICD-10-CM | POA: Diagnosis not present

## 2021-01-19 DIAGNOSIS — E1140 Type 2 diabetes mellitus with diabetic neuropathy, unspecified: Secondary | ICD-10-CM

## 2021-01-19 DIAGNOSIS — Z79899 Other long term (current) drug therapy: Secondary | ICD-10-CM

## 2021-01-19 NOTE — Patient Instructions (Signed)
Diabetes Mellitus and Nutrition, Adult When you have diabetes, or diabetes mellitus, it is very important to have healthy eating habits because your blood sugar (glucose) levels are greatly affected by what you eat and drink. Eating healthy foods in the right amounts, at about the same times every day, can help you:  Control your blood glucose.  Lower your risk of heart disease.  Improve your blood pressure.  Reach or maintain a healthy weight. What can affect my meal plan? Every person with diabetes is different, and each person has different needs for a meal plan. Your health care provider may recommend that you work with a dietitian to make a meal plan that is best for you. Your meal plan may vary depending on factors such as:  The calories you need.  The medicines you take.  Your weight.  Your blood glucose, blood pressure, and cholesterol levels.  Your activity level.  Other health conditions you have, such as heart or kidney disease. How do carbohydrates affect me? Carbohydrates, also called carbs, affect your blood glucose level more than any other type of food. Eating carbs naturally raises the amount of glucose in your blood. Carb counting is a method for keeping track of how many carbs you eat. Counting carbs is important to keep your blood glucose at a healthy level, especially if you use insulin or take certain oral diabetes medicines. It is important to know how many carbs you can safely have in each meal. This is different for every person. Your dietitian can help you calculate how many carbs you should have at each meal and for each snack. How does alcohol affect me? Alcohol can cause a sudden decrease in blood glucose (hypoglycemia), especially if you use insulin or take certain oral diabetes medicines. Hypoglycemia can be a life-threatening condition. Symptoms of hypoglycemia, such as sleepiness, dizziness, and confusion, are similar to symptoms of having too much  alcohol.  Do not drink alcohol if: ? Your health care provider tells you not to drink. ? You are pregnant, may be pregnant, or are planning to become pregnant.  If you drink alcohol: ? Do not drink on an empty stomach. ? Limit how much you use to:  0-1 drink a day for women.  0-2 drinks a day for men. ? Be aware of how much alcohol is in your drink. In the [place] one drink equals one 12 oz bottle of beer (355 mL), one 5 oz glass of wine (148 mL), or one 1 oz glass of hard liquor (44 mL). ? Keep yourself hydrated with water, diet soda, or unsweetened iced tea.  Keep in mind that regular soda, juice, and other mixers may contain a lot of sugar and must be counted as carbs. What are tips for following this plan? Reading food labels  Start by checking the serving size on the "Nutrition Facts" label of packaged foods and drinks. The amount of calories, carbs, fats, and other nutrients listed on the label is based on one serving of the item. Many items contain more than one serving per package.  Check the total grams (g) of carbs in one serving. You can calculate the number of servings of carbs in one serving by dividing the total carbs by 15. For example, if a food has 30 g of total carbs per serving, it would be equal to 2 servings of carbs.  Check the number of grams (g) of saturated fats and trans fats in one serving. Choose foods that have   a low amount or none of these fats.  Check the number of milligrams (mg) of salt (sodium) in one serving. Most people should limit total sodium intake to less than 2,300 mg per day.  Always check the nutrition information of foods labeled as "low-fat" or "nonfat." These foods may be higher in added sugar or refined carbs and should be avoided.  Talk to your dietitian to identify your daily goals for nutrients listed on the label. Shopping  Avoid buying canned, pre-made, or processed foods. These foods tend to be high in fat, sodium, and added  sugar.  Shop around the outside edge of the grocery store. This is where you will most often find fresh fruits and vegetables, bulk grains, fresh meats, and fresh dairy. Cooking  Use low-heat cooking methods, such as baking, instead of high-heat cooking methods like deep frying.  Cook using healthy oils, such as olive, canola, or sunflower oil.  Avoid cooking with butter, cream, or high-fat meats. Meal planning  Eat meals and snacks regularly, preferably at the same times every day. Avoid going long periods of time without eating.  Eat foods that are high in fiber, such as fresh fruits, vegetables, beans, and whole grains. Talk with your dietitian about how many servings of carbs you can eat at each meal.  Eat 4-6 oz (112-168 g) of lean protein each day, such as lean meat, chicken, fish, eggs, or tofu. One ounce (oz) of lean protein is equal to: ? 1 oz (28 g) of meat, chicken, or fish. ? 1 egg. ?  cup (62 g) of tofu.  Eat some foods each day that contain healthy fats, such as avocado, nuts, seeds, and fish.   What foods should I eat? Fruits Berries. Apples. Oranges. Peaches. Apricots. Plums. Grapes. Mango. Papaya. Pomegranate. Kiwi. Cherries. Vegetables Lettuce. Spinach. Leafy greens, including kale, chard, collard greens, and mustard greens. Beets. Cauliflower. Cabbage. Broccoli. Carrots. Green beans. Tomatoes. Peppers. Onions. Cucumbers. Brussels sprouts. Grains Whole grains, such as whole-wheat or whole-grain bread, crackers, tortillas, cereal, and pasta. Unsweetened oatmeal. Quinoa. Brown or wild rice. Meats and other proteins Seafood. Poultry without skin. Lean cuts of poultry and beef. Tofu. Nuts. Seeds. Dairy Low-fat or fat-free dairy products such as milk, yogurt, and cheese. The items listed above may not be a complete list of foods and beverages you can eat. Contact a dietitian for more information. What foods should I avoid? Fruits Fruits canned with  syrup. Vegetables Canned vegetables. Frozen vegetables with butter or cream sauce. Grains Refined white flour and flour products such as bread, pasta, snack foods, and cereals. Avoid all processed foods. Meats and other proteins Fatty cuts of meat. Poultry with skin. Breaded or fried meats. Processed meat. Avoid saturated fats. Dairy Full-fat yogurt, cheese, or milk. Beverages Sweetened drinks, such as soda or iced tea. The items listed above may not be a complete list of foods and beverages you should avoid. Contact a dietitian for more information. Questions to ask a health care provider  Do I need to meet with a diabetes educator?  Do I need to meet with a dietitian?  What number can I call if I have questions?  When are the best times to check my blood glucose? Where to find more information:  American Diabetes Association: diabetes.org  Academy of Nutrition and Dietetics: www.eatright.org  National Institute of Diabetes and Digestive and Kidney Diseases: www.niddk.nih.gov  Association of Diabetes Care and Education Specialists: www.diabeteseducator.org Summary  It is important to have healthy eating   habits because your blood sugar (glucose) levels are greatly affected by what you eat and drink.  A healthy meal plan will help you control your blood glucose and maintain a healthy lifestyle.  Your health care provider may recommend that you work with a dietitian to make a meal plan that is best for you.  Keep in mind that carbohydrates (carbs) and alcohol have immediate effects on your blood glucose levels. It is important to count carbs and to use alcohol carefully. This information is not intended to replace advice given to you by your health care provider. Make sure you discuss any questions you have with your health care provider. Document Revised: 04/22/2019 Document Reviewed: 04/22/2019 Elsevier Patient Education  2021 [employer]vier Inc.  

## 2021-01-19 NOTE — Progress Notes (Signed)
I,Tianna Badgett,acting as a Education administrator for [name], MD.,have documented all relevant documentation on the behalf of [name], MD,as directed by  [name], MD while in the presence of [name], MD.  This visit occurred during the SARS-CoV-2 public health emergency.  Safety protocols were in place, including screening questions prior to the visit, additional usage of staff PPE, and extensive cleaning of exam room while observing appropriate contact time as indicated for disinfecting solutions.  Subjective:     Patient ID: Sylvia Kelly , female    DOB: [birth year] , 66 y.o.   MRN: [id number]   Chief Complaint  Patient presents with   Diabetes   Hypertension    HPI  She presents today for DM/htn follow-up.  She reports compliance with meds. She denies headaches, chest pain and shortness of breath. She states her sugars are 68-78 in the mornings on 48 units Tresiba nightly.  Diabetes She presents for her follow-up diabetic visit. She has type 2 diabetes mellitus. Her disease course has been improving. There are no hypoglycemic associated symptoms. Pertinent negatives for diabetes include no blurred vision, no chest pain and no fatigue. There are no hypoglycemic complications. Diabetic complications include peripheral neuropathy. Risk factors for coronary artery disease include diabetes mellitus, dyslipidemia, sedentary lifestyle, hypertension and post-menopausal.  Hypertension This is a chronic problem. The current episode started more than 1 year ago. The problem has been gradually improving since onset. Pertinent negatives include no blurred vision, chest pain, palpitations or shortness of breath.    Past Medical History:  Diagnosis Date   Atrophic vaginitis    Diabetes mellitus    Elevated cholesterol    Hypertension      Family History  Problem Relation Age of Onset   Heart disease Mother    Diabetes Mother    Cancer Father        Lung cancer      Current Outpatient Medications:    aspirin 81 MG tablet, Take 81 mg by mouth daily., Disp: , Rfl:    atorvastatin (LIPITOR) 80 MG tablet, Take 1 tablet (80 mg total) by mouth daily., Disp: 90 tablet, Rfl: 1   Blood Glucose Monitoring Suppl (ONETOUCH VERIO REFLECT) w/Device KIT, Use as directed to check blood sugars 3 times per day dx: e11.65, Disp: 1 kit, Rfl: 1   dapagliflozin propanediol (FARXIGA) 10 MG TABS tablet, Take by mouth daily., Disp: , Rfl:    fluticasone (FLONASE) 50 MCG/ACT nasal spray, SPRAY 2 SPRAYS INTO EACH NOSTRIL EVERY DAY, Disp: 48 mL, Rfl: 1   ibuprofen (ADVIL,MOTRIN) 800 MG tablet, Take 1 tablet (800 mg total) by mouth 3 (three) times daily., Disp: 21 tablet, Rfl: 0   insulin degludec (TRESIBA FLEXTOUCH) 200 UNIT/ML FlexTouch Pen, Inject 56 Units into the skin at bedtime. (Patient taking differently: Inject 48 Units into the skin at bedtime.), Disp: 27 mL, Rfl: 2   Insulin Pen Needle (B-D UF III MINI PEN NEEDLES) 31G X 5 MM MISC, USE AS DIRECTED WITH LEVEMIR, Disp: 150 each, Rfl: 3   lisinopril-hydrochlorothiazide (ZESTORETIC) 20-25 MG tablet, TAKE 1/2 TABLET BY MOUTH EVERY DAY, Disp: 45 tablet, Rfl: 2   Multiple Vitamin (MULTIVITAMIN) tablet, Take 1 tablet by mouth daily., Disp: , Rfl:    OneTouch Delica Lancets 32I MISC, Use as directed to check blood sugars 3 times per day dx: e11.65, Disp: 150 each, Rfl: 3   ONETOUCH VERIO test strip, USE AS DIRECTED TO CHECK BLOOD SUGARS 3 TIMES  PER DAY DX: E11.65, Disp: 200 strip, Rfl: 3   OZEMPIC, 1 MG/DOSE, 4 MG/3ML SOPN, INJECT 1 MG INTO THE SKIN ONCE A WEEK., Disp: 3 mL, Rfl: 2   No Known Allergies   Review of Systems  Constitutional: Negative.  Negative for fatigue.  Eyes:  Negative for blurred vision.  Respiratory: Negative.  Negative for shortness of breath.   Cardiovascular: Negative.  Negative for chest pain and palpitations.  Gastrointestinal: Negative.   Neurological: Negative.     Today's Vitals   01/19/21  1104  BP: 116/70  Pulse: 74  Temp: 98.2 F (36.8 C)  TempSrc: Oral  Weight: 152 lb 9.6 oz (69.2 kg)  Height: 5' 4.4" (1.636 m)   Body mass index is 25.87 kg/m.  Wt Readings from Last 3 Encounters:  01/19/21 152 lb 9.6 oz (69.2 kg)  11/10/20 156 lb 3.2 oz (70.9 kg)  09/06/20 160 lb 3.2 oz (72.7 kg)    Objective:  Physical Exam Vitals and nursing note reviewed.  Constitutional:      Appearance: Normal appearance.  HENT:     Head: Normocephalic and atraumatic.     Nose:     Comments: Masked     Mouth/Throat:     Comments: Masked  Eyes:     Extraocular Movements: Extraocular movements intact.  Cardiovascular:     Rate and Rhythm: Normal rate and regular rhythm.     Heart sounds: Normal heart sounds.  Pulmonary:     Effort: Pulmonary effort is normal.     Breath sounds: Normal breath sounds.  Musculoskeletal:     Cervical back: Normal range of motion.  Skin:    General: Skin is warm.  Neurological:     General: No focal deficit present.     Mental Status: She is alert.  Psychiatric:        Mood and Affect: Mood normal.        Behavior: Behavior normal.        Assessment And Plan:     1. Type 2 diabetes mellitus with diabetic neuropathy, with long-term current use of insulin (HCC) Comments: Chronic, I will check labs as listed below. She is currently followed by CCM pharmacist for med assistance. I will decrease her to 45 units of Tresiba nightly. She was given a sample of both Ozempic and [place]. She will f/u in 3 months for re-evaluation.  - BMP8+EGFR - Hemoglobin A1c  2. Essential (primary) hypertension Comments: Chronic, well controlled. She is encouraged to follow low sodium diet.   3. Drug therapy  The [employer] pharmacogenomics process and benefits of testing was discussed with the patient. After the discussion, the patient made an informed consent to testing and the sample was collected and mailed to the external lab.  Patient was given opportunity to ask  questions. Patient verbalized understanding of the plan and was able to repeat key elements of the plan. All questions were answered to their satisfaction.   IF YOU HAVE BEEN REFERRED TO A SPECIALIST, IT MAY TAKE 1-2 WEEKS TO SCHEDULE/PROCESS THE REFERRAL. IF YOU HAVE NOT HEARD FROM US/SPECIALIST IN TWO WEEKS, PLEASE GIVE [place] A CALL AT [phone] X [phone].   THE PATIENT IS ENCOURAGED TO PRACTICE SOCIAL DISTANCING DUE TO THE COVID-19 PANDEMIC.

## 2021-01-20 LAB — HEMOGLOBIN A1C
Est. average glucose Bld gHb Est-mCnc: 146 mg/dL
Hgb A1c MFr Bld: 6.7 % — ABNORMAL HIGH (ref 4.8–5.6)

## 2021-01-20 LAB — BMP8+EGFR
BUN/Creatinine Ratio: 17 (ref 12–28)
BUN: 18 mg/dL (ref 8–27)
CO2: 22 mmol/L (ref 20–29)
Calcium: 9.4 mg/dL (ref 8.7–10.3)
Chloride: 98 mmol/L (ref 96–106)
Creatinine, Ser: 1.03 mg/dL — ABNORMAL HIGH (ref 0.57–1.00)
Glucose: 187 mg/dL — ABNORMAL HIGH (ref 65–99)
Potassium: 4.7 mmol/L (ref 3.5–5.2)
Sodium: 138 mmol/L (ref 134–144)
eGFR: 60 mL/min/{1.73_m2} (ref >59)

## 2021-01-23 ENCOUNTER — Encounter: Payer: Self-pay | Admitting: Internal Medicine

## 2021-01-24 ENCOUNTER — Other Ambulatory Visit: Payer: Self-pay

## 2021-01-24 ENCOUNTER — Ambulatory Visit (AMBULATORY_SURGERY_CENTER): Payer: Self-pay | Admitting: *Deleted

## 2021-01-24 VITALS — Ht 63.5 in | Wt 152.0 lb

## 2021-01-24 DIAGNOSIS — Z1211 Encounter for screening for malignant neoplasm of colon: Secondary | ICD-10-CM

## 2021-01-24 MED ORDER — SUTAB 1479-225-188 MG PO TABS: 24.0000 | ORAL_TABLET | ORAL | 0 refills | Status: DC

## 2021-01-24 NOTE — Progress Notes (Signed)
No egg or soy allergy known to patient  No issues with past sedation with any surgeries or procedures Patient denies ever being told they had issues or difficulty with intubation  No FH of Malignant Hyperthermia No diet pills per patient No home 02 use per patient  No blood thinners per patient  Pt denies issues with constipation daily- occ has issues- uses MOM and increases water and that helps-  No A fib or A flutter  EMMI video to pt or via MyChart  COVID 19 guidelines implemented in PV today with Pt and RN   Pt is fully vaccinated  for [employer] given to pt in PV today , Code to Pharmacy and  NO PA's for preps discussed with pt In PV today  Discussed with pt there will be an out-of-pocket cost for prep and that varies from $0 to 70 +  dollars   Due to the COVID-19 pandemic we are asking patients to follow certain guidelines.  Pt aware of COVID protocols and [facility] guidelines

## 2021-01-25 NOTE — Patient Instructions (Signed)
Visit Information It was great speaking with you today!  Please let me know if you have any questions about our visit.   Goals Addressed             This Visit's Progress    Manage My Medicine       Timeframe:  Long-Range Goal Priority:  High Start Date:                             Expected End Date:                       Follow Up Date 04/14/2021    - call for medicine refill 2 or 3 days before it runs out - call if I am sick and can't take my medicine - keep a list of all the medicines I take; vitamins and herbals too    Why is this important?   These steps will help you keep on track with your medicines.   Notes:  Please call with any questions or concerns        Patient Care Plan: CCM Pharmacy Care Plan     Problem Identified: HTN, HLD, DM II      Long-Range Goal: Disease Management   This Visit's Progress: On track  Note:    Current Barriers:  Unable to independently afford treatment regimen Unable to achieve control of Diabetes   Pharmacist Clinical Goal(s):  Patient will verbalize ability to afford treatment regimen achieve adherence to monitoring guidelines and medication adherence to achieve therapeutic efficacy through collaboration with PharmD and provider.   Interventions: 1:1 collaboration with Glendale Chard, MD regarding development and update of comprehensive plan of care as evidenced by provider attestation and co-signature Inter-disciplinary care team collaboration (see longitudinal plan of care) Comprehensive medication review performed; medication list updated in electronic medical record  Hypertension (BP goal <130/80) -Controlled -Current treatment: Lisinopril - Hydrochlorothiazide 20- 25 mg taking 1 tablet by mouth daily  -Current home readings: she does not currently check her BP at home, because it is good in the office -Current dietary habits: she is limiting salt in her diet  -Current exercise habits: please see diabetes for  more information  -Denies hypotensive/hypertensive symptoms -Educated on Daily salt intake goal < 2300 mg; -Counseled to monitor BP at home at least three times per week, document, and provide log at future appointments -Recommended to continue current medication  Hyperlipidemia: (LDL goal < 70) -Not ideally controlled -Current treatment: Atorvastatin 80 mg tablet once per day  -Medications previously tried: Zetia,   -Current dietary patterns: pork chops, broiled chicken,  -Current exercise habits: please see diabetes  -Educated on Cholesterol goals;  Benefits of statin for ASCVD risk reduction; -Recommended to continue current medication  Diabetes (A1c goal <7%) -Controlled -Current medications: Ozempic 1 mg/dose - inject 1 mg once a week Farxiga 10 mg - taking 1 tablet by mouth daily  Tresiba inject 52 units at bedtime  -Medications previously tried: Levemir, Janumet 50-1000 mg, Humalog,  -Current home glucose readings fasting glucose: 69-78 -Denies hypoglycemic/hyperglycemic symptoms -Current meal patterns:  breakfast: no longer has appetite lunch: subway sandwich, with vegetables and oil& vinegar  dinner: she goes to a farm and get fresh vegetables and a protein  snacks: sometimes a few potato chips, or tostititos with cheese or salsa as a snack but not everyday.  drinks: cup of coffee  -Current exercise: she is walking  in her neighborhood for 30-40 minutes, she reports that she needs to join the silver sneakers program.  -Educated on A1c and blood sugar goals; Complications of diabetes including kidney damage, retinal damage, and cardiovascular disease; Exercise goal of 150 minutes per week; -Counseled to check feet daily and get yearly eye exams -Recommended to continue current medication Collaborated with PCP to change patients current Tresiba dose by 3 units due to low readings   Patient Goals/Self-Care Activities Patient will:  - take medications as  prescribed collaborate with provider on medication access solutions  Follow Up Plan: The patient has been provided with contact information for the care management team and has been advised to call with any health related questions or concerns.       Ms. Cobler was given information about Chronic Care Management services today including:  CCM service includes personalized support from designated clinical staff supervised by her physician, including individualized plan of care and coordination with other care providers 24/7 contact phone numbers for assistance for urgent and routine care needs. Standard insurance, coinsurance, copays and deductibles apply for chronic care management only during months in which we provide at least 20 minutes of these services. Most insurances cover these services at 100%, however patients may be responsible for any copay, coinsurance and/or deductible if applicable. This service may help you avoid the need for more expensive face-to-face services. Only one practitioner may furnish and bill the service in a calendar month. The patient may stop CCM services at any time (effective at the end of the month) by phone call to the office staff.  Patient agreed to services and verbal consent obtained.   The patient verbalized understanding of instructions, educational materials, and care plan provided today and agreed to receive a mailed copy of patient instructions, educational materials, and care plan.   [name], PharmD Clinical Pharmacist [facility] [phone]

## 2021-01-30 ENCOUNTER — Encounter: Payer: Self-pay | Admitting: Internal Medicine

## 2021-02-01 ENCOUNTER — Other Ambulatory Visit: Payer: Self-pay

## 2021-02-03 ENCOUNTER — Telehealth: Payer: Self-pay

## 2021-02-03 ENCOUNTER — Other Ambulatory Visit: Payer: Self-pay | Admitting: Internal Medicine

## 2021-02-03 DIAGNOSIS — Z79899 Other long term (current) drug therapy: Secondary | ICD-10-CM

## 2021-02-03 NOTE — Chronic Care Management (AMB) (Signed)
Chronic Care Management Pharmacy Assistant   Name: Sylvia Kelly  MRN: [id number] DOB: 09/26/1955   Reason for Encounter: Disease State/ Diabetes  Recent office visits:  01-19-2021 Glendale Chard, MD. Glucose= 187, Creatinine= 1.03. A1C= 6.7. STOP Ryvent.  Recent consult visits:  01-24-2021 Steva Ready, RN. Referral placed to Gastroenterology. START Sodium Sulfate-Mag Sulfate-KCl [phone] MG take as directed. Special screening for colon.  Hospital visits:  None in previous 6 months  Medications: Outpatient Encounter Medications as of 02/03/2021  Medication Sig   aspirin 81 MG tablet Take 81 mg by mouth daily.   atorvastatin (LIPITOR) 80 MG tablet Take 1 tablet (80 mg total) by mouth daily.   Blood Glucose Monitoring Suppl (ONETOUCH VERIO REFLECT) w/Device KIT Use as directed to check blood sugars 3 times per day dx: e11.65   dapagliflozin propanediol (FARXIGA) 10 MG TABS tablet Take by mouth daily.   fluticasone (FLONASE) 50 MCG/ACT nasal spray SPRAY 2 SPRAYS INTO EACH NOSTRIL EVERY DAY   ibuprofen (ADVIL,MOTRIN) 800 MG tablet Take 1 tablet (800 mg total) by mouth 3 (three) times daily.   insulin degludec (TRESIBA FLEXTOUCH) 200 UNIT/ML FlexTouch Pen Inject 56 Units into the skin at bedtime. (Patient taking differently: Inject 48 Units into the skin at bedtime.)   Insulin Pen Needle (B-D UF III MINI PEN NEEDLES) 31G X 5 MM MISC USE AS DIRECTED WITH LEVEMIR   lisinopril-hydrochlorothiazide (ZESTORETIC) 20-25 MG tablet TAKE 1/2 TABLET BY MOUTH EVERY DAY   Multiple Vitamin (MULTIVITAMIN) tablet Take 1 tablet by mouth daily.   OneTouch Delica Lancets 46N MISC Use as directed to check blood sugars 3 times per day dx: e11.65   ONETOUCH VERIO test strip USE AS DIRECTED TO CHECK BLOOD SUGARS 3 TIMES PER DAY DX: E11.65   OZEMPIC, 1 MG/DOSE, 4 MG/3ML SOPN INJECT 1 MG INTO THE SKIN ONCE A WEEK.   Sodium Sulfate-Mag Sulfate-KCl (SUTAB) [phone] MG TABS Take 24 tablets by  mouth as directed. 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 BIN: [id number] PCN: CN GROUP: [id number] MEMBER ID: [id number];VZD AS SECONDARY INSURANCE ;NO PRIOR AUTHORIZATION   No facility-administered encounter medications on file as of 02/03/2021.   Recent Relevant Labs: Lab Results  Component Value Date/Time   HGBA1C 6.7 (H) 01/19/2021 12:22 PM   HGBA1C 8.7 (H) 09/06/2020 11:53 AM   HGBA1C 7.7 01/16/2018 12:00 AM   MICROALBUR 30 11/10/2020 12:48 PM   MICROALBUR 30 11/04/2019 11:00 AM    Kidney Function Lab Results  Component Value Date/Time   CREATININE 1.03 (H) 01/19/2021 12:22 PM   CREATININE 0.99 09/06/2020 11:53 AM   GFRNONAA 75 06/09/2020 11:01 AM   GFRAA 86 06/09/2020 11:01 AM    Current antihyperglycemic regimen:  Ozempic 1 mg/dose - inject 1 mg once a week Farxiga 10 mg - taking 1 tablet by mouth daily  Tresiba inject 52 units at bedtime   What recent interventions/DTPs have been made to improve glycemic control:  Educated on A1c and blood sugar goals Complications of diabetes including kidney damage, retinal damage, and cardiovascular disease Exercise goal of 150 minutes per week Counseled to check feet daily and get yearly eye exams  Have there been any recent hospitalizations or ED visits since last visit with CPP? No  Patient denies hypoglycemic symptoms  Patient denies hyperglycemic symptoms  How often are you checking your blood sugar? once daily  What are your blood sugars ranging?  Fasting: 101 Before meals: None After meals: None Bedtime: None  During the week, how often does your  blood glucose drop below 70? Never Patient states it was around 51 not too long ago when she got her colonoscopy.  Are you checking your feet daily/regularly? Patient states daily  Adherence Review: Is the patient currently on a STATIN medication? Yes Is the patient currently on ACE/ARB medication? Yes Does the patient have >5 day gap between last estimated fill dates? No  NOTES: [facility] and was informed [name] was approved on 01-26-2021 and will be shipped to office in a few weeks. Representative also stated Ozempic application wasn't faxed and to send pages 4-5 to add. Informed [name] to fax pages for [name]. Called [name] to follow up with [name] and it was approved. Left patient a VM to call back. [name] informed me that patient was approved for Ozempic. 02-16-2021: [name] following up on Tresiba shipment and was informed that there wasn't a tracking number and today 02-16-2021 will be 14 business days from approval. Representative stated if medication isn't delivered today to call back tomorrow for tracking number. Patient informed  Care Gaps: Annual Wellness Visit- 06/13/2021    COVID-19 Vaccine (4 - Booster for Moderna series) OPHTHALMOLOGY EXAM Ammie Dalton completed: Aug 08, 2019 PAP SMEAR-Modifier (Yearly)- Last completed: Dec 15, 2019  Star Rating Drugs: Atorvastatin 80 mg- Last filled [facility]/02/2020 [facility] day [facility]. [name] 10 mg- Last filled 09/29/2020 for [facility] day supply at [facility] (Patient assistance ) Ozempic- Last filled 11/23/2020 for [facility] day supply at [facility] (Patient assistance) Lisinopril-HCTZ 20/25 mg- Last filled 12/02/2020 for 90 day supply at [facility] Pharmacist Assistant [phone]

## 2021-02-04 ENCOUNTER — Encounter: Payer: Self-pay | Admitting: Internal Medicine

## 2021-02-07 ENCOUNTER — Encounter: Payer: Self-pay | Admitting: Gastroenterology

## 2021-02-07 ENCOUNTER — Other Ambulatory Visit: Payer: Self-pay

## 2021-02-07 ENCOUNTER — Ambulatory Visit (AMBULATORY_SURGERY_CENTER): Payer: Medicare Other | Admitting: Gastroenterology

## 2021-02-07 VITALS — BP 106/66 | HR 74 | Temp 97.8°F | Resp 15 | Ht 63.5 in | Wt 152.0 lb

## 2021-02-07 DIAGNOSIS — Z1211 Encounter for screening for malignant neoplasm of colon: Secondary | ICD-10-CM | POA: Diagnosis not present

## 2021-02-07 DIAGNOSIS — D123 Benign neoplasm of transverse colon: Secondary | ICD-10-CM

## 2021-02-07 DIAGNOSIS — K5140 Inflammatory polyps of colon without complications: Secondary | ICD-10-CM

## 2021-02-07 MED ORDER — SODIUM CHLORIDE 0.9 % IV SOLN: 500.0000 mL | INTRAVENOUS | Status: DC

## 2021-02-07 MED ORDER — DEXTROSE 5 % IV SOLN
INTRAVENOUS | Status: AC
Start: 2021-02-07 — End: ?

## 2021-02-07 NOTE — Progress Notes (Signed)
Patient blood sugar 68 with complaints of feeling shaky and weak.  Ok to infuse d5w per Dr Silverio Decamp.   1400 Blood sugar re-check now 111

## 2021-02-07 NOTE — Progress Notes (Signed)
To PACU, VSS. Report to [email or web address] 

## 2021-02-07 NOTE — Patient Instructions (Signed)
Resume previous diet and continue present medications. Awaiting pathology, repeat colonoscopy in 3-5 years based on pathology results. No aspirin, ibuprofen or other non-steroidal anti-inflammatory drugs for 2 weeks.  YOU HAD AN ENDOSCOPIC PROCEDURE TODAY AT [facility]:   Refer to the procedure report that was given to you for any specific questions about what was found during the examination.  If the procedure report does not answer your questions, please call your gastroenterologist to clarify.  If you requested that your care partner not be given the details of your procedure findings, then the procedure report has been included in a sealed envelope for you to review at your convenience later.  YOU SHOULD EXPECT: Some feelings of bloating in the abdomen. Passage of more gas than usual.  Walking can help get rid of the air that was put into your GI tract during the procedure and reduce the bloating. If you had a lower endoscopy (such as a colonoscopy or flexible sigmoidoscopy) you may notice spotting of blood in your stool or on the toilet paper. If you underwent a bowel prep for your procedure, you may not have a normal bowel movement for a few days.  Please Note:  You might notice some irritation and congestion in your nose or some drainage.  This is from the oxygen used during your procedure.  There is no need for concern and it should clear up in a day or so.  SYMPTOMS TO REPORT IMMEDIATELY:  Following lower endoscopy (colonoscopy or flexible sigmoidoscopy):  Excessive amounts of blood in the stool  Significant tenderness or worsening of abdominal pains  Swelling of the abdomen that is new, acute  Fever of 100F or higher  For urgent or emergent issues, a gastroenterologist can be reached at any hour by calling [phone]. Do not use MyChart messaging for urgent concerns.    DIET:  We do recommend a small meal at first, but then you may proceed to your regular diet.   Drink plenty of fluids but you should avoid alcoholic beverages for 24 hours.  ACTIVITY:  You should plan to take it easy for the rest of today and you should NOT DRIVE or use heavy machinery until tomorrow (because of the sedation medicines used during the test).    FOLLOW UP: Our staff will call the number listed on your records 48-72 hours following your procedure to check on you and address any questions or concerns that you may have regarding the information given to you following your procedure. If we do not reach you, we will leave a message.  We will attempt to reach you two times.  During this call, we will ask if you have developed any symptoms of COVID 19. If you develop any symptoms (ie: fever, flu-like symptoms, shortness of breath, cough etc.) before then, please call [phone].  If you test positive for Covid 19 in the 2 weeks post procedure, please call and report this information to [place].    If any biopsies were taken you will be contacted by phone or by letter within the next 1-3 weeks.  Please call us at [phone] if you have not heard about the biopsies in 3 weeks.    SIGNATURES/CONFIDENTIALITY: You and/or your care partner have signed paperwork which will be entered into your electronic medical record.  These signatures attest to the fact that that the information above on your After Visit Summary has been reviewed and is understood.  Full responsibility of the confidentiality  of this discharge information lies with you and/or your care-partner.  

## 2021-02-07 NOTE — Progress Notes (Signed)
[facility] History and Physical   Primary Care Physician:  Glendale Chard, MD   Reason for Procedure:  Colorectal cancer screening  Plan:    Screening colonoscopy with possible interventions as needed     HPI: Sylvia Kelly is a very pleasant 65 y.o. female here for screening colonoscopy. Denies any nausea, vomiting, abdominal pain, melena or bright red blood per rectum  The risks and benefits as well as alternatives of endoscopic procedure(s) have been discussed and reviewed. All questions answered. The patient agrees to proceed.    Past Medical History:  Diagnosis Date   Allergy    Arthritis    hip   Atrophic vaginitis    Diabetes mellitus    Elevated cholesterol    Hypertension    Neuromuscular disorder (HCC)    neuropathy hands and feet    Past Surgical History:  Procedure Laterality Date   COLONOSCOPY     10 yrs ago- [employer] - normal per pt   OVARIAN CYST SURGERY  05/29/1990   REFRACTIVE SURGERY     TUBAL LIGATION      Prior to Admission medications   Medication Sig Start Date End Date Taking? Authorizing Provider  aspirin 81 MG tablet Take 81 mg by mouth daily.   Yes [provider]  atorvastatin (LIPITOR) 80 MG tablet Take 1 tablet (80 mg total) by mouth daily. 06/09/20  Yes Ghumman, Ramandeep, NP  dapagliflozin propanediol (FARXIGA) 10 MG TABS tablet Take by mouth daily.   Yes [provider]  fluticasone (FLONASE) 50 MCG/ACT nasal spray SPRAY 2 SPRAYS INTO EACH NOSTRIL EVERY DAY 08/16/20  Yes Minette Brine, FNP  insulin degludec (TRESIBA FLEXTOUCH) 200 UNIT/ML FlexTouch Pen Inject 56 Units into the skin at bedtime. Patient taking differently: Inject 48 Units into the skin at bedtime. 06/09/20  Yes Ghumman, Ramandeep, NP  Insulin Pen Needle (B-D UF III MINI PEN NEEDLES) 31G X 5 MM MISC USE AS DIRECTED WITH LEVEMIR 07/23/20  Yes Glendale Chard, MD  lisinopril-hydrochlorothiazide (ZESTORETIC) 20-25 MG tablet TAKE 1/2 TABLET  BY MOUTH EVERY DAY 06/09/20  Yes Ghumman, Ramandeep, NP  OneTouch Delica Lancets 65V MISC Use as directed to check blood sugars 3 times per day dx: e11.65 09/06/20  Yes Glendale Chard, MD  [facility] VERIO test strip USE AS DIRECTED TO CHECK BLOOD SUGARS 3 TIMES PER DAY DX: E11.65 09/22/20  Yes Glendale Chard, MD  OZEMPIC, 1 MG/DOSE, 4 MG/3ML SOPN INJECT 1 MG INTO THE SKIN ONCE A WEEK. 11/22/20  Yes Ghumman, Ramandeep, NP  Blood Glucose Monitoring Suppl (ONETOUCH VERIO REFLECT) w/Device KIT Use as directed to check blood sugars 3 times per day dx: e11.65 01/19/20   Glendale Chard, MD  ibuprofen (ADVIL,MOTRIN) 800 MG tablet Take 1 tablet (800 mg total) by mouth 3 (three) times daily. 04/10/15   Domenic Moras, PA-C  Multiple Vitamin (MULTIVITAMIN) tablet Take 1 tablet by mouth daily.    [provider]    Current Outpatient Medications  Medication Sig Dispense Refill   aspirin 81 MG tablet Take 81 mg by mouth daily.     atorvastatin (LIPITOR) 80 MG tablet Take 1 tablet (80 mg total) by mouth daily. 90 tablet 1   dapagliflozin propanediol (FARXIGA) 10 MG TABS tablet Take by mouth daily.     fluticasone (FLONASE) 50 MCG/ACT nasal spray SPRAY 2 SPRAYS INTO EACH NOSTRIL EVERY DAY 48 mL 1   insulin degludec (TRESIBA FLEXTOUCH) 200 UNIT/ML FlexTouch Pen Inject 56 Units into the skin at bedtime. (  Patient taking differently: Inject 48 Units into the skin at bedtime.) 27 mL 2   Insulin Pen Needle (B-D UF III MINI PEN NEEDLES) 31G X 5 MM MISC USE AS DIRECTED WITH LEVEMIR 150 each 3   lisinopril-hydrochlorothiazide (ZESTORETIC) 20-25 MG tablet TAKE 1/2 TABLET BY MOUTH EVERY DAY 45 tablet 2   OneTouch Delica Lancets 20U MISC Use as directed to check blood sugars 3 times per day dx: e11.65 150 each 3   ONETOUCH VERIO test strip USE AS DIRECTED TO CHECK BLOOD SUGARS 3 TIMES PER DAY DX: E11.65 200 strip 3   OZEMPIC, 1 MG/DOSE, 4 MG/3ML SOPN INJECT 1 MG INTO THE SKIN ONCE A WEEK. 3 mL 2   Blood Glucose Monitoring  Suppl (ONETOUCH VERIO REFLECT) w/Device KIT Use as directed to check blood sugars 3 times per day dx: e11.65 1 kit 1   ibuprofen (ADVIL,MOTRIN) 800 MG tablet Take 1 tablet (800 mg total) by mouth 3 (three) times daily. 21 tablet 0   Multiple Vitamin (MULTIVITAMIN) tablet Take 1 tablet by mouth daily.     Current Facility-Administered Medications  Medication Dose Route Frequency Provider Last Rate Last Admin   0.9 %  sodium chloride infusion  500 mL Intravenous Continuous Ocie Stanzione, Venia Minks, MD       dextrose 5 % solution   Intravenous Continuous Burton Gahan, Venia Minks, MD        Allergies as of 02/07/2021   (No Known Allergies)    Family History  Problem Relation Age of Onset   Heart disease Mother    Diabetes Mother    Cancer Father        Lung cancer   Colon cancer Neg Hx    Colon polyps Neg Hx    Esophageal cancer Neg Hx    Rectal cancer Neg Hx    Stomach cancer Neg Hx     Social History   Socioeconomic History   Marital status: Married    Spouse name: Not on file   Number of children: Not on file   Years of education: Not on file   Highest education level: Not on file  Occupational History   Not on file  Tobacco Use   Smoking status: Never   Smokeless tobacco: Never  Vaping Use   Vaping Use: Never used  Substance and Sexual Activity   Alcohol use: No   Drug use: No   Sexual activity: Yes    Birth control/protection: Surgical  Other Topics Concern   Not on file  Social History Narrative   Not on file   Social Determinants of Health   Financial Resource Strain: Not on file  Food Insecurity: Not on file  Transportation Needs: Not on file  Physical Activity: Not on file  Stress: Not on file  Social Connections: Not on file  Intimate Partner Violence: Not on file    Review of Systems:  All other review of systems negative except as mentioned in the HPI.  Physical Exam: Vital signs in last 24 hours: BP 101/63   Pulse 90   Temp 97.8 F (36.6 C)  (Temporal)   Ht 5' 3.5" (1.613 m)   Wt 152 lb (68.9 kg)   SpO2 98%   BMI 26.50 kg/m     General:   Alert, NAD Lungs:  Clear .   Heart:  Regular rate and rhythm Abdomen:  Soft, nontender and nondistended. Neuro/Psych:  Alert and cooperative. Normal mood and affect. A and O x 3  Reviewed labs,  radiology imaging, old records and pertinent past GI work up  Patient is appropriate for planned procedure(s) and anesthesia in an ambulatory setting   K. Denzil Magnuson , MD [phone]

## 2021-02-07 NOTE — Op Note (Signed)
[facility] Patient Name: Sylvia Kelly Procedure Date: 02/07/2021 2:14 PM MRN: [id number] Endoscopist: Mauri Pole , MD Age: 65 Referring MD:  Date of Birth: 1956/02/20 Gender: Female Account #: [id number] Procedure:                Colonoscopy Indications:              Screening for colorectal malignant neoplasm Medicines:                Monitored Anesthesia Care Procedure:                Pre-Anesthesia Assessment:                           - Prior to the procedure, a History and Physical                            was performed, and patient medications and                            allergies were reviewed. The patient's tolerance of                            previous anesthesia was also reviewed. The risks                            and benefits of the procedure and the sedation                            options and risks were discussed with the patient.                            All questions were answered, and informed consent                            was obtained. Prior Anticoagulants: The patient has                            taken no previous anticoagulant or antiplatelet                            agents. ASA Grade Assessment: II - A patient with                            mild systemic disease. After reviewing the risks                            and benefits, the patient was deemed in                            satisfactory condition to undergo the procedure.                           After obtaining informed consent, the colonoscope  was passed under direct vision. Throughout the                            procedure, the patient's blood pressure, pulse, and                            oxygen saturations were monitored continuously. The                            Olympus [id number]-H190DL [id number]) Colonoscope was                            introduced through the anus and advanced to the the                            cecum,  identified by appendiceal orifice and                            ileocecal valve. The colonoscopy was performed                            without difficulty. The patient tolerated the                            procedure well. The quality of the bowel                            preparation was adequate. The ileocecal valve,                            appendiceal orifice, and rectum were photographed. Scope In: 2:23:31 PM Scope Out: 2:43:09 PM Scope Withdrawal Time: 0 hours 13 minutes 0 seconds  Total Procedure Duration: 0 hours 19 minutes 38 seconds  Findings:                 The perianal and digital rectal examinations were                            normal.                           A 10 mm polyp was found in the transverse colon.                            The polyp was sessile. The polyp was removed with a                            cold snare. Resection and retrieval were complete.                            Noted brisk oozing of bleeding after polypectomy,                            one hemostatic clip was successfully placed (MR  conditional) for hemostasis. There was no bleeding                            at the end of the procedure.                           Scattered small-mouthed diverticula were found in                            the sigmoid colon, descending colon and ascending                            colon.                           Non-bleeding external and internal hemorrhoids were                            found during retroflexion. The hemorrhoids were                            small. Complications:            No immediate complications. Estimated Blood Loss:     Estimated blood loss was minimal. Impression:               - One 10 mm polyp in the transverse colon, removed                            with a cold snare. Resected and retrieved. Clip (MR                            conditional) was placed.                           -  Diverticulosis in the sigmoid colon, in the                            descending colon and in the ascending colon.                           - Non-bleeding external and internal hemorrhoids. Recommendation:           - Patient has a contact number available for                            emergencies. The signs and symptoms of potential                            delayed complications were discussed with the                            patient. Return to normal activities tomorrow.                            Written discharge instructions were provided to the  patient.                           - Resume previous diet.                           - Continue present medications.                           - Await pathology results.                           - Repeat colonoscopy in 3 - 5 years for                            surveillance based on pathology results.                           - No ibuprofen, naproxen, or other non-steroidal                            anti-inflammatory drugs for 2 weeks. Mauri Pole, MD 02/07/2021 2:49:45 PM This report has been signed electronically.

## 2021-02-07 NOTE — Progress Notes (Signed)
Called to room to assist during endoscopic procedure.  Patient ID and intended procedure confirmed with present staff. Received instructions for my participation in the procedure from the performing physician.  

## 2021-02-09 ENCOUNTER — Telehealth: Payer: Self-pay

## 2021-02-09 ENCOUNTER — Encounter: Payer: Self-pay | Admitting: Internal Medicine

## 2021-02-09 NOTE — Telephone Encounter (Signed)
No answer, left message to call back later today, B.Demaris Leavell RN. 

## 2021-02-09 NOTE — Telephone Encounter (Signed)
Left message on follow up call. 

## 2021-02-11 ENCOUNTER — Encounter: Payer: Self-pay | Admitting: Internal Medicine

## 2021-02-15 ENCOUNTER — Other Ambulatory Visit: Payer: Self-pay | Admitting: Internal Medicine

## 2021-02-15 DIAGNOSIS — Z1231 Encounter for screening mammogram for malignant neoplasm of breast: Secondary | ICD-10-CM

## 2021-02-16 ENCOUNTER — Encounter: Payer: Self-pay | Admitting: Gastroenterology

## 2021-02-24 ENCOUNTER — Other Ambulatory Visit: Payer: Self-pay

## 2021-02-24 ENCOUNTER — Ambulatory Visit
Admission: RE | Admit: 2021-02-24 | Discharge: 2021-02-24 | Disposition: A | Discharge status: Home / Self Care | Payer: Medicare Other | Source: Ambulatory Visit

## 2021-02-24 DIAGNOSIS — Z1231 Encounter for screening mammogram for malignant neoplasm of breast: Secondary | ICD-10-CM

## 2021-03-02 ENCOUNTER — Encounter: Payer: Self-pay | Admitting: Internal Medicine

## 2021-03-03 ENCOUNTER — Telehealth: Payer: Self-pay

## 2021-03-03 NOTE — Chronic Care Management (AMB) (Signed)
03/03/2021- Left message informing patient Ozempic has arrived to PCP office from [employer] patient assistance program. No answer, left message informing that she may pick up today before 5 pm or next week.  [name], [place] Pharmacist Assistant [phone]

## 2021-03-07 ENCOUNTER — Telehealth: Payer: Self-pay

## 2021-03-07 NOTE — Chronic Care Management (AMB) (Signed)
    Chronic Care Management Pharmacy Assistant   Name: DENETTA FEI  MRN: [id number] DOB: September 01, 1955   Reason for Encounter: [name] patient assistance     Medications: Outpatient Encounter Medications as of 03/07/2021  Medication Sig   aspirin 81 MG tablet Take 81 mg by mouth daily.   atorvastatin (LIPITOR) 80 MG tablet Take 1 tablet (80 mg total) by mouth daily.   Blood Glucose Monitoring Suppl (ONETOUCH VERIO REFLECT) w/Device KIT Use as directed to check blood sugars 3 times per day dx: e11.65   dapagliflozin propanediol (FARXIGA) 10 MG TABS tablet Take by mouth daily.   fluticasone (FLONASE) 50 MCG/ACT nasal spray SPRAY 2 SPRAYS INTO EACH NOSTRIL EVERY DAY   ibuprofen (ADVIL,MOTRIN) 800 MG tablet Take 1 tablet (800 mg total) by mouth 3 (three) times daily.   insulin degludec (TRESIBA FLEXTOUCH) 200 UNIT/ML FlexTouch Pen Inject 56 Units into the skin at bedtime. (Patient taking differently: Inject 48 Units into the skin at bedtime.)   Insulin Pen Needle (B-D UF III MINI PEN NEEDLES) 31G X 5 MM MISC USE AS DIRECTED WITH LEVEMIR   lisinopril-hydrochlorothiazide (ZESTORETIC) 20-25 MG tablet TAKE 1/2 TABLET BY MOUTH EVERY DAY   Multiple Vitamin (MULTIVITAMIN) tablet Take 1 tablet by mouth daily.   OneTouch Delica Lancets 88I MISC Use as directed to check blood sugars 3 times per day dx: e11.65   ONETOUCH VERIO test strip USE AS DIRECTED TO CHECK BLOOD SUGARS 3 TIMES PER DAY DX: E11.65   OZEMPIC, 1 MG/DOSE, 4 MG/3ML SOPN INJECT 1 MG INTO THE SKIN ONCE A WEEK.   Facility-Administered Encounter Medications as of 03/07/2021  Medication   0.9 %  sodium chloride infusion   dextrose 5 % solution   03-07-2021: received a message from patient that she will be out of tresiba tomorrow. Contacted [employer] and was informed that they are experiencing shipping delays and patient is eligible for a 30 day free voucher. Called [facility] with information and medication is being filled. Informed patient. BIN  [id number] PCN [id number] [id number] ID [id number]  Care Gaps: Annual Wellness Visit- 06/13/2021    COVID-19 Vaccine (4 - Booster for Moderna series) OPHTHALMOLOGY EXAM Ammie Dalton completed: Aug 08, 2019 PAP SMEAR-Modifier (Yearly)- Last completed: Dec 15, 2019  Star Rating Drugs: Atorvastatin 80 mg- Last filled 12/02/2020 [facility] day [facility]. [name] 10 mg- Last filled 09/29/2020 for [facility] day [facility] (Patient assistance ) Ozempic- Last filled 11/23/2020 for [facility] day supply at [facility] (Patient assistance) Lisinopril-HCTZ 20/25 mg- Last filled 12/02/2020 for 90 day supply at [place] Pharmacist Assistant [phone]

## 2021-03-09 ENCOUNTER — Other Ambulatory Visit: Payer: Self-pay

## 2021-03-09 ENCOUNTER — Telehealth: Payer: Self-pay

## 2021-03-09 MED ORDER — ATORVASTATIN CALCIUM 80 MG PO TABS: 80.0000 mg | ORAL_TABLET | Freq: Every day | ORAL | 1 refills | Status: DC

## 2021-03-09 NOTE — Chronic Care Management (AMB) (Signed)
03/09/2021-Tresiba and needles arrived to office today from [employer] patient assistance program. Called patient, she is aware and will pick up medication and needles tomorrow.   [name], [place] Pharmacist Assistant [phone]

## 2021-03-09 NOTE — Chronic Care Management (AMB) (Signed)
Chronic Care Management Pharmacy Assistant   Name: LATRENDA IRANI  MRN: [id number] DOB: [birth year]   Reason for Encounter: Disease State/ Diabetes  Recent office visits:  None  Recent consult visits:  02-07-2021 Mauri Pole, MD. Screening for colonoscopy  Hospital visits:  None in previous 6 months  Medications: Outpatient Encounter Medications as of 03/09/2021  Medication Sig   aspirin 81 MG tablet Take 81 mg by mouth daily.   atorvastatin (LIPITOR) 80 MG tablet Take 1 tablet (80 mg total) by mouth daily.   Blood Glucose Monitoring Suppl (ONETOUCH VERIO REFLECT) w/Device KIT Use as directed to check blood sugars 3 times per day dx: e11.65   dapagliflozin propanediol (FARXIGA) 10 MG TABS tablet Take by mouth daily.   fluticasone (FLONASE) 50 MCG/ACT nasal spray SPRAY 2 SPRAYS INTO EACH NOSTRIL EVERY DAY   ibuprofen (ADVIL,MOTRIN) 800 MG tablet Take 1 tablet (800 mg total) by mouth 3 (three) times daily.   insulin degludec (TRESIBA FLEXTOUCH) 200 UNIT/ML FlexTouch Pen Inject 56 Units into the skin at bedtime. (Patient taking differently: Inject 48 Units into the skin at bedtime.)   Insulin Pen Needle (B-D UF III MINI PEN NEEDLES) 31G X 5 MM MISC USE AS DIRECTED WITH LEVEMIR   lisinopril-hydrochlorothiazide (ZESTORETIC) 20-25 MG tablet TAKE 1/2 TABLET BY MOUTH EVERY DAY   Multiple Vitamin (MULTIVITAMIN) tablet Take 1 tablet by mouth daily.   OneTouch Delica Lancets 17B MISC Use as directed to check blood sugars 3 times per day dx: e11.65   ONETOUCH VERIO test strip USE AS DIRECTED TO CHECK BLOOD SUGARS 3 TIMES PER DAY DX: E11.65   OZEMPIC, 1 MG/DOSE, 4 MG/3ML SOPN INJECT 1 MG INTO THE SKIN ONCE A WEEK.   Facility-Administered Encounter Medications as of 03/09/2021  Medication   0.9 %  sodium chloride infusion   dextrose 5 % solution   Recent Relevant Labs: Lab Results  Component Value Date/Time   HGBA1C 6.7 (H) 01/19/2021 12:22 PM   HGBA1C 8.7 (H) 09/06/2020  11:53 AM   HGBA1C 7.7 01/16/2018 12:00 AM   MICROALBUR 30 11/10/2020 12:48 PM   MICROALBUR 30 11/04/2019 11:00 AM    Kidney Function Lab Results  Component Value Date/Time   CREATININE 1.03 (H) 01/19/2021 12:22 PM   CREATININE 0.99 09/06/2020 11:53 AM   GFRNONAA 75 06/09/2020 11:01 AM   GFRAA 86 06/09/2020 11:01 AM    Current antihyperglycemic regimen:  Ozempic 1 mg weekly Farxiga 10 mg daily Tresiba inject 44 units at bedtime   What recent interventions/DTPs have been made to improve glycemic control:  Educated on A1c and blood sugar goals Complications of diabetes including kidney damage, retinal damage, and cardiovascular disease Exercise goal of 150 minutes per week Counseled to check feet daily and get yearly eye exams  Have there been any recent hospitalizations or ED visits since last visit with CPP? No  Patient denies hypoglycemic symptoms  Patient denies hyperglycemic symptoms  How often are you checking your blood sugar? once daily  What are your blood sugars ranging?  Fasting: 75, 95, 68 Before meals: None After meals: None Bedtime: None  During the week, how often does your blood glucose drop below 70? Patient stated maybe 3 times weekly. Patient states it happens when she doesn't eat enough the night before.  Are you checking your feet daily/regularly? Patient stated daily  Adherence Review: Is the patient currently on a STATIN medication? Yes Is the patient currently on ACE/ARB medication? Yes Does the patient  have >5 day gap between last estimated fill dates? No  NOTE: [facility] and [facility] are filling lisinopril/HCTZ but atorvastatin needs refills. Sent message to [facility] CMA.  Care Gaps: Annual Wellness Visit- 06/13/2021    COVID-19 Vaccine (4 - Booster for Moderna series) OPHTHALMOLOGY EXAM Ammie Dalton completed: Aug 08, 2019 PAP SMEAR-Modifier (Yearly)- Last completed: Dec 15, 2019  Star Rating Drugs: Atorvastatin 80 mg- Last filled  [facility]/02/2020 [facility]  Lisinopril-HCTZ 20/25 mg- Last filled 12/02/2020 [facility]. Ozempic 1 mg- Patient assistance Farxiga 10 mg- Patient assistance  [place] Pharmacist Assistant [phone]

## 2021-03-22 ENCOUNTER — Other Ambulatory Visit: Payer: Self-pay | Admitting: Internal Medicine

## 2021-03-22 DIAGNOSIS — E2839 Other primary ovarian failure: Secondary | ICD-10-CM

## 2021-04-06 ENCOUNTER — Telehealth: Payer: Self-pay

## 2021-04-06 NOTE — Chronic Care Management (AMB) (Addendum)
    Chronic Care Management Pharmacy Assistant   Name: CAMRIE STOCK  MRN: [id number] DOB: [birth year]   Reason for Encounter: 2023 PAP   Medications: Outpatient Encounter Medications as of 04/06/2021  Medication Sig   aspirin 81 MG tablet Take 81 mg by mouth daily.   atorvastatin (LIPITOR) 80 MG tablet Take 1 tablet (80 mg total) by mouth daily.   Blood Glucose Monitoring Suppl (ONETOUCH VERIO REFLECT) w/Device KIT Use as directed to check blood sugars 3 times per day dx: e11.65   dapagliflozin propanediol (FARXIGA) 10 MG TABS tablet Take by mouth daily.   fluticasone (FLONASE) 50 MCG/ACT nasal spray SPRAY 2 SPRAYS INTO EACH NOSTRIL EVERY DAY   ibuprofen (ADVIL,MOTRIN) 800 MG tablet Take 1 tablet (800 mg total) by mouth 3 (three) times daily.   insulin degludec (TRESIBA FLEXTOUCH) 200 UNIT/ML FlexTouch Pen Inject 56 Units into the skin at bedtime. (Patient taking differently: Inject 48 Units into the skin at bedtime.)   Insulin Pen Needle (B-D UF III MINI PEN NEEDLES) 31G X 5 MM MISC USE AS DIRECTED WITH LEVEMIR   lisinopril-hydrochlorothiazide (ZESTORETIC) 20-25 MG tablet TAKE 1/2 TABLET BY MOUTH EVERY DAY   Multiple Vitamin (MULTIVITAMIN) tablet Take 1 tablet by mouth daily.   OneTouch Delica Lancets 75F MISC Use as directed to check blood sugars 3 times per day dx: e11.65   ONETOUCH VERIO test strip USE AS DIRECTED TO CHECK BLOOD SUGARS 3 TIMES PER DAY DX: E11.65   OZEMPIC, 1 MG/DOSE, 4 MG/3ML SOPN INJECT 1 MG INTO THE SKIN ONCE A WEEK.   Facility-Administered Encounter Medications as of 04/06/2021  Medication   0.9 %  sodium chloride infusion   dextrose 5 % solution   04-06-2021: Initiated 0104 application for tresiba and Ozempic. Informed patient that application will be mailed to sign and drop off at office with income.  04-15-2021: Informed patient that shingles patient assistance application will be mailed to sign and return.  [place] Pharmacist  Assistant [phone]

## 2021-04-14 ENCOUNTER — Encounter: Payer: Self-pay | Admitting: Internal Medicine

## 2021-04-14 ENCOUNTER — Ambulatory Visit (INDEPENDENT_AMBULATORY_CARE_PROVIDER_SITE_OTHER): Payer: Medicare Other | Admitting: Internal Medicine

## 2021-04-14 ENCOUNTER — Other Ambulatory Visit: Payer: Self-pay

## 2021-04-14 VITALS — BP 122/70 | HR 66 | Temp 98.1°F | Ht 63.5 in | Wt 156.4 lb

## 2021-04-14 DIAGNOSIS — Z794 Long term (current) use of insulin: Secondary | ICD-10-CM | POA: Diagnosis not present

## 2021-04-14 DIAGNOSIS — Z23 Encounter for immunization: Secondary | ICD-10-CM

## 2021-04-14 DIAGNOSIS — I10 Essential (primary) hypertension: Secondary | ICD-10-CM

## 2021-04-14 DIAGNOSIS — E1140 Type 2 diabetes mellitus with diabetic neuropathy, unspecified: Secondary | ICD-10-CM

## 2021-04-14 NOTE — Progress Notes (Signed)
Sylvia Kelly,acting as a Education administrator for Sylvia Greenland, MD.,have documented all relevant documentation on the behalf of Sylvia Greenland, MD,as directed by  Sylvia Greenland, MD while in the presence of Sylvia Greenland, MD.  This visit occurred during the SARS-CoV-2 public health emergency.  Safety protocols were in place, including screening questions prior to the visit, additional usage of staff PPE, and extensive cleaning of exam room while observing appropriate contact time as indicated for disinfecting solutions.  Subjective:     Patient ID: Sylvia Kelly , female    DOB: 1955/09/30 , 65 y.o.   MRN: [id number]   Chief Complaint  Patient presents with   Diabetes   Hypertension    HPI  She presents today for DM/htn follow-up.  She reports compliance with meds. She states her sugars have improved greatly since making some dietary changes.   Diabetes She presents for her follow-up diabetic visit. She has type 2 diabetes mellitus. Her disease course has been improving. There are no hypoglycemic associated symptoms. Pertinent negatives for diabetes include no blurred vision, no chest pain and no fatigue. There are no hypoglycemic complications. Diabetic complications include peripheral neuropathy. Risk factors for coronary artery disease include diabetes mellitus, dyslipidemia, sedentary lifestyle, hypertension and post-menopausal.  Hypertension This is a chronic problem. The current episode started more than 1 year ago. The problem has been gradually improving since onset. Pertinent negatives include no blurred vision, chest pain, palpitations or shortness of breath.    Past Medical History:  Diagnosis Date   Allergy    Arthritis    hip   Atrophic vaginitis    Diabetes mellitus    Elevated cholesterol    Hypertension    Neuromuscular disorder (HCC)    neuropathy hands and feet     Family History  Problem Relation Age of Onset   Heart disease Mother    Diabetes Mother     Cancer Father        Lung cancer   Colon cancer Neg Hx    Colon polyps Neg Hx    Esophageal cancer Neg Hx    Rectal cancer Neg Hx    Stomach cancer Neg Hx      Current Outpatient Medications:    aspirin 81 MG tablet, Take 81 mg by mouth daily., Disp: , Rfl:    atorvastatin (LIPITOR) 80 MG tablet, Take 1 tablet (80 mg total) by mouth daily., Disp: 90 tablet, Rfl: 1   Blood Glucose Monitoring Suppl (ONETOUCH VERIO REFLECT) w/Device KIT, Use as directed to check blood sugars 3 times per day dx: e11.65, Disp: 1 kit, Rfl: 1   dapagliflozin propanediol (FARXIGA) 10 MG TABS tablet, Take by mouth daily., Disp: , Rfl:    fluticasone (FLONASE) 50 MCG/ACT nasal spray, SPRAY 2 SPRAYS INTO EACH NOSTRIL EVERY DAY, Disp: 48 mL, Rfl: 1   ibuprofen (ADVIL,MOTRIN) 800 MG tablet, Take 1 tablet (800 mg total) by mouth 3 (three) times daily., Disp: 21 tablet, Rfl: 0   insulin degludec (TRESIBA FLEXTOUCH) 200 UNIT/ML FlexTouch Pen, Inject 56 Units into the skin at bedtime. (Patient taking differently: Inject 44 Units into the skin at bedtime.), Disp: 27 mL, Rfl: 2   Insulin Pen Needle (B-D UF III MINI PEN NEEDLES) 31G X 5 MM MISC, USE AS DIRECTED WITH LEVEMIR, Disp: 150 each, Rfl: 3   lisinopril-hydrochlorothiazide (ZESTORETIC) 20-25 MG tablet, TAKE 1/2 TABLET BY MOUTH EVERY DAY, Disp: 45 tablet, Rfl: 2   Multiple Vitamin (MULTIVITAMIN) tablet, Take  1 tablet by mouth daily., Disp: , Rfl:    OneTouch Delica Lancets 93J MISC, Use as directed to check blood sugars 3 times per day dx: e11.65, Disp: 150 each, Rfl: 3   ONETOUCH VERIO test strip, USE AS DIRECTED TO CHECK BLOOD SUGARS 3 TIMES PER DAY DX: E11.65, Disp: 200 strip, Rfl: 3   OZEMPIC, 1 MG/DOSE, 4 MG/3ML SOPN, INJECT 1 MG INTO THE SKIN ONCE A WEEK., Disp: 3 mL, Rfl: 2  Current Facility-Administered Medications:    0.9 %  sodium chloride infusion, 500 mL, Intravenous, Continuous, Nandigam, Kavitha V, MD   dextrose 5 % solution, , Intravenous, Continuous,  Nandigam, Kavitha V, MD   No Known Allergies   Review of Systems  Constitutional: Negative.  Negative for fatigue.  Eyes:  Negative for blurred vision.  Respiratory: Negative.  Negative for shortness of breath.   Cardiovascular: Negative.  Negative for chest pain and palpitations.  Gastrointestinal: Negative.   Neurological: Negative.   Psychiatric/Behavioral: Negative.      Today's Vitals   04/14/21 1457  BP: 122/70  Pulse: 66  Temp: 98.1 F (36.7 C)  Weight: 156 lb 6.4 oz (70.9 kg)  Height: 5' 3.5" (1.613 m)  PainSc: 0-No pain   Body mass index is 27.27 kg/m.  Wt Readings from Last 3 Encounters:  04/14/21 156 lb 6.4 oz (70.9 kg)  02/07/21 152 lb (68.9 kg)  01/24/21 152 lb (68.9 kg)     Objective:  Physical Exam Vitals and nursing note reviewed.  Constitutional:      Appearance: Normal appearance.  HENT:     Head: Normocephalic and atraumatic.     Nose:     Comments: Masked     Mouth/Throat:     Comments: Masked  Eyes:     Extraocular Movements: Extraocular movements intact.  Cardiovascular:     Rate and Rhythm: Normal rate and regular rhythm.     Heart sounds: Normal heart sounds.  Pulmonary:     Effort: Pulmonary effort is normal.     Breath sounds: Normal breath sounds.  Musculoskeletal:     Cervical back: Normal range of motion.  Skin:    General: Skin is warm.  Neurological:     General: No focal deficit present.     Mental Status: She is alert.  Psychiatric:        Mood and Affect: Mood normal.        Behavior: Behavior normal.        Assessment And Plan:     1. Type 2 diabetes mellitus with diabetic neuropathy, with long-term current use of insulin (HCC) Comments: Chronic,  She was congratulaed on lifestyle changes. I will check labs as listed below. She will continue to check her sugars 4x/day. will try to get [place] 3 approved. - CMP14+EGFR - Hemoglobin A1c  2. Essential (primary) hypertension Comments: Chronic, well controlled. She is  treated with an Ace(-). NO med changes. Encouraged to follow low sodium diet.   3. Immunization due Comments: She was given high dose flu vaccine.  - Flu Vaccine QUAD High Dose(Fluad)   Patient was given opportunity to ask questions. Patient verbalized understanding of the plan and was able to repeat key elements of the plan. All questions were answered to their satisfaction.   I, Sylvia Greenland, MD, have reviewed all documentation for this visit. The documentation on 04/14/21 for the exam, diagnosis, procedures, and orders are all accurate and complete.   IF YOU HAVE BEEN REFERRED TO  A SPECIALIST, IT MAY TAKE 1-2 WEEKS TO SCHEDULE/PROCESS THE REFERRAL. IF YOU HAVE NOT HEARD FROM US/SPECIALIST IN TWO WEEKS, PLEASE GIVE [place] A CALL AT [phone].   THE PATIENT IS ENCOURAGED TO PRACTICE SOCIAL DISTANCING DUE TO THE COVID-19 PANDEMIC.

## 2021-04-14 NOTE — Patient Instructions (Signed)

## 2021-04-15 LAB — HEMOGLOBIN A1C
Est. average glucose Bld gHb Est-mCnc: 146 mg/dL
Hgb A1c MFr Bld: 6.7 % — ABNORMAL HIGH (ref 4.8–5.6)

## 2021-04-15 LAB — CMP14+EGFR
ALT: 20 IU/L (ref 0–32)
AST: 19 IU/L (ref 0–40)
Albumin/Globulin Ratio: 1.7 (ref 1.2–2.2)
Albumin: 4.1 g/dL (ref 3.8–4.8)
Alkaline Phosphatase: 99 IU/L (ref 44–121)
BUN/Creatinine Ratio: 19 (ref 12–28)
BUN: 16 mg/dL (ref 8–27)
Bilirubin Total: 0.3 mg/dL (ref 0.0–1.2)
CO2: 26 mmol/L (ref 20–29)
Calcium: 8.7 mg/dL (ref 8.7–10.3)
Chloride: 106 mmol/L (ref 96–106)
Creatinine, Ser: 0.84 mg/dL (ref 0.57–1.00)
Globulin, Total: 2.4 g/dL (ref 1.5–4.5)
Glucose: 82 mg/dL (ref 70–99)
Potassium: 4.4 mmol/L (ref 3.5–5.2)
Sodium: 142 mmol/L (ref 134–144)
Total Protein: 6.5 g/dL (ref 6.0–8.5)
eGFR: 77 mL/min/{1.73_m2} (ref >59)

## 2021-04-25 ENCOUNTER — Telehealth: Payer: Self-pay

## 2021-04-25 NOTE — Chronic Care Management (AMB) (Signed)
  Sylvia Kelly was reminded to have all medications, supplements and any blood glucose and blood pressure readings available for review with [name], Pharm. D, at her telephone visit on 04-26-2021 at 12:00.   Questions: Have you had any recent office visit or specialist visit outside of [name]? Patient stated no  Are there any concerns you would like to discuss during your office visit? Patient stated no  Are you having any problems obtaining your medications? (Whether it pharmacy issues or cost) Patient stated no  If patient has any PAP medications ask if they are having any problems getting their PAP medication or refill? Patient stated no  Care Gaps: Shingrix vaccine overdue Yearly Ophthalmology overdue Pap smear overdue Covid booster overdue  Star Rating Drug: Atorvastatin 80 mg- Last filled [facility]-09-2021 [facility]  Lisinopril-HCTZ 20/25 mg- Last filled 03-09-2021 [facility] day [facility]. Ozempic 1 mg- Patient assistance Farxiga 10 mg- Patient assistance  Any gaps in medications fill history? No  [place] Pharmacist Assistant [phone]

## 2021-04-26 ENCOUNTER — Ambulatory Visit (INDEPENDENT_AMBULATORY_CARE_PROVIDER_SITE_OTHER): Payer: Medicare Other

## 2021-04-26 DIAGNOSIS — Z794 Long term (current) use of insulin: Secondary | ICD-10-CM

## 2021-04-26 DIAGNOSIS — I10 Essential (primary) hypertension: Secondary | ICD-10-CM

## 2021-04-26 DIAGNOSIS — E1140 Type 2 diabetes mellitus with diabetic neuropathy, unspecified: Secondary | ICD-10-CM

## 2021-04-26 NOTE — Progress Notes (Addendum)
Chronic Care Management Pharmacy Note  04/27/2021 Name:  Sylvia Kelly MRN:  [id number] DOB:  21-Aug-1955  Summary: Patient reports that she is doing well with taking her medication.   Recommendations/Changes made from today's visit: Recommend patient receive COVID-19 booster.   Plan: Patient to receive COVID-19 booster. Patient will begin to use CGM system to check her BS. Health concierge to follow up with patient on the cost of CGM and the refills for CGM.    Subjective: Sylvia Kelly is an 65 y.o. year old female who is a primary patient of Glendale Chard, MD.  The CCM team was consulted for assistance with disease management and care coordination needs.  She is working Engineer, maintenance (IT) with patient by telephone for follow up visit in response to provider referral for pharmacy case management and/or care coordination services.   Consent to Services:  The patient was given information about Chronic Care Management services, agreed to services, and gave verbal consent prior to initiation of services.  Please see initial visit note for detailed documentation.   Patient Care Team: Glendale Chard, MD as PCP - General (Internal Medicine) Mayford Knife, [facility] (Pharmacist)  Recent office visits: 04/14/2021 PCP OV  Recent consult visits: 02/07/2021 Colonoscopy visit    Hospital visits: None in previous 6 months   Objective:  Lab Results  Component Value Date   CREATININE 0.84 04/14/2021   BUN 16 04/14/2021   GFRNONAA 75 06/09/2020   GFRAA 86 06/09/2020   NA 142 04/14/2021   K 4.4 04/14/2021   CALCIUM 8.7 04/14/2021   CO2 26 04/14/2021   GLUCOSE 82 04/14/2021    Lab Results  Component Value Date/Time   HGBA1C 6.7 (H) 04/14/2021 03:55 PM   HGBA1C 6.7 (H) 01/19/2021 12:22 PM   HGBA1C 7.7 01/16/2018 12:00 AM   MICROALBUR 30 11/10/2020 12:48 PM   MICROALBUR 30 11/04/2019 11:00 AM    Last diabetic Eye exam:  Lab Results  Component Value Date/Time    HMDIABEYEEXA Retinopathy (A) 08/08/2019 12:00 AM    Last diabetic Foot exam: No results found for: HMDIABFOOTEX   Lab Results  Component Value Date   CHOL 178 06/09/2020   HDL 61 06/09/2020   LDLCALC 94 06/09/2020   TRIG 129 06/09/2020   CHOLHDL 2.9 06/09/2020    Hepatic Function Latest Ref Rng & Units 04/14/2021 09/06/2020 06/09/2020  Total Protein 6.0 - 8.5 g/dL 6.5 6.7 6.6  Albumin 3.8 - 4.8 g/dL 4.1 4.2 4.0  AST 0 - 40 IU/L 19 20 31   ALT 0 - 32 IU/L 20 20 50(H)  Alk Phosphatase 44 - 121 IU/L 99 104 127(H)  Total Bilirubin 0.0 - 1.2 mg/dL 0.3 0.4 0.5  Bilirubin, Direct 0.00 - 0.40 mg/dL - 0.11 -    Lab Results  Component Value Date/Time   TSH 0.60 10/10/2017 12:00 AM    CBC Latest Ref Rng & Units 11/04/2019 10/30/2018  WBC 3.4 - 10.8 x10E3/uL 3.6 4.0  Hemoglobin 11.1 - 15.9 g/dL 12.1 12.3  Hematocrit 34.0 - 46.6 % 36.8 36.5  Platelets 150 - 450 x10E3/uL 285 288    No results found for: VD25OH  Clinical ASCVD: No  The 10-year ASCVD risk score (Arnett DK, et al., 2019) is: 16.4%   Values used to calculate the score:     Age: 65 years     Sex: Female     Is Non-Hispanic African American: Yes     Diabetic: Yes  Tobacco smoker: No     Systolic Blood Pressure: 035 mmHg     Is BP treated: Yes     HDL Cholesterol: 61 mg/dL     Total Cholesterol: 178 mg/dL    Depression screen [facility] 2/9 11/10/2020 11/04/2019 02/06/2019  Decreased Interest 0 0 0  Down, Depressed, Hopeless 0 0 0  PHQ - 2 Score 0 0 0     Social History   Tobacco Use  Smoking Status Never  Smokeless Tobacco Never   BP Readings from Last 3 Encounters:  04/14/21 122/70  02/07/21 106/66  01/19/21 116/70   Pulse Readings from Last 3 Encounters:  04/14/21 66  02/07/21 74  01/19/21 74   Wt Readings from Last 3 Encounters:  04/14/21 156 lb 6.4 oz (70.9 kg)  02/07/21 152 lb (68.9 kg)  01/24/21 152 lb (68.9 kg)   BMI Readings from Last 3 Encounters:  04/14/21 27.27 kg/m  02/07/21 26.50 kg/m   01/24/21 26.50 kg/m    Assessment/Interventions: Review of patient past medical history, allergies, medications, health status, including review of consultants reports, laboratory and other test data, was performed as part of comprehensive evaluation and provision of chronic care management services.   SDOH:  (Social Determinants of Health) assessments and interventions performed: No  SDOH Screenings   Alcohol Screen: Not on file  Depression (PHQ2-9): Low Risk    PHQ-2 Score: 0  Financial Resource Strain: Not on file  Food Insecurity: Not on file  Housing: Not on file  Physical Activity: Not on file  Social Connections: Not on file  Stress: Not on file  Tobacco Use: Low Risk    Smoking Tobacco Use: Never   Smokeless Tobacco Use: Never   Passive Exposure: Not on file  Transportation Needs: Not on file    [place]  No Known Allergies  Medications Reviewed Today     Reviewed by Glendale Chard, MD (Physician) on 04/14/21 at 1532  Med List Status: <None>   Medication Order Taking? Sig Documenting Provider Last Dose Status Informant  0.9 %  sodium chloride infusion [id number]   Mauri Pole, MD  Active   aspirin 81 MG tablet [id number] Yes Take 81 mg by mouth daily. [provider] Taking Active   atorvastatin (LIPITOR) 80 MG tablet [id number] Yes Take 1 tablet (80 mg total) by mouth daily. Glendale Chard, MD Taking Active   Blood Glucose Monitoring Suppl [facility] VERIO REFLECT) w/Device Drucie Opitz [id number] Yes Use as directed to check blood sugars 3 times per day dx: e11.65 Glendale Chard, MD Taking Active   dapagliflozin propanediol (FARXIGA) 10 MG TABS tablet [id number] Yes Take by mouth daily. [provider] Taking Active   dextrose 5 % solution [id number]   Mauri Pole, MD  Active   fluticasone (FLONASE) 50 MCG/ACT nasal spray [id number] Yes SPRAY 2 SPRAYS INTO EACH NOSTRIL EVERY DAY Minette Brine, FNP Taking Active   ibuprofen (ADVIL,MOTRIN) 800  MG tablet [id number] Yes Take 1 tablet (800 mg total) by mouth 3 (three) times daily. Domenic Moras, PA-C Taking Active   insulin degludec (TRESIBA FLEXTOUCH) 200 UNIT/ML FlexTouch Pen [id number] Yes Inject 56 Units into the skin at bedtime.  Patient taking differently: Inject 44 Units into the skin at bedtime.   Bary Castilla, NP Taking Active   Insulin Pen Needle (B-D UF III MINI PEN NEEDLES) 31G X 5 MM MISC [id number] Yes USE AS DIRECTED WITH Leodis Liverpool, MD Taking Active   lisinopril-hydrochlorothiazide (ZESTORETIC) 20-25 MG tablet [id number]  Yes TAKE 1/2 TABLET BY MOUTH EVERY DAY Ghumman, Ramandeep, NP Taking Active   Multiple Vitamin (MULTIVITAMIN) tablet [id number] Yes Take 1 tablet by mouth daily. [provider] Taking Active   OneTouch Delica Lancets 15B MISC [id number] Yes Use as directed to check blood sugars 3 times per day dx: e11.65 Glendale Chard, MD Taking Active   [facility] VERIO test strip [id number] Yes USE AS DIRECTED TO CHECK BLOOD SUGARS 3 TIMES PER DAY DX: E11.65 Glendale Chard, MD Taking Active   OZEMPIC, 1 MG/DOSE, 4 MG/3ML SOPN [id number] Yes INJECT 1 MG INTO THE SKIN ONCE A WEEK. Bary Castilla, NP Taking Active             Patient Active Problem List   Diagnosis Date Noted   Multinodular goiter 11/12/2020   Paresthesia 11/12/2020   Female climacteric state 01/04/2017   Other long term (current) drug therapy 01/04/2017   Essential (primary) hypertension    Type 2 diabetes mellitus with diabetic neuropathy, unspecified (HCC)    Elevated cholesterol     Immunization History  Administered Date(s) Administered   DTaP 09/09/2013   Fluad Quad(high Dose 65+) 04/14/2021   Influenza Inj Mdck Quad Pf 03/20/2019   Influenza,inj,Quad PF,6+ Mos 04/04/2012, 02/26/2017, 03/21/2018, 03/20/2019, 04/09/2020   Influenza-Unspecified 03/21/2018   Moderna Sars-Covid-2 Vaccination 07/08/2019, 08/05/2019, 03/26/2020, 01/10/2021   Pneumococcal Conjugate-13  05/21/2018   Pneumococcal Polysaccharide-23 05/08/2019   Tdap 05/29/2012   Zoster Recombinat (Shingrix) 11/12/2020    Conditions to be addressed/monitored:  Hypertension and Diabetes  Care Plan : [place]  Updates made by Mayford Knife, [place] since 04/27/2021 12:00 AM     Problem: HTN, DM II      Long-Range Goal: Disease Management   Recent Progress: On track  Note:     Current Barriers:  Unable to independently afford treatment regimen  Pharmacist Clinical Goal(s):  Patient will achieve adherence to monitoring guidelines and medication adherence to achieve therapeutic efficacy through collaboration with PharmD and provider.   Interventions: 1:1 collaboration with Glendale Chard, MD regarding development and update of comprehensive plan of care as evidenced by provider attestation and co-signature Inter-disciplinary care team collaboration (see longitudinal plan of care) Comprehensive medication review performed; medication list updated in electronic medical record  Hypertension (BP goal <130/80) -Controlled -Current treatment: Lisinopril - Hydrochlorothiazide 20-25 mg taking 1 tablet by mouth daily. -Current home readings: will discuss further with patient during next visit when she is at home  -Current dietary habits: she is limiting the amount of salt she is eating.  -Denies hypotensive/hypertensive symptoms -Educated on BP goals and benefits of medications for prevention of heart attack, stroke and kidney damage; -Counseled to monitor BP at home at, document, and provide log at future appointments -Recommended to continue current medication  Diabetes (A1c goal <7%) -Controlled -Current medications: Tresiba - inject 44 units at bedtime  Ozempic 1 mg inject once per week on Saturday night  Farxiga 10 mg tablet once per day  -Current home glucose readings fasting glucose: 56, 67, high 70's  -Denies hypoglycemic/hyperglycemic symptoms -Current meal  patterns: she reports that she is not hungry and eating very small meals. She is eating avocado with sweet potatoes. drinks: plenty of water, she has increased her water to 1 in the morning, 1 during the day, and 1 in the evening at least.  -Current exercise: will discuss further during next office visit.  -Educated on A1c and blood sugar goals; Complications of diabetes including kidney damage, retinal damage,  and cardiovascular disease; Continuous glucose monitoring; -Will follow up to determine possible cost of freestyle libre.  -Counseled to check feet daily and get yearly eye exams -Recommended to continue current medication  Patient Goals/Self-Care Activities Patient will:  - take medications as prescribed as evidenced by patient report and record review  Follow Up Plan: The patient has been provided with contact information for the care management team and has been advised to call with any health related questions or concerns.        Medication Assistance:  Franne Grip obtained through   medication assistance program.  Enrollment ends 04/2021  Compliance/Adherence/Medication fill history: Care Gaps: COVID-19 Booster: 3rd Booster 4th Thursday at Dr. Baird [facility] office  Ophthalmology Exam - [facility] - May [facility]22 PAP Smear-Modifier - we discussed the importance of the exam and she reported that she will set up her appointment. DEXA Scan Shingrix Vaccine   Star-Rating Drugs: Atorvastatin 80 mg tablet  Farxiga 10 mg tablet  Lisinopril -HCTZ 20-25 mg tablet Ozempic 1 mg    Patient's preferred pharmacy is:  [id number] [place] [place] - [id number] [place]Phone: [phone]: [phone] [place] [address]Phone: [phone]: [phone] Uses pill  box? Yes Pt endorses 95% compliance  We discussed: Benefits of medication synchronization, packaging and delivery as well as enhanced pharmacist oversight with [employer]. Patient decided to: Continue current medication management strategy  Care Plan and Follow Up Patient Decision:  Patient agrees to Care Plan and Follow-up.  Plan: The patient has been provided with contact information for the care management team and has been advised to call with any health related questions or concerns.   VOrlando Penner CPP, PharmD Clinical Pharmacist Practitioner [facility] [phone]

## 2021-04-27 DIAGNOSIS — E1140 Type 2 diabetes mellitus with diabetic neuropathy, unspecified: Secondary | ICD-10-CM | POA: Diagnosis not present

## 2021-04-27 DIAGNOSIS — I10 Essential (primary) hypertension: Secondary | ICD-10-CM | POA: Diagnosis not present

## 2021-04-27 DIAGNOSIS — Z794 Long term (current) use of insulin: Secondary | ICD-10-CM

## 2021-04-27 NOTE — Patient Instructions (Addendum)
Visit Information It was great speaking with you today!  Please let me know if you have any questions about our visit.   Goals Addressed             This Visit's Progress    Manage My Medicine       Timeframe:  Long-Range Goal Priority:  High Start Date:                             Expected End Date:                       Follow Up Date 10/01/2021  In Progress:   - call for medicine refill 2 or 3 days before it runs out - call if I am sick and can't take my medicine - keep a list of all the medicines I take; vitamins and herbals too    Why is this important?   These steps will help you keep on track with your medicines.   Notes:  Please call with any questions or concerns, and fill out patient assistance paperwork that was mailed to you for next year.         Patient Care Plan: CCM Pharmacy Care Plan     Problem Identified: HTN, DM II      Long-Range Goal: Disease Management   Recent Progress: On track  Note:     Current Barriers:  Unable to independently afford treatment regimen  Pharmacist Clinical Goal(s):  Patient will achieve adherence to monitoring guidelines and medication adherence to achieve therapeutic efficacy through collaboration with PharmD and provider.   Interventions: 1:1 collaboration with Glendale Chard, MD regarding development and update of comprehensive plan of care as evidenced by provider attestation and co-signature Inter-disciplinary care team collaboration (see longitudinal plan of care) Comprehensive medication review performed; medication list updated in electronic medical record  Hypertension (BP goal <130/80) -Controlled -Current treatment: Lisinopril - Hydrochlorothiazide 20-25 mg taking 1 tablet by mouth daily. -Current home readings: will discuss further with patient during next visit when she is at home  -Current dietary habits: she is limiting the amount of salt she is eating.  -Denies hypotensive/hypertensive  symptoms -Educated on BP goals and benefits of medications for prevention of heart attack, stroke and kidney damage; -Counseled to monitor BP at home at, document, and provide log at future appointments -Recommended to continue current medication  Diabetes (A1c goal <7%) -Controlled -Current medications: Tresiba - inject 44 units at bedtime  Ozempic 1 mg inject once per week on Saturday night  Farxiga 10 mg tablet once per day  -Current home glucose readings fasting glucose: 56, 67, high 70's  -Denies hypoglycemic/hyperglycemic symptoms -Current meal patterns: she reports that she is not hungry and eating very small meals. She is eating avocado with sweet potatoes. drinks: plenty of water, she has increased her water to 1 in the morning, 1 during the day, and 1 in the evening at least.  -Current exercise: will discuss further during next office visit.  -Educated on A1c and blood sugar goals; Complications of diabetes including kidney damage, retinal damage, and cardiovascular disease; Continuous glucose monitoring; -Will follow up to determine possible cost of freestyle libre.  -Counseled to check feet daily and get yearly eye exams -Recommended to continue current medication  Patient Goals/Self-Care Activities Patient will:  - take medications as prescribed as evidenced by patient report and record review  Follow Up Plan: The  patient has been provided with contact information for the care management team and has been advised to call with any health related questions or concerns.        Patient agreed to services and verbal consent obtained.   The patient verbalized understanding of instructions, educational materials, and care plan provided today and agreed to receive a mailed copy of patient instructions, educational materials, and care plan.   [name], PharmD Clinical Pharmacist [facility] [phone]

## 2021-05-18 ENCOUNTER — Other Ambulatory Visit (HOSPITAL_COMMUNITY)
Admission: RE | Admit: 2021-05-18 | Discharge: 2021-05-18 | Disposition: A | Discharge status: Home / Self Care | Payer: Medicare Other | Source: Ambulatory Visit | Attending: Obstetrics and Gynecology | Admitting: Obstetrics and Gynecology

## 2021-05-18 ENCOUNTER — Encounter: Payer: Self-pay | Admitting: Obstetrics and Gynecology

## 2021-05-18 ENCOUNTER — Ambulatory Visit (INDEPENDENT_AMBULATORY_CARE_PROVIDER_SITE_OTHER): Payer: Medicare Other | Admitting: Obstetrics and Gynecology

## 2021-05-18 ENCOUNTER — Other Ambulatory Visit: Payer: Self-pay

## 2021-05-18 VITALS — BP 93/59 | HR 97 | Ht 63.0 in | Wt 152.0 lb

## 2021-05-18 DIAGNOSIS — N898 Other specified noninflammatory disorders of vagina: Secondary | ICD-10-CM | POA: Diagnosis not present

## 2021-05-18 DIAGNOSIS — Z01419 Encounter for gynecological examination (general) (routine) without abnormal findings: Secondary | ICD-10-CM

## 2021-05-18 NOTE — Progress Notes (Signed)
Thinks she has a yeast infection

## 2021-05-20 LAB — CERVICOVAGINAL ANCILLARY ONLY
Bacterial Vaginitis (gardnerella): NEGATIVE
Candida Glabrata: NEGATIVE
Candida Vaginitis: POSITIVE — AB
Comment: NEGATIVE
Comment: NEGATIVE
Comment: NEGATIVE

## 2021-05-20 NOTE — Progress Notes (Signed)
Obstetrics and Gynecology Annual Patient Evaluation  Appointment Date: 05/18/2021  OBGYN Clinic: [facility]   Primary Care Provider: Glendale Chard  Referring Provider: Glendale Chard, MD  Chief Complaint:  Chief Complaint  Patient presents with   Gynecologic Exam    History of Present Illness: Sylvia Kelly is a 65 y.o. G2P1011 (No LMP recorded. Patient is postmenopausal.), seen for the above chief complaint.  Patient having d/c and itching and believes she has a yeast infection  Review of Systems: Pertinent items noted in HPI and remainder of comprehensive ROS otherwise negative.    Patient Active Problem List   Diagnosis Date Noted   Multinodular goiter 11/12/2020   Paresthesia 11/12/2020   Female climacteric state 01/04/2017   Other long term (current) drug therapy 01/04/2017   Essential (primary) hypertension    Type 2 diabetes mellitus with diabetic neuropathy, unspecified (HCC)    Elevated cholesterol     Past Medical History:  Past Medical History:  Diagnosis Date   Allergy    Arthritis    hip   Atrophic vaginitis    Diabetes mellitus    Elevated cholesterol    Hypertension    Neuromuscular disorder (HCC)    neuropathy hands and feet    Past Surgical History:  Past Surgical History:  Procedure Laterality Date   COLONOSCOPY     10 yrs ago- [employer] - normal per pt   OVARIAN CYST SURGERY  05/29/1990   REFRACTIVE SURGERY     TUBAL LIGATION      Past Obstetrical History:  OB History  Gravida Para Term Preterm AB Living  2 1 1   1 1   SAB IAB Ectopic Multiple Live Births               # Outcome Date GA Lbr Len/2nd Weight Sex Delivery Anes PTL Lv  2 AB           1 Term             Past Gynecological History: As per HPI. History of HRT use: No.  Social History:  Social History   Socioeconomic History   Marital status: Married    Spouse name: Not on file   Number of children: Not on file   Years of  education: Not on file   Highest education level: Not on file  Occupational History   Not on file  Tobacco Use   Smoking status: Never   Smokeless tobacco: Never  Vaping Use   Vaping Use: Never used  Substance and Sexual Activity   Alcohol use: No   Drug use: No   Sexual activity: Yes    Birth control/protection: Surgical  Other Topics Concern   Not on file  Social History Narrative   Not on file   Social Determinants of Health   Financial Resource Strain: Not on file  Food Insecurity: Not on file  Transportation Needs: Not on file  Physical Activity: Not on file  Stress: Not on file  Social Connections: Not on file  Intimate Partner Violence: Not on file    Family History:  Family History  Problem Relation Age of Onset   Heart disease Mother    Diabetes Mother    Cancer Father        Lung cancer   Colon cancer Neg Hx    Colon polyps Neg Hx    Esophageal cancer Neg Hx    Rectal cancer Neg Hx    Stomach  cancer Neg Hx     Health Maintenance:  Mammogram(s): Yes.   Date: 01/2021 Colonoscopy: Yes.   Date: 01/2021  Medications Lestine Box had no medications administered during this visit. Current Outpatient Medications  Medication Sig Dispense Refill   aspirin 81 MG tablet Take 81 mg by mouth daily.     atorvastatin (LIPITOR) 80 MG tablet Take 1 tablet (80 mg total) by mouth daily. 90 tablet 1   Blood Glucose Monitoring Suppl (ONETOUCH VERIO REFLECT) w/Device KIT Use as directed to check blood sugars 3 times per day dx: e11.65 1 kit 1   dapagliflozin propanediol (FARXIGA) 10 MG TABS tablet Take by mouth daily.     fluticasone (FLONASE) 50 MCG/ACT nasal spray SPRAY 2 SPRAYS INTO EACH NOSTRIL EVERY DAY 48 mL 1   ibuprofen (ADVIL,MOTRIN) 800 MG tablet Take 1 tablet (800 mg total) by mouth 3 (three) times daily. 21 tablet 0   insulin degludec (TRESIBA FLEXTOUCH) 200 UNIT/ML FlexTouch Pen Inject 56 Units into the skin at bedtime. (Patient taking differently: Inject  44 Units into the skin at bedtime.) 27 mL 2   Insulin Pen Needle (B-D UF III MINI PEN NEEDLES) 31G X 5 MM MISC USE AS DIRECTED WITH LEVEMIR 150 each 3   lisinopril-hydrochlorothiazide (ZESTORETIC) 20-25 MG tablet TAKE 1/2 TABLET BY MOUTH EVERY DAY 45 tablet 2   Multiple Vitamin (MULTIVITAMIN) tablet Take 1 tablet by mouth daily.     OneTouch Delica Lancets 69V MISC Use as directed to check blood sugars 3 times per day dx: e11.65 150 each 3   ONETOUCH VERIO test strip USE AS DIRECTED TO CHECK BLOOD SUGARS 3 TIMES PER DAY DX: E11.65 200 strip 3   OZEMPIC, 1 MG/DOSE, 4 MG/3ML SOPN INJECT 1 MG INTO THE SKIN ONCE A WEEK. 3 mL 2    Allergies Patient has no known allergies.   Physical Exam:  BP (!) 93/59    Pulse 97    Ht 5' 3"  (1.6 m)    Wt 152 lb (68.9 kg)    BMI 26.93 kg/m  Body mass index is 26.93 kg/m. General appearance: Well nourished, well developed female in no acute distress.  Neck:  Supple, normal appearance, and no thyromegaly  Cardiovascular: normal s1 and s2.  No murmurs, rubs or gallops. Respiratory:  Clear to auscultation bilateral. Normal respiratory effort Abdomen: positive bowel sounds and no masses, hernias; diffusely non tender to palpation, non distended Breasts: breasts appear normal, no suspicious masses, no skin or nipple changes or axillary nodes, and negative palpation. Neuro/Psych:  Normal mood and affect.  Skin:  Warm and dry.  Lymphatic:  No inguinal lymphadenopathy.   Pelvic exam: is not limited by body habitus EGBUS: within normal limits Vagina: no blood. +white d/c Cervix: normal appearing cervix without tenderness, discharge or lesions. Uterus:  nonenlarged and non tender Adnexa:  normal adnexa and no mass, fullness, tenderness Rectovaginal: deferred  Laboratory: none  Radiology: none  Assessment: patient doing well  Plan:  1. Vaginal discharge - Cervicovaginal ancillary only( [place])  2. Vagina itching - Cervicovaginal ancillary only(  [place])  3. GYN Routine care   RTC PRN  Durene Romans MD Attending Center for [facility])

## 2021-05-24 MED ORDER — FLUCONAZOLE 150 MG PO TABS: 150.0000 mg | ORAL_TABLET | Freq: Once | ORAL | 1 refills | Status: AC

## 2021-05-24 NOTE — Addendum Note (Signed)
Addended by: Aletha Halim on: 05/24/2021 12:18 PM   Modules accepted: Orders

## 2021-05-31 ENCOUNTER — Telehealth: Payer: Self-pay

## 2021-05-31 NOTE — Telephone Encounter (Signed)
Patient notified her Sylvia Kelly is ready for pickup from the [employer] pt assistance program.

## 2021-06-04 ENCOUNTER — Other Ambulatory Visit: Payer: Self-pay | Admitting: Nurse Practitioner

## 2021-06-07 ENCOUNTER — Ambulatory Visit (INDEPENDENT_AMBULATORY_CARE_PROVIDER_SITE_OTHER): Payer: Medicare Other

## 2021-06-07 ENCOUNTER — Other Ambulatory Visit: Payer: Self-pay

## 2021-06-07 VITALS — BP 112/70 | HR 95 | Temp 97.6°F | Ht 63.0 in | Wt 148.6 lb

## 2021-06-07 DIAGNOSIS — Z23 Encounter for immunization: Secondary | ICD-10-CM

## 2021-06-07 NOTE — Progress Notes (Signed)
The patient is here for her 2nd Shingrix vaccination.

## 2021-06-13 ENCOUNTER — Ambulatory Visit: Payer: Federal, State, Local not specified - PPO | Admitting: Internal Medicine

## 2021-06-17 ENCOUNTER — Other Ambulatory Visit: Payer: Self-pay

## 2021-06-17 ENCOUNTER — Ambulatory Visit (INDEPENDENT_AMBULATORY_CARE_PROVIDER_SITE_OTHER): Payer: Medicare Other | Admitting: Internal Medicine

## 2021-06-17 ENCOUNTER — Encounter: Payer: Self-pay | Admitting: Internal Medicine

## 2021-06-17 VITALS — BP 114/70 | HR 88 | Temp 98.4°F | Ht 64.0 in | Wt 148.8 lb

## 2021-06-17 DIAGNOSIS — E1140 Type 2 diabetes mellitus with diabetic neuropathy, unspecified: Secondary | ICD-10-CM | POA: Diagnosis not present

## 2021-06-17 DIAGNOSIS — E559 Vitamin D deficiency, unspecified: Secondary | ICD-10-CM

## 2021-06-17 DIAGNOSIS — R202 Paresthesia of skin: Secondary | ICD-10-CM

## 2021-06-17 DIAGNOSIS — Z0000 Encounter for general adult medical examination without abnormal findings: Secondary | ICD-10-CM | POA: Diagnosis not present

## 2021-06-17 DIAGNOSIS — E7800 Pure hypercholesterolemia, unspecified: Secondary | ICD-10-CM

## 2021-06-17 DIAGNOSIS — M79609 Pain in unspecified limb: Secondary | ICD-10-CM

## 2021-06-17 DIAGNOSIS — I10 Essential (primary) hypertension: Secondary | ICD-10-CM

## 2021-06-17 DIAGNOSIS — K5140 Inflammatory polyps of colon without complications: Secondary | ICD-10-CM | POA: Insufficient documentation

## 2021-06-17 DIAGNOSIS — Z794 Long term (current) use of insulin: Secondary | ICD-10-CM

## 2021-06-17 DIAGNOSIS — E042 Nontoxic multinodular goiter: Secondary | ICD-10-CM

## 2021-06-17 DIAGNOSIS — E1100 Type 2 diabetes mellitus with hyperosmolarity without nonketotic hyperglycemic-hyperosmolar coma (NKHHC): Secondary | ICD-10-CM

## 2021-06-17 DIAGNOSIS — Z6825 Body mass index (BMI) 25.0-25.9, adult: Secondary | ICD-10-CM

## 2021-06-17 LAB — POCT URINALYSIS DIPSTICK
Bilirubin, UA: NEGATIVE
Glucose, UA: NEGATIVE
Ketones, UA: NEGATIVE
Leukocytes, UA: NEGATIVE
Nitrite, UA: NEGATIVE
Protein, UA: NEGATIVE
Spec Grav, UA: 1.01 (ref 1.010–1.025)
Urobilinogen, UA: 0.2 E.U./dL
pH, UA: 5 (ref 5.0–8.0)

## 2021-06-17 NOTE — Progress Notes (Signed)
Subjective:    Sylvia Kelly is a 66 y.o. female who presents for a Welcome to Medicare exam. Patient is followed by GYN, Last pap was December 2022 with Aletha Halim. She reports compliance with meds. She denies having any concerns or complaints at this time.   Diabetes She presents for her follow-up diabetic visit. She has type 2 diabetes mellitus. Her disease course has been fluctuating. There are no hypoglycemic associated symptoms. Pertinent negatives for diabetes include no blurred vision and no chest pain. There are no hypoglycemic complications. Diabetic complications include peripheral neuropathy. Risk factors for coronary artery disease include dyslipidemia, diabetes mellitus, hypertension, post-menopausal and sedentary lifestyle. Her breakfast blood glucose is taken between 9-10 am. Her breakfast blood glucose range is generally 140-180 mg/dl.  Hypertension This is a chronic problem. The current episode started more than 1 year ago. The problem has been gradually improving since onset. The problem is controlled. Pertinent negatives include no blurred vision, chest pain, palpitations or shortness of breath.    Review of Systems Review of Systems  Constitutional: Negative.   HENT: Negative.    Eyes: Negative.  Negative for blurred vision.  Respiratory: Negative.  Negative for shortness of breath.   Cardiovascular: Negative.  Negative for chest pain and palpitations.  Gastrointestinal: Negative.   Genitourinary: Negative.   Musculoskeletal: Negative.   Skin: Negative.   Neurological:  Positive for tingling.  Endo/Heme/Allergies: Negative.   Psychiatric/Behavioral: Negative.     Cardiac Risk Factors include: advanced age (>43mn, >>69women);diabetes mellitus;dyslipidemia;hypertension;family history of premature cardiovascular disease      Objective:    Today's Vitals   06/17/21 1524  BP: 114/70  Pulse: 88  Temp: 98.4 F (36.9 C)  Weight: 148 lb 12.8 oz (67.5 kg)   Height: 5' 4"  (1.626 m)  PainSc: 0-No pain  Body mass index is 25.54 kg/m. Wt Readings from Last 3 Encounters:  06/17/21 148 lb 12.8 oz (67.5 kg)  06/07/21 148 lb 9.6 oz (67.4 kg)  05/18/21 152 lb (68.9 kg)   BP Readings from Last 3 Encounters:  06/17/21 114/70  06/07/21 112/70  05/18/21 (!) 93/59    Medications Outpatient Encounter Medications as of 06/17/2021  Medication Sig   aspirin 81 MG tablet Take 81 mg by mouth daily.   atorvastatin (LIPITOR) 80 MG tablet Take 1 tablet (80 mg total) by mouth daily.   Blood Glucose Monitoring Suppl (ONETOUCH VERIO REFLECT) w/Device KIT Use as directed to check blood sugars 3 times per day dx: e11.65   dapagliflozin propanediol (FARXIGA) 10 MG TABS tablet Take by mouth daily.   fluticasone (FLONASE) 50 MCG/ACT nasal spray SPRAY 2 SPRAYS INTO EACH NOSTRIL EVERY DAY   insulin degludec (TRESIBA FLEXTOUCH) 200 UNIT/ML FlexTouch Pen Inject 56 Units into the skin at bedtime. (Patient taking differently: Inject 40 Units into the skin at bedtime.)   Insulin Pen Needle (B-D UF III MINI PEN NEEDLES) 31G X 5 MM MISC USE AS DIRECTED WITH LEVEMIR   lisinopril-hydrochlorothiazide (ZESTORETIC) 20-25 MG tablet TAKE 1/2 TABLET BY MOUTH EVERY DAY   Multiple Vitamin (MULTIVITAMIN) tablet Take 1 tablet by mouth daily.   OneTouch Delica Lancets 356OMISC Use as directed to check blood sugars 3 times per day dx: e11.65   ONETOUCH VERIO test strip USE AS DIRECTED TO CHECK BLOOD SUGARS 3 TIMES PER DAY DX: E11.65   OZEMPIC, 1 MG/DOSE, 4 MG/3ML SOPN INJECT 1 MG INTO THE SKIN ONCE A WEEK.   ibuprofen (ADVIL,MOTRIN) 800 MG tablet Take  1 tablet (800 mg total) by mouth 3 (three) times daily. (Patient not taking: Reported on 06/17/2021)   Facility-Administered Encounter Medications as of 06/17/2021  Medication   0.9 %  sodium chloride infusion   dextrose 5 % solution     History: Past Medical History:  Diagnosis Date   Allergy    Arthritis    hip   Atrophic vaginitis     Diabetes mellitus    Elevated cholesterol    Hypertension    Neuromuscular disorder (HCC)    neuropathy hands and feet   Past Surgical History:  Procedure Laterality Date   COLONOSCOPY     10 yrs ago- [employer] - normal per pt   OVARIAN CYST SURGERY  05/29/1990   REFRACTIVE SURGERY     TUBAL LIGATION      Family History  Problem Relation Age of Onset   Heart disease Mother    Diabetes Mother    Cancer Father        Lung cancer   Colon cancer Neg Hx    Colon polyps Neg Hx    Esophageal cancer Neg Hx    Rectal cancer Neg Hx    Stomach cancer Neg Hx    Social History   Occupational History   Not on file  Tobacco Use   Smoking status: Never   Smokeless tobacco: Never  Vaping Use   Vaping Use: Never used  Substance and Sexual Activity   Alcohol use: No   Drug use: No   Sexual activity: Yes    Birth control/protection: Surgical    Tobacco Counseling Counseling given: Not Answered   Immunizations and Health Maintenance Immunization History  Administered Date(s) Administered   DTaP 09/09/2013   Fluad Quad(high Dose 65+) 04/14/2021   Influenza Inj Mdck Quad Pf 03/20/2019   Influenza,inj,Quad PF,6+ Mos 04/04/2012, 02/26/2017, 03/21/2018, 03/20/2019, 04/09/2020   Influenza-Unspecified 03/21/2018   Moderna Sars-Covid-2 Vaccination 07/08/2019, 08/05/2019, 03/26/2020, 01/10/2021   Pneumococcal Conjugate-13 05/21/2018   Pneumococcal Polysaccharide-23 05/08/2019   Tdap 05/29/2012   Zoster Recombinat (Shingrix) 11/12/2020, 06/07/2021   Health Maintenance Due  Topic Date Due   OPHTHALMOLOGY EXAM  08/07/2020   DEXA SCAN  12/23/2020   COVID-19 Vaccine (5 - Booster for Moderna series) 03/07/2021    Activities of Daily Living In your present state of health, do you have any difficulty performing the following activities: 06/17/2021 01/19/2021  Hearing? N N  Vision? N N  Difficulty concentrating or making decisions? N N  Walking or climbing stairs? N N  Dressing  or bathing? N N  Doing errands, shopping? N N  Preparing Food and eating ? N -  Using the Toilet? N -  In the past six months, have you accidently leaked urine? N -  Do you have problems with loss of bowel control? N -  Managing your Medications? N -  Managing your Finances? N -  Housekeeping or managing your Housekeeping? N -  Some recent data might be hidden    Physical Exam Vitals and nursing note reviewed.  Constitutional:      Appearance: Normal appearance.  HENT:     Head: Normocephalic and atraumatic.     Right Ear: Tympanic membrane, ear canal and external ear normal.     Left Ear: Tympanic membrane, ear canal and external ear normal.     Nose:     Comments: Masked     Mouth/Throat:     Comments: Masked  Eyes:     Extraocular Movements:  Extraocular movements intact.     Conjunctiva/sclera: Conjunctivae normal.     Pupils: Pupils are equal, round, and reactive to light.  Neck:     Thyroid: Thyromegaly present.  Cardiovascular:     Rate and Rhythm: Normal rate and regular rhythm.     Pulses: Normal pulses.          Dorsalis pedis pulses are 2+ on the right side and 2+ on the left side.     Heart sounds: Normal heart sounds.  Pulmonary:     Effort: Pulmonary effort is normal.     Breath sounds: Normal breath sounds.  Chest:  Breasts:    Tanner Score is 5.     Right: Normal.     Left: Normal.  Abdominal:     General: Bowel sounds are normal.     Palpations: Abdomen is soft.  Musculoskeletal:        General: Normal range of motion.     Cervical back: Normal range of motion.  Feet:     Right foot:     Protective Sensation: 5 sites tested.  5 sites sensed.     Skin integrity: Skin integrity normal.     Toenail Condition: Right toenails are normal.     Left foot:     Protective Sensation: 5 sites tested.  5 sites sensed.     Skin integrity: Skin integrity normal.     Toenail Condition: Left toenails are normal.  Skin:    General: Skin is warm and dry.   Neurological:     General: No focal deficit present.     Mental Status: She is alert and oriented to person, place, and time.  Psychiatric:        Mood and Affect: Mood normal.        Behavior: Behavior normal.    (optional), or other factors deemed appropriate based on the beneficiary's medical and social history and current clinical standards.  Advanced Directives: Does Patient Have a Medical Advance Directive?: No Would patient like information on creating a medical advance directive?: Yes (ED - Information included in AVS)    Assessment:    This is a routine wellness examination for this patient .   Vision/Hearing screen Hearing Screening   250Hz  500Hz  1000Hz  2000Hz  4000Hz   Right ear 25 25 25 25  Fail  Left ear 25 25 25 25 25    Vision Screening   Right eye Left eye Both eyes  Without correction     With correction 20/30 20/30 20/30     Dietary issues and exercise activities discussed:  Current Exercise Habits: Home exercise routine, Type of exercise: walking, Time (Minutes): 15, Frequency (Times/Week): 7, Weekly Exercise (Minutes/Week): 105, Intensity: Moderate, Exercise limited by: None identified   Goals      DIET - EAT MORE FRUITS AND VEGETABLES     Increase physical activity     Manage My Medicine     Timeframe:  Long-Range Goal Priority:  High Start Date:                             Expected End Date:                       Follow Up Date 10/01/2021  In Progress:   - call for medicine refill 2 or 3 days before it runs out - call if I am sick and can't take my medicine - keep a  list of all the medicines I take; vitamins and herbals too    Why is this important?   These steps will help you keep on track with your medicines.   Notes:  Please call with any questions or concerns, and fill out patient assistance paperwork that was mailed to you for next year.        Depression Screen PHQ 2/9 Scores 06/17/2021 11/10/2020 11/04/2019 02/06/2019  PHQ - 2 Score 0 0  0 0     Fall Risk Fall Risk  06/17/2021  Falls in the past year? 0  Number falls in past yr: 0  Comment -  Injury with Fall? 0    Cognitive Function:     6CIT Screen 06/17/2021  What Year? 0 points  What month? 0 points  What time? 0 points  Count back from 20 0 points  Months in reverse 0 points  Repeat phrase 0 points  Total Score 0    Patient Care Team: Glendale Chard, MD as PCP - General (Internal Medicine) Mayford Knife, [facility] (Pharmacist)     Plan:   1. Welcome to Medicare preventive visit The Welcome to Medicare annual wellness visit was performed including discussion of advanced directives, assessment of functional status and cognitive function. EKG performed, .EKG results significant for NSR w/o acute changes.  A full exam was also performed. She will rto in one year for AWV with [facility] Advisor.  PATIENT IS ADVISED TO GET 30-45 MINUTES REGULAR EXERCISE NO LESS THAN FOUR TO FIVE DAYS PER WEEK - BOTH WEIGHTBEARING EXERCISES AND AEROBIC ARE RECOMMENDED.  PATIENT IS ADVISED TO FOLLOW A HEALTHY DIET WITH AT LEAST SIX FRUITS/VEGGIES PER DAY, DECREASE INTAKE OF RED MEAT, AND TO INCREASE FISH INTAKE TO TWO DAYS PER WEEK.  MEATS/FISH SHOULD NOT BE FRIED, BAKED OR BROILED IS PREFERABLE.  IT IS ALSO IMPORTANT TO CUT BACK ON YOUR SUGAR INTAKE. PLEASE AVOID ANYTHING WITH ADDED SUGAR, CORN SYRUP OR OTHER SWEETENERS. IF YOU MUST USE A SWEETENER, YOU CAN TRY STEVIA. IT IS ALSO IMPORTANT TO AVOID ARTIFICIALLY SWEETENERS AND DIET BEVERAGES. LASTLY, I SUGGEST WEARING SPF 50 SUNSCREEN ON EXPOSED PARTS AND ESPECIALLY WHEN IN THE DIRECT SUNLIGHT FOR AN EXTENDED PERIOD OF TIME.  PLEASE AVOID FAST FOOD RESTAURANTS AND INCREASE YOUR WATER INTAKE.  - EKG 12-Lead  2. Type 2 diabetes mellitus with diabetic neuropathy, with long-term current use of insulin (HCC) Comments: Diabetic foot exam was performed. I will check labs as below and make med adjustments as needed. She will f/u in 20month for  re-evaluation. I DISCUSSED WITH THE PATIENT AT LENGTH REGARDING THE GOALS OF GLYCEMIC CONTROL AND POSSIBLE LONG-TERM COMPLICATIONS.  I  ALSO STRESSED THE IMPORTANCE OF COMPLIANCE WITH HOME GLUCOSE MONITORING, DIETARY RESTRICTIONS INCLUDING AVOIDANCE OF SUGARY DRINKS/PROCESSED FOODS,  ALONG WITH REGULAR EXERCISE.  I  ALSO STRESSED THE IMPORTANCE OF ANNUAL EYE EXAMS, SELF FOOT CARE AND COMPLIANCE WITH OFFICE VISITS.  - POCT Urinalysis Dipstick (81002) - Microalbumin / Creatinine Urine Ratio - CBC - CMP14+EGFR - Lipid panel  3. Essential (primary) hypertension Comments: Chronic, well controlled. EKG performed, NSR w/o acute changes. No med changes today. She is encouraged to follow low sodium diet. She will rto in 4 months for re-evaluation.  - EKG 12-Lead  4. Multinodular goiter Comments: May 2022 thyroid u/s reviewed. She has two left nodules that require f/u in one year.  - [place]; Future  5. Paresthesia and pain of extremity Comments: I will check B12 and TSH today. I  will make further recommendations as needed.  - Vitamin B12 - Specimen status report  6. Vitamin D insufficiency Comments: I will check vitamin D level and supplement as needed.  - VITAMIN D 25 Hydroxy (Vit-D Deficiency, Fractures)  7. Body mass index (BMI) of 25.0 to 25.9 in adult Comments: She is encouraged to aim for at least 150 minutes of exercise per week.     I have personally reviewed and noted the following in the patients chart:   Medical and social history Use of alcohol, tobacco or illicit drugs  Current medications and supplements Functional ability and status Nutritional status Physical activity Advanced directives List of other physicians Hospitalizations, surgeries, and ER visits in previous 12 months Vitals Screenings to include cognitive, depression, and falls Referrals and appointments  In addition, I have reviewed and discussed with patient certain preventive protocols, quality  metrics, and best practice recommendations. A written personalized care plan for preventive services as well as general preventive health recommendations were provided to patient.     [name], MD 06/24/2021

## 2021-06-17 NOTE — Patient Instructions (Signed)
°  Ms. Shoe , Thank you for taking time to come for your Medicare Wellness Visit. I appreciate your ongoing commitment to your health goals. Please review the following plan we discussed and let me know if I can assist you in the future.   These are the goals we discussed:  Goals      Manage My Medicine     Timeframe:  Long-Range Goal Priority:  High Start Date:                             Expected End Date:                       Follow Up Date 10/01/2021  In Progress:   - call for medicine refill 2 or 3 days before it runs out - call if I am sick and can't take my medicine - keep a list of all the medicines I take; vitamins and herbals too    Why is this important?   These steps will help you keep on track with your medicines.   Notes:  Please call with any questions or concerns, and fill out patient assistance paperwork that was mailed to you for next year.         This is a list of the screening recommended for you and due dates:  Health Maintenance  Topic Date Due   Eye exam for diabetics  08/07/2020   Pap Smear  12/14/2020   DEXA scan (bone density measurement)  12/23/2020   COVID-19 Vaccine (5 - Booster for Moderna series) 03/07/2021   Complete foot exam   06/09/2021   HIV Screening  11/10/2021*   Hemoglobin A1C  10/12/2021   Mammogram  02/25/2023   Tetanus Vaccine  09/10/2023   Pneumonia Vaccine (3 - PPSV23 if available, else PCV20) 05/07/2024   Colon Cancer Screening  02/08/2031   Flu Shot  Completed   Hepatitis C Screening: USPSTF Recommendation to screen - Ages 18-79 yo.  Completed   Zoster (Shingles) Vaccine  Completed   HPV Vaccine  Aged Out  *Topic was postponed. The date shown is not the original due date.

## 2021-06-17 NOTE — Progress Notes (Signed)
a1c

## 2021-06-18 LAB — CMP14+EGFR
ALT: 19 IU/L (ref 0–32)
AST: 21 IU/L (ref 0–40)
Albumin/Globulin Ratio: 2 (ref 1.2–2.2)
Albumin: 4.7 g/dL (ref 3.8–4.8)
Alkaline Phosphatase: 125 IU/L — ABNORMAL HIGH (ref 44–121)
BUN/Creatinine Ratio: 20 (ref 12–28)
BUN: 18 mg/dL (ref 8–27)
Bilirubin Total: 0.2 mg/dL (ref 0.0–1.2)
CO2: 26 mmol/L (ref 20–29)
Calcium: 9.2 mg/dL (ref 8.7–10.3)
Chloride: 103 mmol/L (ref 96–106)
Creatinine, Ser: 0.89 mg/dL (ref 0.57–1.00)
Globulin, Total: 2.3 g/dL (ref 1.5–4.5)
Glucose: 269 mg/dL — ABNORMAL HIGH (ref 70–99)
Potassium: 4.3 mmol/L (ref 3.5–5.2)
Sodium: 143 mmol/L (ref 134–144)
Total Protein: 7 g/dL (ref 6.0–8.5)
eGFR: 72 mL/min/{1.73_m2} (ref >59)

## 2021-06-18 LAB — CBC
Hematocrit: 38.1 % (ref 34.0–46.6)
Hemoglobin: 12.8 g/dL (ref 11.1–15.9)
MCH: 29.2 pg (ref 26.6–33.0)
MCHC: 33.6 g/dL (ref 31.5–35.7)
MCV: 87 fL (ref 79–97)
Platelets: 305 10*3/uL (ref 150–450)
RBC: 4.38 x10E6/uL (ref 3.77–5.28)
RDW: 12.5 % (ref 11.7–15.4)
WBC: 4.6 10*3/uL (ref 3.4–10.8)

## 2021-06-18 LAB — MICROALBUMIN / CREATININE URINE RATIO
Creatinine, Urine: 47.3 mg/dL
Microalb/Creat Ratio: <6 mg/g creat (ref 0–29)
Microalbumin, Urine: <3 ug/mL

## 2021-06-18 LAB — VITAMIN D 25 HYDROXY (VIT D DEFICIENCY, FRACTURES): Vit D, 25-Hydroxy: 20.6 ng/mL — ABNORMAL LOW (ref 30.0–100.0)

## 2021-06-20 ENCOUNTER — Encounter: Payer: Self-pay | Admitting: Internal Medicine

## 2021-06-21 ENCOUNTER — Ambulatory Visit: Payer: Medicare Other

## 2021-06-22 LAB — SPECIMEN STATUS REPORT

## 2021-06-22 LAB — VITAMIN B12: Vitamin B-12: 509 pg/mL (ref 232–1245)

## 2021-06-24 DIAGNOSIS — Z6825 Body mass index (BMI) 25.0-25.9, adult: Secondary | ICD-10-CM | POA: Insufficient documentation

## 2021-06-24 DIAGNOSIS — E559 Vitamin D deficiency, unspecified: Secondary | ICD-10-CM | POA: Insufficient documentation

## 2021-06-27 ENCOUNTER — Encounter: Payer: Self-pay | Admitting: Internal Medicine

## 2021-06-29 ENCOUNTER — Other Ambulatory Visit: Payer: Medicare Other

## 2021-06-29 ENCOUNTER — Other Ambulatory Visit: Payer: Self-pay

## 2021-06-29 DIAGNOSIS — E1100 Type 2 diabetes mellitus with hyperosmolarity without nonketotic hyperglycemic-hyperosmolar coma (NKHHC): Secondary | ICD-10-CM

## 2021-06-29 LAB — HEMOGLOBIN A1C
Est. average glucose Bld gHb Est-mCnc: 180 mg/dL
Hgb A1c MFr Bld: 7.9 % — ABNORMAL HIGH (ref 4.8–5.6)

## 2021-07-05 ENCOUNTER — Telehealth: Payer: Self-pay

## 2021-07-05 NOTE — Chronic Care Management (AMB) (Signed)
07/05/2021- Patient assistance medication Ozempic arrived to PCP office from [employer]. Called patient to inform, patient aware will pick up today.  [name], [place] Pharmacist Assistant [phone]'

## 2021-07-07 ENCOUNTER — Inpatient Hospital Stay: Admission: RE | Admit: 2021-07-07 | Payer: Medicare Other | Source: Ambulatory Visit

## 2021-07-08 ENCOUNTER — Ambulatory Visit
Admission: RE | Admit: 2021-07-08 | Discharge: 2021-07-08 | Disposition: A | Discharge status: Home / Self Care | Payer: Medicare Other | Source: Ambulatory Visit | Attending: Internal Medicine | Admitting: Internal Medicine

## 2021-07-08 ENCOUNTER — Other Ambulatory Visit: Payer: Self-pay

## 2021-07-08 DIAGNOSIS — E2839 Other primary ovarian failure: Secondary | ICD-10-CM

## 2021-07-10 ENCOUNTER — Encounter: Payer: Self-pay | Admitting: Internal Medicine

## 2021-07-11 LAB — LIPID PANEL
Chol/HDL Ratio: 3.1 ratio (ref 0.0–4.4)
Cholesterol, Total: 162 mg/dL (ref 100–199)
HDL: 52 mg/dL (ref >39)
LDL Chol Calc (NIH): 83 mg/dL (ref 0–99)
Triglycerides: 155 mg/dL — ABNORMAL HIGH (ref 0–149)
VLDL Cholesterol Cal: 27 mg/dL (ref 5–40)

## 2021-07-11 LAB — SPECIMEN STATUS REPORT

## 2021-07-11 LAB — HGB A1C W/O EAG: Hgb A1c MFr Bld: 7.8 % — ABNORMAL HIGH (ref 4.8–5.6)

## 2021-07-25 ENCOUNTER — Ambulatory Visit: Payer: Medicare Other | Admitting: Internal Medicine

## 2021-07-26 ENCOUNTER — Other Ambulatory Visit: Payer: Self-pay

## 2021-07-26 ENCOUNTER — Ambulatory Visit (INDEPENDENT_AMBULATORY_CARE_PROVIDER_SITE_OTHER): Payer: Medicare Other

## 2021-07-26 DIAGNOSIS — Z23 Encounter for immunization: Secondary | ICD-10-CM | POA: Diagnosis not present

## 2021-07-26 NOTE — Progress Notes (Signed)
° °  Covid-19 Vaccination Clinic  Name:  Sylvia Kelly    MRN: [id number] DOB: 11-26-1955  07/26/2021  Sylvia Kelly was observed post Covid-19 immunization for 15 minutes without incident. She was provided with Vaccine Information Sheet and instruction to access the V-Safe system.   Sylvia Kelly was instructed to call 911 with any severe reactions post vaccine: Difficulty breathing  Swelling of face and throat  A fast heartbeat  A bad rash all over body  Dizziness and weakness   Immunizations Administered     Name Date Dose VIS Date Route   Moderna Covid-19 vaccine Bivalent Booster 07/26/2021  3:27 PM 0.5 mL 01/08/2021 Intramuscular   Manufacturer: Moderna   Lot: [id number]   Trail: [id number]

## 2021-08-02 ENCOUNTER — Telehealth: Payer: Self-pay

## 2021-08-02 NOTE — Chronic Care Management (AMB) (Signed)
? ? ?Chronic Care Management ?Pharmacy Assistant  ? ?Name: Sylvia Kelly  MRN: [id number] DOB: [birth year] ? ?Reason for Encounter: Disease State/ Diabetes ? ?Recent office visits:  ?07-26-2021 Covid booster given. ? ?06-17-2021 Glendale Chard, MD. Medicare wellness visit. Glucose= 269, Alkaline= 125. A1C= 7.8. Trig= 155. Vitamin D = 20.6. US thyroid orders placed. EKG completed. STOP sodium chloride and ibuprofen. ? ?06-07-2021 Michelle Nasuti, [place]. Visit for 2nd shingrix. ? ?Recent consult visits:  ?05-18-2021 Aletha Halim, MD (OBGYN). Abnormal Cervicovaginal results. START Diflucan 150 mg Take 1 tablet (150 mg total) by mouth once for 1 dose. Can take additional dose three days later if symptoms persist. ? ?Hospital visits:  ?None in previous 6 months ? ?Medications: ?Outpatient Encounter Medications as of 08/02/2021  ?Medication Sig  ? aspirin 81 MG tablet Take 81 mg by mouth daily.  ? atorvastatin (LIPITOR) 80 MG tablet Take 1 tablet (80 mg total) by mouth daily.  ? Blood Glucose Monitoring Suppl (ONETOUCH VERIO REFLECT) w/Device KIT Use as directed to check blood sugars 3 times per day dx: e11.65  ? dapagliflozin propanediol (FARXIGA) 10 MG TABS tablet Take by mouth daily.  ? fluticasone (FLONASE) 50 MCG/ACT nasal spray SPRAY 2 SPRAYS INTO EACH NOSTRIL EVERY DAY  ? insulin degludec (TRESIBA FLEXTOUCH) 200 UNIT/ML FlexTouch Pen Inject 56 Units into the skin at bedtime. (Patient taking differently: Inject 40 Units into the skin at bedtime.)  ? Insulin Pen Needle (B-D UF III MINI PEN NEEDLES) 31G X 5 MM MISC USE AS DIRECTED WITH LEVEMIR  ? lisinopril-hydrochlorothiazide (ZESTORETIC) 20-25 MG tablet TAKE 1/2 TABLET BY MOUTH EVERY DAY  ? Multiple Vitamin (MULTIVITAMIN) tablet Take 1 tablet by mouth daily.  ? OneTouch Delica Lancets 78E MISC Use as directed to check blood sugars 3 times per day dx: e11.65  ? ONETOUCH VERIO test strip USE AS DIRECTED TO CHECK BLOOD SUGARS 3 TIMES PER DAY DX: E11.65  ? OZEMPIC, 1  MG/DOSE, 4 MG/3ML SOPN INJECT 1 MG INTO THE SKIN ONCE A WEEK.  ? ?Facility-Administered Encounter Medications as of 08/02/2021  ?Medication  ? dextrose 5 % solution  ? ?Recent Relevant Labs: ?Lab Results  ?Component Value Date/Time  ? HGBA1C 7.9 (H) 06/29/2021 11:15 AM  ? HGBA1C 7.8 (H) 06/17/2021 04:24 PM  ? HGBA1C 7.7 01/16/2018 12:00 AM  ? MICROALBUR 30 11/10/2020 12:48 PM  ? MICROALBUR 30 11/04/2019 11:00 AM  ?  ?Kidney Function ?Lab Results  ?Component Value Date/Time  ? CREATININE 0.89 06/17/2021 04:24 PM  ? CREATININE 0.84 04/14/2021 03:55 PM  ? GFRNONAA 75 06/09/2020 11:01 AM  ? GFRAA 86 06/09/2020 11:01 AM  ? ? ?Current antihyperglycemic regimen:  ?Tresiba 40 units at bedtime ?Ozempic 1 mg weekly ?Farxiga 10 mg daily ? ?What recent interventions/DTPs have been made to improve glycemic control:  ?Educated on A1c and blood sugar goals; ?Complications of diabetes including kidney damage, retinal damage, and cardiovascular disease; ?Continuous glucose monitoring; ?-Will follow up to determine possible cost of freestyle libre.  ?-Counseled to check feet daily and get yearly eye exams ?-Recommended to continue current medication ? ?Have there been any recent hospitalizations or ED visits since last visit with CPP? No ? ?Patient denies hypoglycemic symptoms ? ?Patient denies hyperglycemic symptoms ? ?How often are you checking your blood sugar? once daily ? ?What are your blood sugars ranging?  ?Fasting: 71, 143, 80,100 ?Before meals: None ?After meals: None ?Bedtime: None ? ?During the week, how often does your blood glucose drop below 70? Never ? ?  Are you checking your feet daily/regularly? Daily ? ?Adherence Review: ?Is the patient currently on a STATIN medication? Yes ?Is the patient currently on ACE/ARB medication? Yes ?Does the patient have >5 day gap between last estimated fill dates? No ? ?Care Gaps: ?AWV 07-06-2022 ? ?Star Rating Drugs: ?Atorvastatin 80 mg- Last filled 06-04-2021 [facility] ?Farxiga 10 mg-  Patient assistance ?Lisinopril/HCTZ 20-25 mg- Last filled 06-06-2021 [facility] ?Ozempic 1 mg- Patient assistance ? ?Malecca Hicks CMA ?Clinical Pharmacist Assistant ?[phone] ? ?

## 2021-08-04 IMAGING — US US THYROID
1 series · 12 of 25 positions shown · non-contrast
Comparison: None.

CLINICAL DATA: 63-year-old female with thyroid goiter

EXAM:
THYROID ULTRASOUND
TECHNIQUE: Ultrasound examination of the thyroid gland and adjacent soft
tissues was performed.

[Series 1: us thyroid · 0.06mm/px · 12 of 54 slices shown]
[im 3/54]
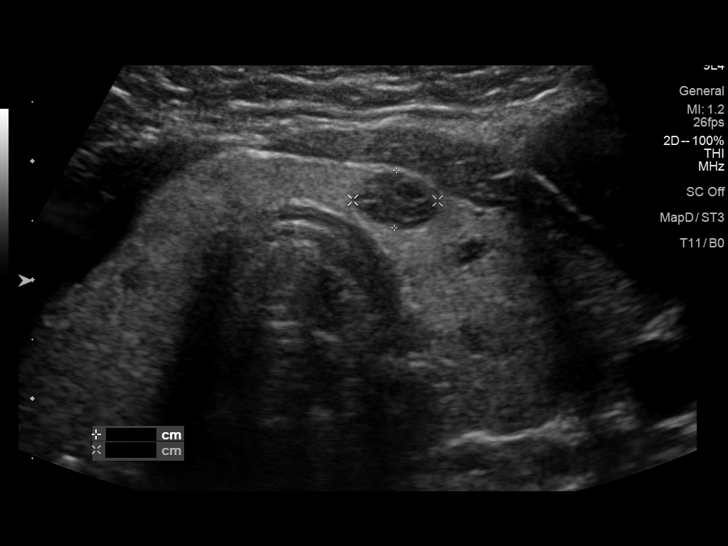
[im 7/54]
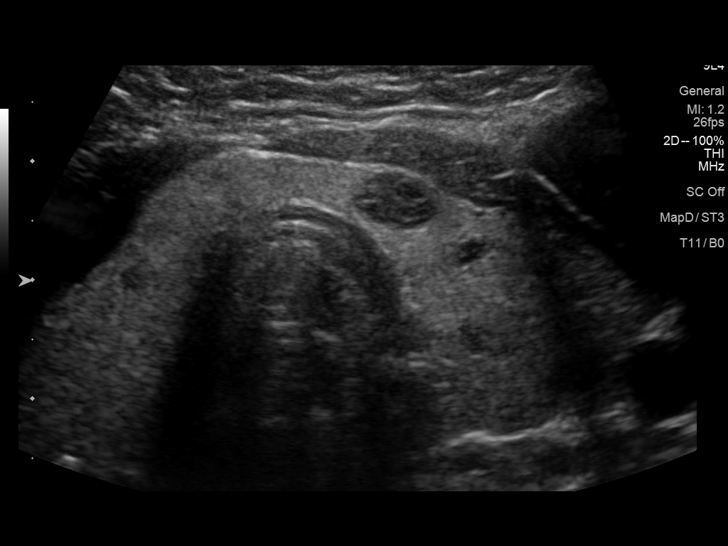
[im 12/54]
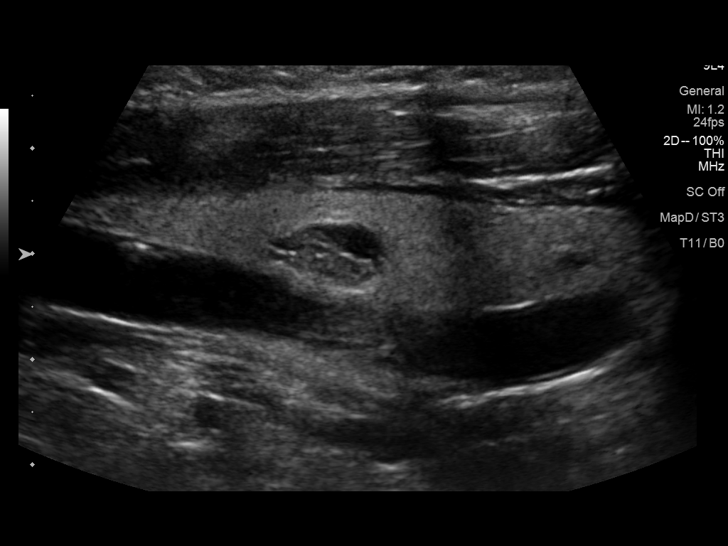
[im 16/54]
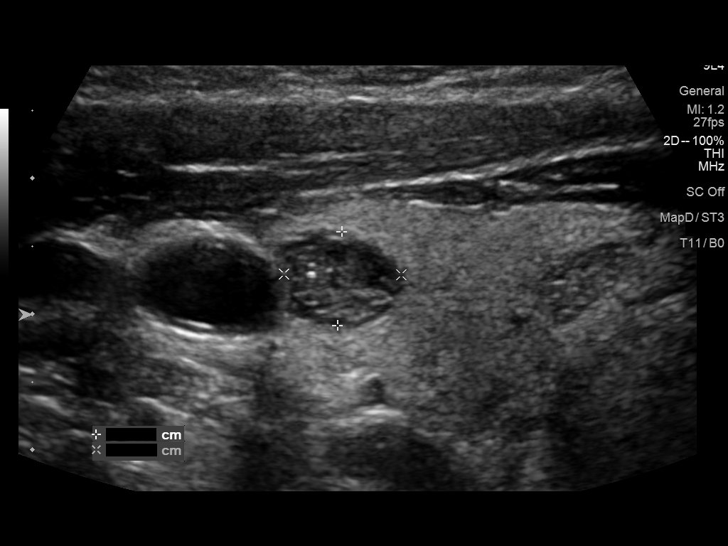
[im 20/54]
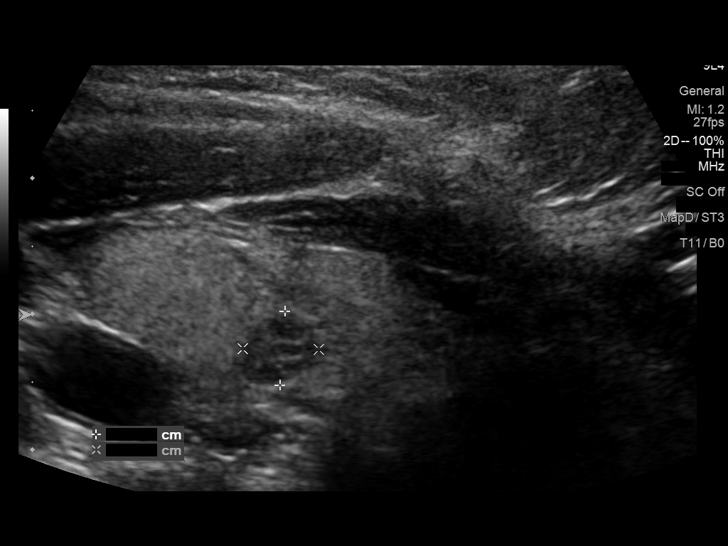
[im 25/54]
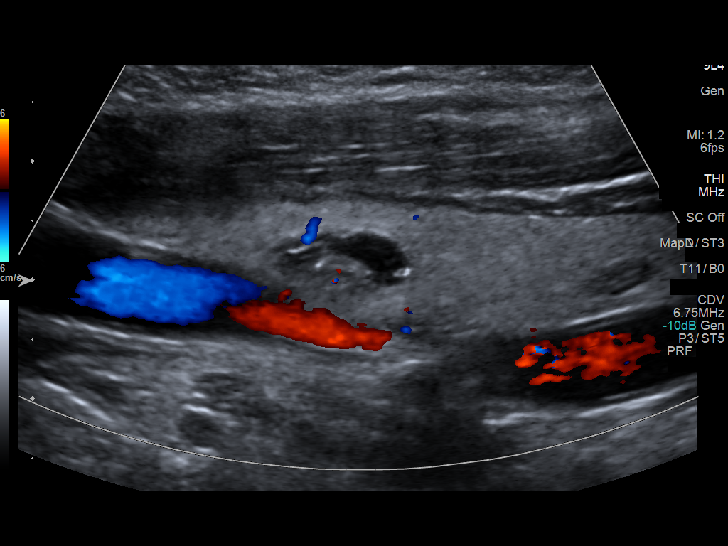
[im 29/54]
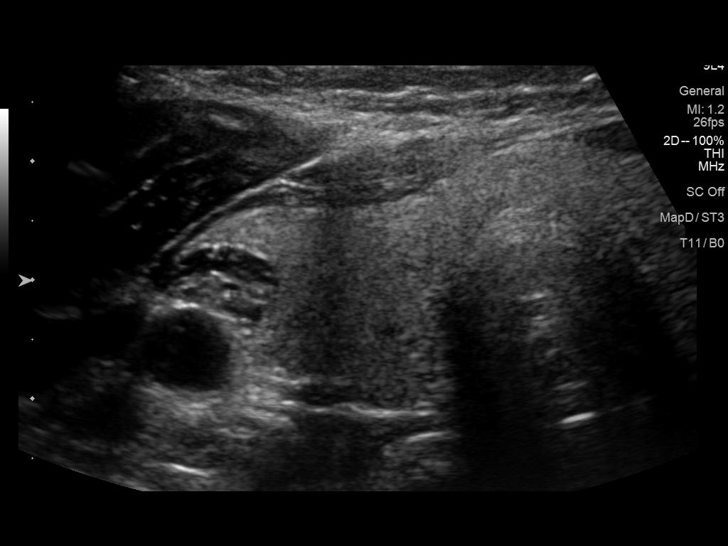
[im 34/54]
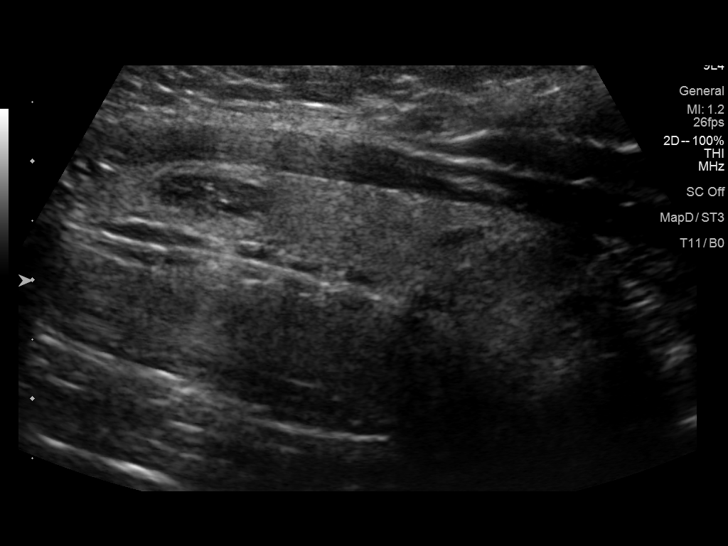
[im 38/54]
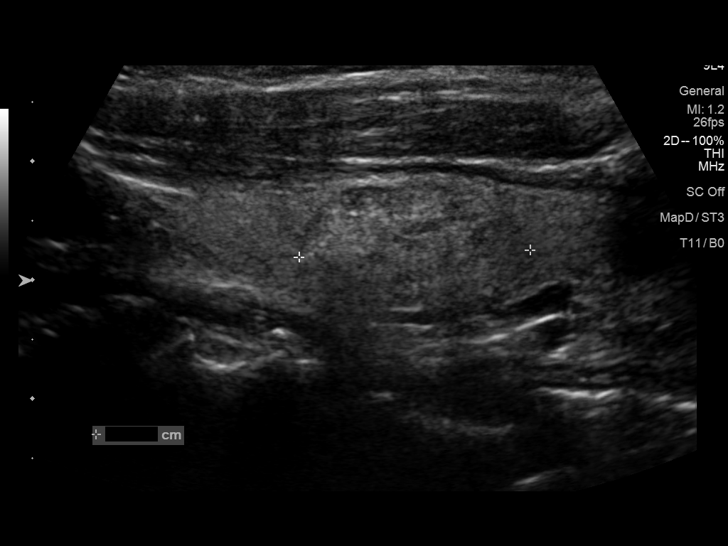
[im 42/54]
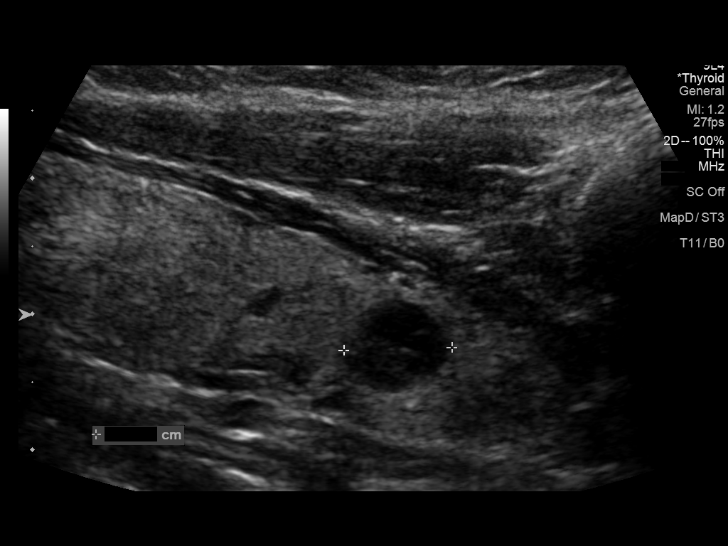
[im 47/54]
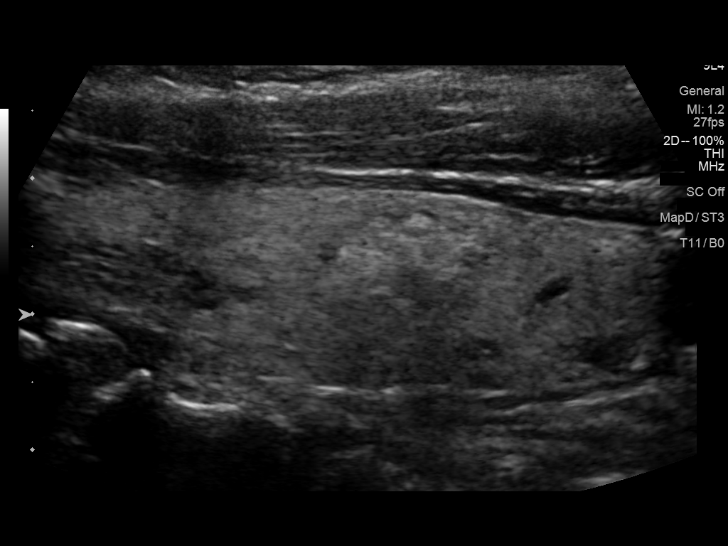
[im 51/54]
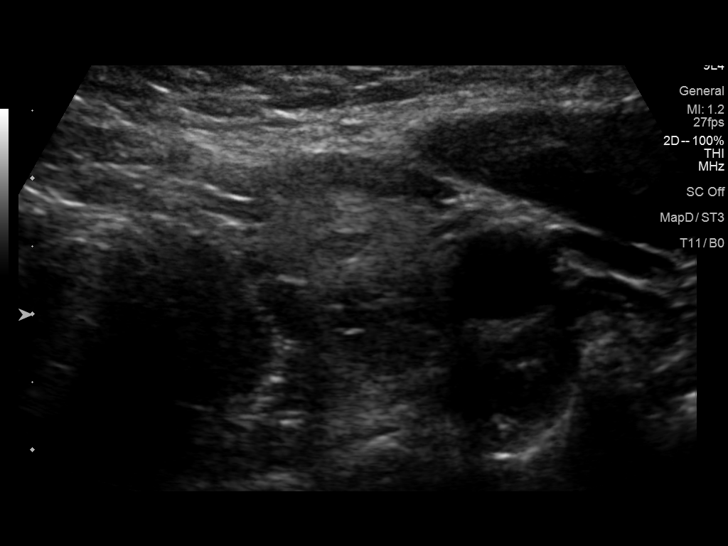

[12 of 25 positions shown; findings below may reference images not displayed]

FINDINGS: Parenchymal Echotexture: Mildly heterogenous

Isthmus: 0.7 cm

Right lobe: 5.9 cm x 2.2 cm x 2.4 cm

Left lobe: 5.3 cm x 1.8 cm x 2.2 cm

_________________________________________________________

Estimated total number of nodules >/= 1 cm: 3

Number of spongiform nodules >/=  2 cm not described below (TR1): 0

Number of mixed cystic and solid nodules >/= 1.5 cm not described
below (TR2): 0

_________________________________________________________

Nodule # 1:

Location: Isthmus; left

Maximum size: 0.9 cm; Other 2 dimensions: 0.7 cm x 0.5 cm

Composition: spongiform (0)

ACR TI-RADS recommendations:

Spongiform nodule does not meet criteria for surveillance or biopsy

_________________________________________________________

Nodule # 2:

Location: Right; Superior

Maximum size: 0.8 cm; Other 2 dimensions: 0.6 cm x 0.5 cm

Composition: spongiform (0)

ACR TI-RADS recommendations:

Spongiform nodule does not meet criteria for surveillance or biopsy

_________________________________________________________

Nodule # 3:

Location: Right; Mid

Maximum size: 1.1 cm; Other 2 dimensions: 0.9 cm x 0.7 cm

Composition: spongiform (0)

ACR TI-RADS recommendations:

Spongiform nodule does not meet criteria for surveillance or biopsy

_________________________________________________________

Nodule # 4:

Location: Right; Inferior

Maximum size: 0.7 cm; Other 2 dimensions: 0.6 cm x 0.6 cm

Composition: spongiform (0)

ACR TI-RADS recommendations:

Spongiform nodule does not meet criteria for surveillance or biopsy

_________________________________________________________

Nodule # 5:

Location: Right; Inferior

Maximum size: 0.6 cm; Other 2 dimensions: 0.6 cm x 0.5 cm

Composition: spongiform (0)

ACR TI-RADS recommendations:

Spongiform nodule does not meet criteria for surveillance or biopsy

_________________________________________________________

Nodule # 6:

Location: Left; Superior

Maximum size: 1.4 cm; Other 2 dimensions: 1.0 cm x 0.9 cm

Composition: spongiform (0)

ACR TI-RADS recommendations:

Spongiform nodule does not meet criteria for surveillance or biopsy

_________________________________________________________

Nodule # 7:

Location: Left; Inferior

Maximum size: 2.0 cm; Other 2 dimensions: 1.3 cm x 1.2 cm

Composition: cannot determine (2)

Echogenicity: isoechoic (1)

Shape: not taller-than-wide (0)

Margins: ill-defined (0)

Echogenic foci: none (0)

ACR TI-RADS total points: 3.

ACR TI-RADS risk category: TR3 (3 points).

ACR TI-RADS recommendations:

Nodule meets criteria for surveillance

_________________________________________________________

Nodule # 8:

Location: Left; Inferior

Maximum size: 0.7 cm; Other 2 dimensions: 0.7 cm x 0.6 cm

Composition: cystic/almost completely cystic (0)

Echogenicity: anechoic (0)

Shape: not taller-than-wide (0)

Margins: smooth (0)

Echogenic foci: none (0)

ACR TI-RADS total points: 0.

ACR TI-RADS risk category: TR1 (0-1 points).

ACR TI-RADS recommendations:

Cystic nodule does not meet criteria for surveillance or biopsy

_________________________________________________________

Nodule # 9:

Location: Left; Inferior

Maximum size: 0.8 cm; Other 2 dimensions: 0.8 cm x 0.7 cm

Composition: cystic/almost completely cystic (0)

Echogenicity: anechoic (0)

Shape: not taller-than-wide (0)

Margins: smooth (0)

Echogenic foci: none (0)

ACR TI-RADS total points: 0.

ACR TI-RADS risk category: TR1 (0-1 points).

ACR TI-RADS recommendations:

Cystic nodule does not meet criteria for surveillance or biopsy

_________________________________________________________

No adenopathy
IMPRESSION: Multinodular thyroid.

Left mid thyroid nodule (labeled 7, 2.0 cm, TR 3) meets criteria for
surveillance, as designated by the newly established ACR TI-RADS
criteria. Surveillance ultrasound study recommended to be performed
annually up to 5 years.

Recommendations follow those established by the new ACR TI-RADS
criteria ([HOSPITAL] 0004;[DATE]).

## 2021-08-12 ENCOUNTER — Telehealth: Payer: Self-pay

## 2021-08-12 NOTE — Telephone Encounter (Signed)
?  Chronic Care Management  ? ?Follow Up Note ? ? ?08/12/2021 ?Name: Sylvia Kelly MRN: [id number] DOB: July 09, 1955 ? ?Referred by: Glendale Chard, MD ?Reason for referral : No chief complaint on file. ? ? ?Sylvia Kelly is a 66 y.o. year old female who is a primary care patient of Glendale Chard, MD. The CCM team was consulted for assistance with chronic disease management and care coordination needs.   ? ?Review of patient status, including review of consultants reports, relevant laboratory and other test results, and collaboration with appropriate care team members and the patient's provider was performed as part of comprehensive patient evaluation and provision of chronic care management services.   ? ?SDOH (Social Determinants of Health) assessments performed: No ?See Care Plan activities for detailed interventions related to [facility])  ?$Remo'@SDOHINTERVENTIONS'JoRpo$ @  ? ?Outpatient Encounter Medications as of 08/12/2021  ?Medication Sig  ? aspirin 81 MG tablet Take 81 mg by mouth daily.  ? atorvastatin (LIPITOR) 80 MG tablet Take 1 tablet (80 mg total) by mouth daily.  ? Blood Glucose Monitoring Suppl (ONETOUCH VERIO REFLECT) w/Device KIT Use as directed to check blood sugars 3 times per day dx: e11.65  ? dapagliflozin propanediol (FARXIGA) 10 MG TABS tablet Take by mouth daily.  ? fluticasone (FLONASE) 50 MCG/ACT nasal spray SPRAY 2 SPRAYS INTO EACH NOSTRIL EVERY DAY  ? insulin degludec (TRESIBA FLEXTOUCH) 200 UNIT/ML FlexTouch Pen Inject 56 Units into the skin at bedtime. (Patient taking differently: Inject 40 Units into the skin at bedtime.)  ? Insulin Pen Needle (B-D UF III MINI PEN NEEDLES) 31G X 5 MM MISC USE AS DIRECTED WITH LEVEMIR  ? lisinopril-hydrochlorothiazide (ZESTORETIC) 20-25 MG tablet TAKE 1/2 TABLET BY MOUTH EVERY DAY  ? Multiple Vitamin (MULTIVITAMIN) tablet Take 1 tablet by mouth daily.  ? OneTouch Delica Lancets 40X MISC Use as directed to check blood sugars 3 times per day dx: e11.65  ? ONETOUCH VERIO  test strip USE AS DIRECTED TO CHECK BLOOD SUGARS 3 TIMES PER DAY DX: E11.65  ? OZEMPIC, 1 MG/DOSE, 4 MG/3ML SOPN INJECT 1 MG INTO THE SKIN ONCE A WEEK.  ? ?Facility-Administered Encounter Medications as of 08/12/2021  ?Medication  ? dextrose 5 % solution  ?  ? ?Objective:  ? ?CARE PLAN ENTRY ?(see longitudinal plan of care for additional care plan information) ? ?Current Barriers:  ?Financial Barriers: patient has [employer] and reports copay for Ozempic & is cost prohibitive at this time ? ? ?Pharmacist Clinical Goal(s):  ?Over the next 30 days, patient will work with PharmD and providers to relieve medication access concerns ? ?Interventions: ?Comprehensive medication review completed; medication list updated in electronic medical record.  ?Inter-disciplinary care team collaboration (see longitudinal plan of care) ?Called patient to let her know that her Ozempic came in through delivery.  ? ?Patient Self Care Activities:  ?Patient will come in to pick up [place].  ? ?[name], CPP, PharmD ?Clinical Pharmacist Practitioner ?[facility] ?[phone] ? ?

## 2021-09-01 ENCOUNTER — Encounter: Payer: Self-pay | Admitting: Internal Medicine

## 2021-09-02 ENCOUNTER — Other Ambulatory Visit: Payer: Self-pay | Admitting: Internal Medicine

## 2021-09-26 ENCOUNTER — Ambulatory Visit (INDEPENDENT_AMBULATORY_CARE_PROVIDER_SITE_OTHER): Payer: Medicare Other | Admitting: Internal Medicine

## 2021-09-26 ENCOUNTER — Encounter: Payer: Self-pay | Admitting: Internal Medicine

## 2021-09-26 VITALS — BP 108/78 | HR 87 | Temp 98.1°F | Ht 64.0 in | Wt 149.2 lb

## 2021-09-26 DIAGNOSIS — E041 Nontoxic single thyroid nodule: Secondary | ICD-10-CM

## 2021-09-26 DIAGNOSIS — Z794 Long term (current) use of insulin: Secondary | ICD-10-CM

## 2021-09-26 DIAGNOSIS — I10 Essential (primary) hypertension: Secondary | ICD-10-CM

## 2021-09-26 DIAGNOSIS — E1140 Type 2 diabetes mellitus with diabetic neuropathy, unspecified: Secondary | ICD-10-CM | POA: Diagnosis not present

## 2021-09-26 DIAGNOSIS — K5140 Inflammatory polyps of colon without complications: Secondary | ICD-10-CM | POA: Diagnosis not present

## 2021-09-26 DIAGNOSIS — Z6825 Body mass index (BMI) 25.0-25.9, adult: Secondary | ICD-10-CM

## 2021-09-26 NOTE — Progress Notes (Signed)
?I,[name],acting as a scribe for [name], MD.,have documented all relevant documentation on the behalf of [name], MD,as directed by  [name], MD while in the presence of [name], MD.  ?This visit occurred during the SARS-CoV-2 public health emergency.  Safety protocols were in place, including screening questions prior to the visit, additional usage of staff PPE, and extensive cleaning of exam room while observing appropriate contact time as indicated for disinfecting solutions. ? ?Subjective:  ?  ? Patient ID: Sylvia Kelly , female    DOB: 02/17/1956 , 66 y.o.   MRN: [id number] ? ? ?Chief Complaint  ?Patient presents with  ? Diabetes  ? Hypertension  ? ? ?HPI ? ?She presents today for DM/HTN follow-up.  She reports compliance with meds. She states her sugars have improved greatly since making some dietary changes. No chest pain, blurred vision, SOB, dizziness. States her fasting sugars range from 120-130s.  ? ? ?Diabetes ?She presents for her follow-up diabetic visit. She has type 2 diabetes mellitus. Her disease course has been improving. There are no hypoglycemic associated symptoms. Pertinent negatives for diabetes include no blurred vision, no chest pain and no fatigue. There are no hypoglycemic complications. Diabetic complications include peripheral neuropathy. Risk factors for coronary artery disease include diabetes mellitus, dyslipidemia, sedentary lifestyle, hypertension and post-menopausal.  ?Hypertension ?This is a chronic problem. The current episode started more than 1 year ago. The problem has been gradually improving since onset. Pertinent negatives include no blurred vision, chest pain, palpitations or shortness of breath.   ? ?Past Medical History:  ?Diagnosis Date  ? Allergy   ? Arthritis   ? hip  ? Atrophic vaginitis   ? Diabetes mellitus   ? Elevated cholesterol   ? Hypertension   ? Neuromuscular disorder ([place])   ? neuropathy hands and feet  ?   ? ?Family History  ?Problem Relation Age of Onset  ? Heart disease Mother   ? Diabetes Mother   ? Cancer Father   ?     Lung cancer  ? Colon cancer Neg Hx   ? Colon polyps Neg Hx   ? Esophageal cancer Neg Hx   ? Rectal cancer Neg Hx   ? Stomach cancer Neg Hx   ? ? ? ?Current Outpatient Medications:  ?  aspirin 81 MG tablet, Take 81 mg by mouth daily., Disp: , Rfl:  ?  atorvastatin (LIPITOR) 80 MG tablet, TAKE 1 TABLET BY MOUTH EVERY DAY, Disp: 90 tablet, Rfl: 1 ?  Blood Glucose Monitoring Suppl (ONETOUCH VERIO REFLECT) w/Device KIT, Use as directed to check blood sugars 3 times per day dx: e11.65, Disp: 1 kit, Rfl: 1 ?  dapagliflozin propanediol (FARXIGA) 10 MG TABS tablet, Take by mouth daily., Disp: , Rfl:  ?  fluticasone (FLONASE) 50 MCG/ACT nasal spray, SPRAY 2 SPRAYS INTO EACH NOSTRIL EVERY DAY, Disp: 48 mL, Rfl: 1 ?  insulin degludec (TRESIBA FLEXTOUCH) 200 UNIT/ML FlexTouch Pen, Inject 56 Units into the skin at bedtime. (Patient taking differently: Inject 40 Units into the skin at bedtime.), Disp: 27 mL, Rfl: 2 ?  Insulin Pen Needle (B-D UF III MINI PEN NEEDLES) 31G X 5 MM MISC, USE AS DIRECTED WITH LEVEMIR, Disp: 150 each, Rfl: 3 ?  lisinopril-hydrochlorothiazide (ZESTORETIC) 20-25 MG tablet, TAKE 1/2 TABLET BY MOUTH EVERY DAY, Disp: 45 tablet, Rfl: 2 ?  Multiple Vitamin (MULTIVITAMIN) tablet, Take 1 tablet by mouth daily., Disp: , Rfl:  ?  OneTouch Delica Lancets 26R MISC, Use as directed to check blood sugars 3 times per day dx: e11.65, Disp: 150 each, Rfl: 3 ?  ONETOUCH VERIO test strip, USE AS DIRECTED TO CHECK BLOOD SUGARS 3 TIMES PER DAY DX: E11.65, Disp: 200 strip, Rfl: 3 ?  OZEMPIC, 1 MG/DOSE, 4 MG/3ML SOPN, INJECT 1 MG INTO THE SKIN ONCE A WEEK., Disp: 3 mL, Rfl: 2 ? ?Current Facility-Administered Medications:  ?  dextrose 5 % solution, , Intravenous, Continuous, Nandigam, Venia Minks, MD  ? ?No Known Allergies  ? ?Review of Systems  ?Constitutional: Negative.  Negative for fatigue.  ?Eyes:  Negative  for blurred vision.  ?Respiratory: Negative.  Negative for shortness of breath.   ?Cardiovascular: Negative.  Negative for chest pain and palpitations.  ?Neurological: Negative.   ?Psychiatric/Behavioral: Negative.     ? ?Today's Vitals  ? 09/26/21 1557  ?BP: 108/78  ?Pulse: 87  ?Temp: 98.1 ?F (36.7 ?C)  ?SpO2: 98%  ?Weight: 149 lb 3.2 oz (67.7 kg)  ?Height: 5' 4"  (1.626 m)  ? ?Body mass index is 25.61 kg/m?.  ?Wt Readings from Last 3 Encounters:  ?09/26/21 149 lb 3.2 oz (67.7 kg)  ?06/17/21 148 lb 12.8 oz (67.5 kg)  ?06/07/21 148 lb 9.6 oz (67.4 kg)  ?  ?Objective:  ?Physical Exam ?Vitals and nursing note reviewed.  ?Constitutional:   ?   Appearance: Normal appearance.  ?HENT:  ?   Head: Normocephalic and atraumatic.  ?Eyes:  ?   Extraocular Movements: Extraocular movements intact.  ?Cardiovascular:  ?   Rate and Rhythm: Normal rate and regular rhythm.  ?   Heart sounds: Normal heart sounds.  ?Pulmonary:  ?   Effort: Pulmonary effort is normal.  ?   Breath sounds: Normal breath sounds.  ?Musculoskeletal:  ?   Cervical back: Normal range of motion.  ?Skin: ?   General: Skin is warm.  ?Neurological:  ?   General: No focal deficit present.  ?   Mental Status: She is alert.  ?Psychiatric:     ?   Mood and Affect: Mood normal.     ?   Behavior: Behavior normal.  ?    ?Assessment And Plan:  ?   ?1. Type 2 diabetes mellitus with diabetic neuropathy, with long-term current use of insulin ([place]) ?Comments: Chronic, she was commended on her dietary changes. I will check labs as below, and adjust meds as needed. Goal a1c<7.4. She will f/u in 3 months.  ?- BMP8+EGFR ?- Hemoglobin A1c ?- Amb Referral To Provider Referral Exercise Program (P.R.E.P) ? ?2. Essential (primary) hypertension ?Comments: Chronic, well controlled. She is encouraged to follow low sodium diet. She agrees to referral to PREP program.  ?- Amb Referral To Provider Referral Exercise Program (P.R.E.P) ? ?3. Thyroid nodule ?Comments: Previous thyroid u/s results  reviewed, she is due for repeat u/s in late May 2023.  ? ?4. Pseudopolyposis of colon without complication, unspecified part of colon ([place]) ?Comments: Inflammatory polyps per GI on recent colonoscopy September 2022.  ? ?5. Body mass index (BMI) of 25.0 to 25.9 in adult ?Comments: She is encouraged to aim for at least 150 minutes of exercise per week. Again, she agrees to referral to PREP program.  ?  ?Patient was given opportunity to ask questions. Patient verbalized understanding of the plan and was able to repeat key elements of the plan. All questions were answered to their satisfaction.  ? ?I, [name], MD, have reviewed all documentation for this visit.  The documentation on 09/26/21 for the exam, diagnosis, procedures, and orders are all accurate and complete.  ? ?IF YOU HAVE BEEN REFERRED TO A SPECIALIST, IT MAY TAKE 1-2 WEEKS TO SCHEDULE/PROCESS THE REFERRAL. IF YOU HAVE NOT HEARD FROM US/SPECIALIST IN TWO WEEKS, PLEASE GIVE [place] A CALL AT [phone] X [phone].  ? ?THE PATIENT IS ENCOURAGED TO PRACTICE SOCIAL DISTANCING DUE TO THE COVID-19 PANDEMIC.   ?

## 2021-09-26 NOTE — Patient Instructions (Signed)
Hypertension, Adult ?Hypertension is another name for high blood pressure. High blood pressure forces your heart to work harder to pump blood. This can cause problems over time. ?There are two numbers in a blood pressure reading. There is a top number (systolic) over a bottom number (diastolic). It is best to have a blood pressure that is below 120/80. ?What are the causes? ?The cause of this condition is not known. Some other conditions can lead to high blood pressure. ?What increases the risk? ?Some lifestyle factors can make you more likely to develop high blood pressure: ?Smoking. ?Not getting enough exercise or physical activity. ?Being overweight. ?Having too much fat, sugar, calories, or salt (sodium) in your diet. ?Drinking too much alcohol. ?Other risk factors include: ?Having any of these conditions: ?Heart disease. ?Diabetes. ?High cholesterol. ?Kidney disease. ?Obstructive sleep apnea. ?Having a family history of high blood pressure and high cholesterol. ?Age. The risk increases with age. ?Stress. ?What are the signs or symptoms? ?High blood pressure may not cause symptoms. Very high blood pressure (hypertensive crisis) may cause: ?Headache. ?Fast or uneven heartbeats (palpitations). ?Shortness of breath. ?Nosebleed. ?Vomiting or feeling like you may vomit (nauseous). ?Changes in how you see. ?Very bad chest pain. ?Feeling dizzy. ?Seizures. ?How is this treated? ?This condition is treated by making healthy lifestyle changes, such as: ?Eating healthy foods. ?Exercising more. ?Drinking less alcohol. ?Your doctor may prescribe medicine if lifestyle changes do not help enough and if: ?Your top number is above 130. ?Your bottom number is above 80. ?Your personal target blood pressure may vary. ?Follow these instructions at home: ?Eating and drinking ? ?If told, follow the DASH eating plan. To follow this plan: ?Fill one half of your plate at each meal with fruits and vegetables. ?Fill one fourth of your plate  at each meal with whole grains. Whole grains include whole-wheat pasta, brown rice, and whole-grain bread. ?Eat or drink low-fat dairy products, such as skim milk or low-fat yogurt. ?Fill one fourth of your plate at each meal with low-fat (lean) proteins. Low-fat proteins include fish, chicken without skin, eggs, beans, and tofu. ?Avoid fatty meat, cured and processed meat, or chicken with skin. ?Avoid pre-made or processed food. ?Limit the amount of salt in your diet to less than 1,500 mg each day. ?Do not drink alcohol if: ?Your doctor tells you not to drink. ?You are pregnant, may be pregnant, or are planning to become pregnant. ?If you drink alcohol: ?Limit how much you have to: ?0-1 drink a day for women. ?0-2 drinks a day for men. ?Know how much alcohol is in your drink. In the [place] one drink equals one 12 oz bottle of beer (355 mL), one 5 oz glass of wine (148 mL), or one 1? oz glass of hard liquor (44 mL). ?Lifestyle ? ?Work with your doctor to stay at a healthy weight or to lose weight. Ask your doctor what the best weight is for you. ?Get at least 30 minutes of exercise that causes your heart to beat faster (aerobic exercise) most days of the week. This may include walking, swimming, or biking. ?Get at least 30 minutes of exercise that strengthens your muscles (resistance exercise) at least 3 days a week. This may include lifting weights or doing Pilates. ?Do not smoke or use any products that contain nicotine or tobacco. If you need help quitting, ask your doctor. ?Check your blood pressure at home as told by your doctor. ?Keep all follow-up visits. ?Medicines ?Take over-the-counter and prescription medicines   only as told by your doctor. Follow directions carefully. ?Do not skip doses of blood pressure medicine. The medicine does not work as well if you skip doses. Skipping doses also puts you at risk for problems. ?Ask your doctor about side effects or reactions to medicines that you should watch  for. ?Contact a doctor if: ?You think you are having a reaction to the medicine you are taking. ?You have headaches that keep coming back. ?You feel dizzy. ?You have swelling in your ankles. ?You have trouble with your vision. ?Get help right away if: ?You get a very bad headache. ?You start to feel mixed up (confused). ?You feel weak or numb. ?You feel faint. ?You have very bad pain in your: ?Chest. ?Belly (abdomen). ?You vomit more than once. ?You have trouble breathing. ?These symptoms may be an emergency. Get help right away. Call 911. ?Do not wait to see if the symptoms will go away. ?Do not drive yourself to the hospital. ?Summary ?Hypertension is another name for high blood pressure. ?High blood pressure forces your heart to work harder to pump blood. ?For most people, a normal blood pressure is less than 120/80. ?Making healthy choices can help lower blood pressure. If your blood pressure does not get lower with healthy choices, you may need to take medicine. ?This information is not intended to replace advice given to you by your health care provider. Make sure you discuss any questions you have with your health care provider. ?Document Revised: 03/03/2021 Document Reviewed: 03/03/2021 ?Elsevier Patient Education ? 2023 Elsevier Inc. ? ?

## 2021-09-27 LAB — BMP8+EGFR
BUN/Creatinine Ratio: 23 (ref 12–28)
BUN: 21 mg/dL (ref 8–27)
CO2: 27 mmol/L (ref 20–29)
Calcium: 9.1 mg/dL (ref 8.7–10.3)
Chloride: 100 mmol/L (ref 96–106)
Creatinine, Ser: 0.91 mg/dL (ref 0.57–1.00)
Glucose: 174 mg/dL — ABNORMAL HIGH (ref 70–99)
Potassium: 3.9 mmol/L (ref 3.5–5.2)
Sodium: 138 mmol/L (ref 134–144)
eGFR: 70 mL/min/{1.73_m2} (ref >59)

## 2021-09-27 LAB — HEMOGLOBIN A1C
Est. average glucose Bld gHb Est-mCnc: 160 mg/dL
Hgb A1c MFr Bld: 7.2 % — ABNORMAL HIGH (ref 4.8–5.6)

## 2021-09-30 ENCOUNTER — Ambulatory Visit
Admission: RE | Admit: 2021-09-30 | Discharge: 2021-09-30 | Disposition: A | Discharge status: Home / Self Care | Payer: Medicare Other | Source: Ambulatory Visit | Attending: Internal Medicine | Admitting: Internal Medicine

## 2021-09-30 DIAGNOSIS — E042 Nontoxic multinodular goiter: Secondary | ICD-10-CM

## 2021-10-07 ENCOUNTER — Telehealth: Payer: Self-pay

## 2021-10-07 NOTE — Chronic Care Management (AMB) (Addendum)
    Called [facility], No answer, left message of appointment on 10-11-2021 at 11:00 via telephone visit with [name], Pharm D. Notified to have all medications, supplements, blood pressure and/or blood sugar logs available during appointment and to return call if need to reschedule.  10-10-2021: Patient's 10-11-2021 appointment was rescheduled to 10-18-2021 due to CPP being out.  10-14-2021: sent text message to patient with appointment reminder with [name] on 10-18-2021 at [place] Pharmacist Assistant [phone]

## 2021-10-11 ENCOUNTER — Telehealth: Payer: Medicare Other

## 2021-10-17 ENCOUNTER — Telehealth: Payer: Self-pay

## 2021-10-17 NOTE — Chronic Care Management (AMB) (Signed)
[employer] patient assistance program notification:  Patient currently enrolled in auto-refill, a 120- day supply of Tresiba 200 u/ml and Ozempic '1mg'$ /ml will be filled on 10/29/2021 and should arrive to the office in 10-14 business days. Patients enrollment will expire on 04/27/2022.  [name], [place] Pharmacist Assistant [phone]

## 2021-10-18 ENCOUNTER — Ambulatory Visit (INDEPENDENT_AMBULATORY_CARE_PROVIDER_SITE_OTHER): Payer: Medicare Other

## 2021-10-18 DIAGNOSIS — E1140 Type 2 diabetes mellitus with diabetic neuropathy, unspecified: Secondary | ICD-10-CM

## 2021-10-18 DIAGNOSIS — E7800 Pure hypercholesterolemia, unspecified: Secondary | ICD-10-CM

## 2021-10-18 NOTE — Progress Notes (Unsigned)
Chronic Care Management Pharmacy Note  10/19/2021 Name:  Sylvia Kelly MRN:  [id number] DOB:  02-29-1956  Summary: Patient reports that she has increased the amount of exercise that she is doing.   Recommendations/Changes made from today's visit: Recommend patients Tresiba dose is decreased due to low BS readings.  Recommended patient follow up with PCP team due to leg swelling Recommend patient be prescribed GVOKE pen   Plan: Patient to have appointment with PCP team on Thursday at 4:20 pm, or go to urgent care, patient would prefer to come to the office Will follow up with PCP to prescribe GVOKE pen.    Subjective: Sylvia Kelly is an 66 y.o. year old female who is a primary patient of Glendale Chard, MD.  The CCM team was consulted for assistance with disease management and care coordination needs.    Engaged with patient by telephone for follow up visit in response to provider referral for pharmacy case management and/or care coordination services.   Consent to Services:  The patient was given information about Chronic Care Management services, agreed to services, and gave verbal consent prior to initiation of services.  Please see initial visit note for detailed documentation.   Patient Care Team: Glendale Chard, MD as PCP - General (Internal Medicine) Mayford Knife, [facility] (Pharmacist)  Recent office visits: 09/26/2021 PCP [facility] visits: None in previous 6 months   Objective:  Lab Results  Component Value Date   CREATININE 0.91 09/26/2021   BUN 21 09/26/2021   EGFR 70 09/26/2021   GFRNONAA 75 06/09/2020   GFRAA 86 06/09/2020   NA 138 09/26/2021   K 3.9 09/26/2021   CALCIUM 9.1 09/26/2021   CO2 27 09/26/2021   GLUCOSE 174 (H) 09/26/2021    Lab Results  Component Value Date/Time   HGBA1C 7.2 (H) 09/26/2021 04:44 PM   HGBA1C 7.9 (H) 06/29/2021 11:15 AM   HGBA1C 7.7 01/16/2018 12:00 AM   MICROALBUR 30 11/10/2020 12:48 PM   MICROALBUR 30  11/04/2019 11:00 AM    Last diabetic Eye exam:  Lab Results  Component Value Date/Time   HMDIABEYEEXA Retinopathy (A) 10/09/2020 12:00 AM    Last diabetic Foot exam: No results found for: HMDIABFOOTEX   Lab Results  Component Value Date   CHOL 162 06/17/2021   HDL 52 06/17/2021   LDLCALC 83 06/17/2021   TRIG 155 (H) 06/17/2021   CHOLHDL 3.1 06/17/2021       Latest Ref Rng & Units 06/17/2021    4:24 PM 04/14/2021    3:55 PM 09/06/2020   11:53 AM  Hepatic Function  Total Protein 6.0 - 8.5 g/dL 7.0   6.5   6.7    Albumin 3.8 - 4.8 g/dL 4.7   4.1   4.2    AST 0 - 40 IU/L 21   19   20     ALT 0 - 32 IU/L 19   20   20     Alk Phosphatase 44 - 121 IU/L 125   99   104    Total Bilirubin 0.0 - 1.2 mg/dL 0.2   0.3   0.4    Bilirubin, Direct 0.00 - 0.40 mg/dL   0.11      Lab Results  Component Value Date/Time   TSH 0.60 10/10/2017 12:00 AM       Latest Ref Rng & Units 06/17/2021    4:24 PM 11/04/2019   10:08 AM 10/30/2018    2:37 PM  CBC  WBC 3.4 - 10.8 x10E3/uL 4.6   3.6   4.0    Hemoglobin 11.1 - 15.9 g/dL 12.8   12.1   12.3    Hematocrit 34.0 - 46.6 % 38.1   36.8   36.5    Platelets 150 - 450 x10E3/uL 305   285   288      Lab Results  Component Value Date/Time   VD25OH 20.6 (L) 06/17/2021 04:24 PM    Clinical ASCVD: No  The 10-year ASCVD risk score (Arnett DK, et al., 2019) is: 11.9%   Values used to calculate the score:     Age: 89 years     Sex: Female     Is Non-Hispanic African American: Yes     Diabetic: Yes     Tobacco smoker: No     Systolic Blood Pressure: 456 mmHg     Is BP treated: Yes     HDL Cholesterol: 52 mg/dL     Total Cholesterol: 162 mg/dL       06/17/2021    3:21 PM 11/10/2020    8:59 AM 11/04/2019    9:22 AM  Depression screen PHQ 2/9  Decreased Interest 0 0 0  Down, Depressed, Hopeless 0 0 0  PHQ - 2 Score 0 0 0      Social History   Tobacco Use  Smoking Status Never  Smokeless Tobacco Never   BP Readings from Last 3 Encounters:   09/26/21 108/78  06/17/21 114/70  06/07/21 112/70   Pulse Readings from Last 3 Encounters:  09/26/21 87  06/17/21 88  06/07/21 95   Wt Readings from Last 3 Encounters:  09/26/21 149 lb 3.2 oz (67.7 kg)  06/17/21 148 lb 12.8 oz (67.5 kg)  06/07/21 148 lb 9.6 oz (67.4 kg)   BMI Readings from Last 3 Encounters:  09/26/21 25.61 kg/m  06/17/21 25.54 kg/m  06/07/21 26.32 kg/m    Assessment/Interventions: Review of patient past medical history, allergies, medications, health status, including review of consultants reports, laboratory and other test data, was performed as part of comprehensive evaluation and provision of chronic care management services.   SDOH:  (Social Determinants of Health) assessments and interventions performed: Yes  SDOH Screenings   Alcohol Screen: Low Risk    Last Alcohol Screening Score (AUDIT): 0  Depression (PHQ2-9): Low Risk    PHQ-2 Score: 0  Financial Resource Strain: Low Risk    Difficulty of Paying Living Expenses: Not hard at all  Food Insecurity: No Food Insecurity   Worried About Charity fundraiser in the Last Year: Never true   Ran Out of Food in the Last Year: Never true  Housing: Flas[place] Risk Score: 0  Physical Activity: Insufficiently Active   Days of Exercise per Week: 5 days   Minutes of Exercise per Session: 20 min  Social Connections: Not on file  Stress: Not on file  Tobacco Use: Low Risk    Smoking Tobacco Use: Never   Smokeless Tobacco Use: Never   Passive Exposure: Not on file  Transportation Needs: Not on file    CCM Care Plan  No Known Allergies  Medications Reviewed Today     Reviewed by Glendale Chard, MD (Physician) on 09/26/21 at 1617  Med List Status: <None>   Medication Order Taking? Sig Documenting Provider Last Dose Status Informant  aspirin 81 MG tablet [id number] Yes Take 81 mg by mouth daily. [provider] Taking Active   atorvastatin (  LIPITOR) 80 MG tablet [id number] Yes  TAKE 1 TABLET BY MOUTH EVERY DAY Glendale Chard, MD Taking Active   Blood Glucose Monitoring Suppl [facility] VERIO REFLECT) w/Device KIT [id number] Yes Use as directed to check blood sugars 3 times per day dx: e11.65 Glendale Chard, MD Taking Active   dapagliflozin propanediol (FARXIGA) 10 MG TABS tablet [id number] Yes Take by mouth daily. [provider] Taking Active   dextrose 5 % solution [id number]   Mauri Pole, MD  Active   fluticasone (FLONASE) 50 MCG/ACT nasal spray [id number] Yes SPRAY 2 SPRAYS INTO EACH NOSTRIL EVERY DAY Minette Brine, FNP Taking Active   insulin degludec (TRESIBA FLEXTOUCH) 200 UNIT/ML FlexTouch Pen [id number] Yes Inject 56 Units into the skin at bedtime.  Patient taking differently: Inject 40 Units into the skin at bedtime.   Bary Castilla, NP Taking Active   Insulin Pen Needle (B-D UF III MINI PEN NEEDLES) 31G X 5 MM MISC [id number] Yes USE AS DIRECTED WITH Leodis Liverpool, MD Taking Active   lisinopril-hydrochlorothiazide (ZESTORETIC) 20-25 MG tablet [id number] Yes TAKE 1/2 TABLET BY MOUTH EVERY DAY Ghumman, Ramandeep, NP Taking Active   Multiple Vitamin (MULTIVITAMIN) tablet [id number] Yes Take 1 tablet by mouth daily. [provider] Taking Active   OneTouch Delica Lancets 95J MISC [id number] Yes Use as directed to check blood sugars 3 times per day dx: e11.65 Glendale Chard, MD Taking Active   [facility] VERIO test strip [id number] Yes USE AS DIRECTED TO CHECK BLOOD SUGARS 3 TIMES PER DAY DX: E11.65 Glendale Chard, MD Taking Active   OZEMPIC, 1 MG/DOSE, 4 MG/3ML SOPN [id number] Yes INJECT 1 MG INTO THE SKIN ONCE A WEEK. Bary Castilla, NP Taking Active             Patient Active Problem List   Diagnosis Date Noted   Vitamin D insufficiency 06/24/2021   Body mass index (BMI) of 25.0 to 25.9 in adult 06/24/2021   Pseudopolyposis of colon without complication, unspecified part of colon ([place]) 06/17/2021   Multinodular goiter  11/12/2020   Paresthesia and pain of extremity 11/12/2020   Female climacteric state 01/04/2017   Other long term (current) drug therapy 01/04/2017   Essential (primary) hypertension    Type 2 diabetes mellitus with diabetic neuropathy, unspecified (HCC)    Elevated cholesterol     Immunization History  Administered Date(s) Administered   DTaP 09/09/2013   Fluad Quad(high Dose 65+) 04/14/2021   Influenza Inj Mdck Quad Pf 03/20/2019   Influenza,inj,Quad PF,6+ Mos 04/04/2012, 02/26/2017, 03/21/2018, 03/20/2019, 04/09/2020   Influenza-Unspecified 03/21/2018   Moderna Covid-19 Vaccine Bivalent Booster 70yr & up 07/26/2021   Moderna Sars-Covid-2 Vaccination 07/08/2019, 08/05/2019, 03/26/2020, 01/10/2021   Pneumococcal Conjugate-13 05/21/2018   Pneumococcal Polysaccharide-23 05/08/2019   Tdap 05/29/2012   Zoster Recombinat (Shingrix) 11/12/2020, 06/07/2021    Conditions to be addressed/monitored:  Hyperlipidemia and Diabetes  Care [place] : [place] Updates made by [place]ayford Knife [place] 10/19/2021 12:00 AM     Problem: HLD, DM II      Long-Range Goal: Disease Management   This Visit's Progress: On track  Recent Progress: On track  Note:     Current Barriers:  Unable to independently monitor therapeutic efficacy  Pharmacist Clinical Goal(s):  Patient will verbalize ability to afford treatment regimen through collaboration with PharmD and provider.   Interventions: 1:1 collaboration with SGlendale Chard MD regarding development and update of comprehensive plan of care as evidenced by provider attestation and co-signature Inter-disciplinary  care team collaboration (see longitudinal plan of care) Comprehensive medication review performed; medication list updated in electronic medical record  Hyperlipidemia: (LDL goal < 55) -Controlled -Current treatment: Atorvastatin 80 mg tablet once per day Appropriate, Effective, Safe, Accessible -Current dietary  patterns: she is eating 3 walnuts per day, and eating more baked fish -Current exercise habits: please see diabetes -Educated on Benefits of statin for ASCVD risk reduction; Importance of limiting foods high in cholesterol; Exercise goal of 150 minutes per week; -Recommended to continue current medication  Diabetes (A1c goal <7%) -Not ideally controlled -Current medications: Ozempic 1 mg once per week. Appropriate, Effective, Safe, Accessible Tresiba Flex 200 unit/ml : Inject 56 Units into the skin at bedtime Appropriate, Effective, Safe, Accessible Patient reports that she cut back her insulin to 38 units once per day  Farxiga 10 mg tablet once per day Appropriate, Effective, Safe, Accessible -Current home glucose readings fasting glucose: 5/23 - 65, 5/22 - 79, 82, 89  her numbers are usually in the 70's - 80's -Denies hypoglycemic/hyperglycemic symptoms -Current meal patterns:  -Current exercise: she is walking with her neighbor, she was doing a short walk in the morning. Now, she is walking at least 30 minutes daily when the temperature is lower. They walk from 7:15- to 8 PM or later.  -Congratulated patient on her  decrease in A1c to 7.2  -Educated on A1c and blood sugar goals; Prevention and management of hypoglycemic episodes; -Counseled to check feet daily and get yearly eye exams -Recommended to continue current medication  Patient Goals/Self-Care Activities Patient will:  - take medications as prescribed as evidenced by patient report and record review  Follow Up Plan: The patient has been provided with contact information for the care management team and has been advised to call with any health related questions or concerns.       Medication Assistance: None required.  Patient affirms current coverage meets needs.  Compliance/Adherence/Medication fill history: Care Gaps: Ophthalmology Exam  Star-Rating Drugs: Atorvastatin 80 mg tablet Lisinopril -  Hydrochlorothiazide 20-25 mg  Farxiga 10 mg tablet Ozempic 1 mg injection   Patient's preferred pharmacy is:  [id number]- [place], [place] - [id number] [place]Phone: [phone]: [phone]Phone: [phone]: [phone] Uses pill box? Yes Pt endorses 95% compliance  We discussed: Current pharmacy is preferred with insurance plan and patient is satisfied with pharmacy services Patient decided to: Continue current medication management strategy  Care Plan and Follow Up Patient Decision:  Patient agrees to Care Plan and Follow-up.  Plan: The patient has been provided with contact information for the care management team and has been advised to call with any health related questions or concerns.   VOrlando Penner CPP, PharmD Clinical Pharmacist Practitioner [facility] [phone]

## 2021-10-19 NOTE — Patient Instructions (Signed)
Visit Information It was great speaking with you today!  Please let me know if you have any questions about our visit.   Goals Addressed             This Visit's Progress    Manage My Medicine       Timeframe:  Long-Range Goal Priority:  High Start Date:                             Expected End Date:                       Follow Up Date 04/11/2022  In Progress:   - call for medicine refill 2 or 3 days before it runs out - call if I am sick and can't take my medicine - keep a list of all the medicines I take; vitamins and herbals too    Why is this important?   These steps will help you keep on track with your medicines.   Notes:  Please call with any questions or concerns, and fill out patient assistance paperwork that was mailed to you for next year.         Patient Care Plan: CCM Pharmacy Care Plan     Problem Identified: HLD, DM II      Long-Range Goal: Disease Management   This Visit's Progress: On track  Recent Progress: On track  Note:     Current Barriers:  Unable to independently monitor therapeutic efficacy  Pharmacist Clinical Goal(s):  Patient will verbalize ability to afford treatment regimen through collaboration with PharmD and provider.   Interventions: 1:1 collaboration with Glendale Chard, MD regarding development and update of comprehensive plan of care as evidenced by provider attestation and co-signature Inter-disciplinary care team collaboration (see longitudinal plan of care) Comprehensive medication review performed; medication list updated in electronic medical record  Hyperlipidemia: (LDL goal < 55) -Controlled -Current treatment: Atorvastatin 80 mg tablet once per day Appropriate, Effective, Safe, Accessible -Current dietary patterns: she is eating 3 walnuts per day, and eating more baked fish -Current exercise habits: please see diabetes -Educated on Benefits of statin for ASCVD risk reduction; Importance of limiting foods high  in cholesterol; Exercise goal of 150 minutes per week; -Recommended to continue current medication  Diabetes (A1c goal <7%) -Not ideally controlled -Current medications: Ozempic 1 mg once per week. Appropriate, Effective, Safe, Accessible Tresiba Flex 200 unit/ml : Inject 56 Units into the skin at bedtime Appropriate, Effective, Safe, Accessible Patient reports that she cut back her insulin to 38 units once per day  Farxiga 10 mg tablet once per day Appropriate, Effective, Safe, Accessible -Current home glucose readings fasting glucose: 5/23 - 65, 5/22 - 79, 82, 89  her numbers are usually in the 70's - 80's -Denies hypoglycemic/hyperglycemic symptoms -Current meal patterns:  -Current exercise: she is walking with her neighbor, she was doing a short walk in the morning. Now, she is walking at least 30 minutes daily when the temperature is lower. They walk from 7:15- to 8 PM or later.  -Congratulated patient on her  decrease in A1c to 7.2  -Educated on A1c and blood sugar goals; Prevention and management of hypoglycemic episodes; -Counseled to check feet daily and get yearly eye exams -Recommended to continue current medication  Patient Goals/Self-Care Activities Patient will:  - take medications as prescribed as evidenced by patient report and record review  Follow Up Plan: The  patient has been provided with contact information for the care management team and has been advised to call with any health related questions or concerns.      Patient agreed to services and verbal consent obtained.   The patient verbalized understanding of instructions, educational materials, and care plan provided today and agreed to receive a mailed copy of patient instructions, educational materials, and care plan.   [name], PharmD Clinical Pharmacist [facility] [phone]

## 2021-10-20 ENCOUNTER — Ambulatory Visit (INDEPENDENT_AMBULATORY_CARE_PROVIDER_SITE_OTHER): Payer: Medicare Other | Admitting: Internal Medicine

## 2021-10-20 ENCOUNTER — Encounter: Payer: Self-pay | Admitting: Internal Medicine

## 2021-10-20 VITALS — BP 122/80 | HR 84 | Temp 97.8°F | Ht 64.4 in | Wt 147.8 lb

## 2021-10-20 DIAGNOSIS — R1013 Epigastric pain: Secondary | ICD-10-CM

## 2021-10-20 DIAGNOSIS — Z6825 Body mass index (BMI) 25.0-25.9, adult: Secondary | ICD-10-CM | POA: Diagnosis not present

## 2021-10-20 DIAGNOSIS — M7989 Other specified soft tissue disorders: Secondary | ICD-10-CM | POA: Diagnosis not present

## 2021-10-20 DIAGNOSIS — R143 Flatulence: Secondary | ICD-10-CM | POA: Diagnosis not present

## 2021-10-20 MED ORDER — FAMOTIDINE 20 MG PO TABS: 20.0000 mg | ORAL_TABLET | Freq: Every day | ORAL | 1 refills | Status: DC

## 2021-10-20 NOTE — Progress Notes (Signed)
[name],acting as a Education administrator for [name], MD.,have documented all relevant documentation on the behalf of [name], MD,as directed by  [name], MD while in the presence of [name], MD.  This visit occurred during the SARS-CoV-2 public health emergency.  Safety protocols were in place, including screening questions prior to the visit, additional usage of staff PPE, and extensive cleaning of exam room while observing appropriate contact time as indicated for disinfecting solutions.  Subjective:     Patient ID: Sylvia Kelly , female    DOB: Dec 14, 1955 , 66 y.o.   MRN: [id number]   Chief Complaint  Patient presents with   Leg Swelling    HPI  Patient presents today for leg swelling. Patient reports her left leg has been swelling for the past couple of weeks. She is not sure what may have triggered her sx. Patient reports she doesn't eat salt or cook with it,so she is unsure why she is having swelling. She denies any recent travel, long car trips. No known h/o cancer.     Past Medical History:  Diagnosis Date   Allergy    Arthritis    hip   Atrophic vaginitis    Diabetes mellitus    Elevated cholesterol    Hypertension    Neuromuscular disorder (HCC)    neuropathy hands and feet     Family History  Problem Relation Age of Onset   Heart disease Mother    Diabetes Mother    Cancer Father        Lung cancer   Colon cancer Neg Hx    Colon polyps Neg Hx    Esophageal cancer Neg Hx    Rectal cancer Neg Hx    Stomach cancer Neg Hx      Current Outpatient Medications:    aspirin 81 MG tablet, Take 81 mg by mouth daily., Disp: , Rfl:    atorvastatin (LIPITOR) 80 MG tablet, TAKE 1 TABLET BY MOUTH EVERY DAY, Disp: 90 tablet, Rfl: 1   Blood Glucose Monitoring Suppl (ONETOUCH VERIO REFLECT) w/Device KIT, Use as directed to check blood sugars 3 times per day dx: e11.65, Disp: 1 kit, Rfl: 1   dapagliflozin propanediol (FARXIGA) 10 MG TABS  tablet, Take by mouth daily., Disp: , Rfl:    famotidine (PEPCID) 20 MG tablet, Take 1 tablet (20 mg total) by mouth daily., Disp: 30 tablet, Rfl: 1   fluticasone (FLONASE) 50 MCG/ACT nasal spray, SPRAY 2 SPRAYS INTO EACH NOSTRIL EVERY DAY, Disp: 48 mL, Rfl: 1   insulin degludec (TRESIBA FLEXTOUCH) 200 UNIT/ML FlexTouch Pen, Inject 56 Units into the skin at bedtime. (Patient taking differently: Inject 40 Units into the skin at bedtime.), Disp: 27 mL, Rfl: 2   Insulin Pen Needle (B-D UF III MINI PEN NEEDLES) 31G X 5 MM MISC, USE AS DIRECTED WITH LEVEMIR, Disp: 150 each, Rfl: 3   lisinopril-hydrochlorothiazide (ZESTORETIC) 20-25 MG tablet, TAKE 1/2 TABLET BY MOUTH EVERY DAY, Disp: 45 tablet, Rfl: 2   Multiple Vitamin (MULTIVITAMIN) tablet, Take 1 tablet by mouth daily., Disp: , Rfl:    OneTouch Delica Lancets 35H MISC, Use as directed to check blood sugars 3 times per day dx: e11.65, Disp: 150 each, Rfl: 3   ONETOUCH VERIO test strip, USE AS DIRECTED TO CHECK BLOOD SUGARS 3 TIMES PER DAY DX: E11.65, Disp: 200 strip, Rfl: 3   OZEMPIC, 1 MG/DOSE, 4 MG/3ML SOPN, INJECT 1 MG INTO THE SKIN ONCE A  WEEK., Disp: 3 mL, Rfl: 2  Current Facility-Administered Medications:    dextrose 5 % solution, , Intravenous, Continuous, Nandigam, Kavitha V, MD   No Known Allergies   Review of Systems  Constitutional: Negative.   Respiratory: Negative.    Cardiovascular:  Positive for leg swelling.  Gastrointestinal: Negative.        She c/o increased flatulence. Also with increased belching/indigestion.   Neurological: Negative.   Psychiatric/Behavioral: Negative.      Today's Vitals   10/20/21 1544  BP: 122/80  Pulse: 84  Temp: 97.8 F (36.6 C)  Weight: 147 lb 12.8 oz (67 kg)  Height: 5' 4.4" (1.636 m)  PainSc: 0-No pain   Body mass index is 25.06 kg/m.  Wt Readings from Last 3 Encounters:  10/20/21 147 lb 12.8 oz (67 kg)  09/26/21 149 lb 3.2 oz (67.7 kg)  06/17/21 148 lb 12.8 oz (67.5 kg)      Objective:  Physical Exam Vitals and nursing note reviewed.  Constitutional:      Appearance: Normal appearance.  HENT:     Head: Normocephalic and atraumatic.  Cardiovascular:     Rate and Rhythm: Normal rate and regular rhythm.     Heart sounds: Normal heart sounds.  Pulmonary:     Effort: Pulmonary effort is normal.     Breath sounds: Normal breath sounds.  Abdominal:     General: Bowel sounds are normal.     Palpations: Abdomen is soft.  Musculoskeletal:     Cervical back: Normal range of motion.     Left lower leg: Edema present.     Comments: L - 13 inches, R - 12.5 inches  Skin:    General: Skin is warm.  Neurological:     General: No focal deficit present.     Mental Status: She is alert.  Psychiatric:        Mood and Affect: Mood normal.        Behavior: Behavior normal.     Assessment And Plan:     1. Left leg swelling Comments: I will refer her for LLE venous doppler. She is in agreement w/ treatment plan.  - VAS [place] LOWER EXTREMITY VENOUS (DVT); Future  2. Dyspepsia Comments: I will send rx famotidine 29m once daily. Reminded to stop eating 3 hrs prior to going to bed.   3. Flatulence Comments: Please see #2.   4. BMI 25.0-25.9,adult Comments: She is encouraged to aim for at least 150 min of exercise per week.    Patient was given opportunity to ask questions. Patient verbalized understanding of the plan and was able to repeat key elements of the plan. All questions were answered to their satisfaction.   I, RMaximino Greenland MD, have reviewed all documentation for this visit. The documentation on 10/20/21 for the exam, diagnosis, procedures, and orders are all accurate and complete.   IF YOU HAVE BEEN REFERRED TO A SPECIALIST, IT MAY TAKE 1-2 WEEKS TO SCHEDULE/PROCESS THE REFERRAL. IF YOU HAVE NOT HEARD FROM US/SPECIALIST IN TWO WEEKS, PLEASE GIVE [place] CALL AT [phone].   THE PATIENT IS ENCOURAGED TO PRACTICE SOCIAL DISTANCING DUE TO THE  COVID-19 PANDEMIC.

## 2021-10-26 DIAGNOSIS — E785 Hyperlipidemia, unspecified: Secondary | ICD-10-CM | POA: Diagnosis not present

## 2021-10-26 DIAGNOSIS — E1140 Type 2 diabetes mellitus with diabetic neuropathy, unspecified: Secondary | ICD-10-CM

## 2021-10-26 DIAGNOSIS — Z7985 Long-term (current) use of injectable non-insulin antidiabetic drugs: Secondary | ICD-10-CM | POA: Diagnosis not present

## 2021-10-26 DIAGNOSIS — Z794 Long term (current) use of insulin: Secondary | ICD-10-CM

## 2021-10-26 DIAGNOSIS — E7800 Pure hypercholesterolemia, unspecified: Secondary | ICD-10-CM

## 2021-10-28 ENCOUNTER — Telehealth: Payer: Self-pay

## 2021-10-28 NOTE — Chronic Care Management (AMB) (Signed)
Chronic Care Management Pharmacy Assistant   Name: Sylvia Kelly  MRN: [id number] DOB: 05/05/1956  Reason for Encounter: Disease State/ Diabetes  Recent office visits:  10-20-2021 Glendale Chard, MD. Visit for left leg swelling. Order placed for VAS [place] LOWER EXTREMITY VENOUS (DVT). START famotidine 20 mg daily.  Recent consult visits:  None  Hospital visits:  None in previous 6 months  Medications: Outpatient Encounter Medications as of 10/28/2021  Medication Sig   aspirin 81 MG tablet Take 81 mg by mouth daily.   atorvastatin (LIPITOR) 80 MG tablet TAKE 1 TABLET BY MOUTH EVERY DAY   Blood Glucose Monitoring Suppl (ONETOUCH VERIO REFLECT) w/Device KIT Use as directed to check blood sugars 3 times per day dx: e11.65   dapagliflozin propanediol (FARXIGA) 10 MG TABS tablet Take by mouth daily.   famotidine (PEPCID) 20 MG tablet Take 1 tablet (20 mg total) by mouth daily.   fluticasone (FLONASE) 50 MCG/ACT nasal spray SPRAY 2 SPRAYS INTO EACH NOSTRIL EVERY DAY   insulin degludec (TRESIBA FLEXTOUCH) 200 UNIT/ML FlexTouch Pen Inject 56 Units into the skin at bedtime. (Patient taking differently: Inject 40 Units into the skin at bedtime.)   Insulin Pen Needle (B-D UF III MINI PEN NEEDLES) 31G X 5 MM MISC USE AS DIRECTED WITH LEVEMIR   lisinopril-hydrochlorothiazide (ZESTORETIC) 20-25 MG tablet TAKE 1/2 TABLET BY MOUTH EVERY DAY   Multiple Vitamin (MULTIVITAMIN) tablet Take 1 tablet by mouth daily.   OneTouch Delica Lancets 34V MISC Use as directed to check blood sugars 3 times per day dx: e11.65   ONETOUCH VERIO test strip USE AS DIRECTED TO CHECK BLOOD SUGARS 3 TIMES PER DAY DX: E11.65   OZEMPIC, 1 MG/DOSE, 4 MG/3ML SOPN INJECT 1 MG INTO THE SKIN ONCE A WEEK.   Facility-Administered Encounter Medications as of 10/28/2021  Medication   dextrose 5 % solution  Recent Relevant Labs: Lab Results  Component Value Date/Time   HGBA1C 7.2 (H) 09/26/2021 04:44 PM   HGBA1C 7.9 (H)  06/29/2021 11:15 AM   HGBA1C 7.7 01/16/2018 12:00 AM   MICROALBUR 30 11/10/2020 12:48 PM   MICROALBUR 30 11/04/2019 11:00 AM    Kidney Function Lab Results  Component Value Date/Time   CREATININE 0.91 09/26/2021 04:44 PM   CREATININE 0.89 06/17/2021 04:24 PM   GFRNONAA 75 06/09/2020 11:01 AM   GFRAA 86 06/09/2020 11:01 AM    Current antihyperglycemic regimen:  Tresiba 40 units at bedtime Ozempic 1 mg weekly Farxiga 10 mg daily  What recent interventions/DTPs have been made to improve glycemic control:  Educated on A1c and blood sugar goals; Prevention and management of hypoglycemic episodes; -Counseled to check feet daily and get yearly eye exams -Recommended to continue current medication  Have there been any recent hospitalizations or ED visits since last visit with [facility]? No  Patient denies hypoglycemic symptoms  Patient denies hyperglycemic symptoms  How often are you checking your blood sugar? once daily  What are your blood sugars ranging?  Fasting: 101, 133, 85 Before meals: None After meals: None Bedtime: None  During the week, how often does your blood glucose drop below 70? Never  Are you checking your feet daily/regularly? Daily  Adherence Review: Is the patient currently on a STATIN medication? Yes Is the patient currently on ACE/ARB medication? Yes Does the patient have >5 day gap between last estimated fill dates? No   Care Gaps: Yearly ophthalmology overdue AWV 07-06-2022  Star Rating Drugs: Atorvastatin 80 mg- Last filled 10-25-2021  [facility] Farxiga 10 mg- Patient assistance Lisinopril/HCTZ 20-25 mg- Last filled 09-02-2021 [facility] Ozempic 1 mg- Patient assistance  [place] Pharmacist Assistant [phone]

## 2021-11-09 NOTE — Chronic Care Management (AMB) (Signed)
11-09-2021: Patient was informed medication has arrived at office and ready for pickup. Patient stated she will pick medication up today.  [place] Pharmacist Assistant [phone]

## 2021-11-11 ENCOUNTER — Telehealth: Payer: Self-pay

## 2021-11-11 NOTE — Chronic Care Management (AMB) (Cosign Needed)
[employer]ovo [employer] patient assistance program notification: November 01, 2021  120- day supply of Tresiba 200 u/ml and Ozempic '1mg'$   should arrive to the office in 10-14 business days. Patient has 1  refill remaining and enrollment will expire on 04/27/2022.  The next refill for patient will be fulfilled on 01/18/2022.  [name], [place] Pharmacist Assistant [phone]

## 2021-11-18 ENCOUNTER — Telehealth: Payer: Self-pay

## 2021-11-20 ENCOUNTER — Other Ambulatory Visit: Payer: Self-pay | Admitting: Internal Medicine

## 2021-11-28 NOTE — Chronic Care Management (AMB) (Signed)
AZ&Me Notification:  [name] 10 mg was shipped on 11/17/2021, allow 1-2 business days for shipment to arrive. Shipment was sent via [employer], Tracking number [id number].  [name], [place] Pharmacist Assistant [phone]

## 2021-12-05 ENCOUNTER — Encounter (HOSPITAL_COMMUNITY): Payer: Self-pay

## 2021-12-05 ENCOUNTER — Ambulatory Visit (HOSPITAL_COMMUNITY)
Admission: EM | Admit: 2021-12-05 | Discharge: 2021-12-05 | Disposition: A | Discharge status: Home / Self Care | Payer: Medicare Other | Attending: Physician Assistant | Admitting: Physician Assistant

## 2021-12-05 DIAGNOSIS — H938X2 Other specified disorders of left ear: Secondary | ICD-10-CM

## 2021-12-05 DIAGNOSIS — H6982 Other specified disorders of Eustachian tube, left ear: Secondary | ICD-10-CM

## 2021-12-05 MED ORDER — FLUTICASONE PROPIONATE 50 MCG/ACT NA SUSP
NASAL | 0 refills | Status: DC
Start: 2021-12-05 — End: 2023-07-25

## 2021-12-05 NOTE — Discharge Instructions (Signed)
I believe that you have eustachian tube dysfunction.  Start over-the-counter antihistamine such as cetirizine.  Use Flonase daily.  If your symptoms or not improving I recommend you follow-up with an ENT; call to schedule an appointment.  If anything worsens please return for reevaluation.

## 2021-12-05 NOTE — ED Provider Notes (Signed)
[facility]    CSN: [id number] Arrival date & time: 12/05/21  1423      History   Chief Complaint Chief Complaint  Patient presents with   Otalgia    HPI Sylvia Kelly is a 66 y.o. female.   Patient presents today with a 1 day history of left ear fullness and discomfort.  She denies any specific pain but does report a popping/unusual sound in the ear occasionally.  She has not identified any trigger.  She has not tried any over-the-counter medication for symptom management.  She initially did apply some peroxide but this provided only minimal relief.  She reports going swimming several days ago.  She does not use earbuds, earplugs, Q-tips.  Denies any recent travel or barotrauma.  Denies any recent illness including cough, congestion, fever, nausea, vomiting.  She denies formal diagnosis of allergies and is not taking any antihistamines on a regular basis.  No additional complaints or concerns today.    Past Medical History:  Diagnosis Date   Allergy    Arthritis    hip   Atrophic vaginitis    Diabetes mellitus    Elevated cholesterol    Hypertension    Neuromuscular disorder ([place])    neuropathy hands and feet    Patient Active Problem List   Diagnosis Date Noted   Vitamin D insufficiency 06/24/2021   Body mass index (BMI) of 25.0 to 25.9 in adult 06/24/2021   Pseudopolyposis of colon without complication, unspecified part of colon ([place]) 06/17/2021   Multinodular goiter 11/12/2020   Paresthesia and pain of extremity 11/12/2020   Female climacteric state 01/04/2017   Other long term (current) drug therapy 01/04/2017   Essential (primary) hypertension    Type 2 diabetes mellitus with diabetic neuropathy, unspecified (HCC)    Elevated cholesterol     Past Surgical History:  Procedure Laterality Date   COLONOSCOPY     10 yrs ago- [employer] - normal per pt   OVARIAN CYST SURGERY  05/29/1990   REFRACTIVE SURGERY     TUBAL LIGATION      OB History      Gravida  2   Para  1   Term  1   Preterm      AB  1   Living  1      SAB      IAB      Ectopic      Multiple      Live Births               Home Medications    Prior to Admission medications   Medication Sig Start Date End Date Taking? Authorizing Provider  famotidine (PEPCID) 20 MG tablet TAKE 1 TABLET BY MOUTH EVERY DAY 11/21/21   Glendale Chard, MD  aspirin 81 MG tablet Take 81 mg by mouth daily.    [provider]  atorvastatin (LIPITOR) 80 MG tablet TAKE 1 TABLET BY MOUTH EVERY DAY 09/02/21   Glendale Chard, MD  Blood Glucose Monitoring Suppl (ONETOUCH VERIO REFLECT) w/Device KIT Use as directed to check blood sugars 3 times per day dx: e11.65 01/19/20   Glendale Chard, MD  dapagliflozin propanediol (FARXIGA) 10 MG TABS tablet Take by mouth daily.    [provider]  fluticasone (FLONASE) 50 MCG/ACT nasal spray SPRAY 1 SPRAY INTO EACH NOSTRIL EVERY DAY 12/05/21   Noheli Melder K, PA-C  insulin degludec (TRESIBA FLEXTOUCH) 200 UNIT/ML FlexTouch Pen Inject 56 Units into the  skin at bedtime. Patient taking differently: Inject 40 Units into the skin at bedtime. 06/09/20   Bary Castilla, NP  Insulin Pen Needle (B-D UF III MINI PEN NEEDLES) 31G X 5 MM MISC USE AS DIRECTED WITH LEVEMIR 07/23/20   Glendale Chard, MD  lisinopril-hydrochlorothiazide (ZESTORETIC) 20-25 MG tablet TAKE 1/2 TABLET BY MOUTH EVERY DAY 06/06/21   Bary Castilla, NP  Multiple Vitamin (MULTIVITAMIN) tablet Take 1 tablet by mouth daily.    [provider]  OneTouch Delica Lancets 27C MISC Use as directed to check blood sugars 3 times per day dx: e11.65 09/06/20   Glendale Chard, MD  [facility] VERIO test strip USE AS DIRECTED TO CHECK BLOOD SUGARS 3 TIMES PER DAY DX: E11.65 09/22/20   Glendale Chard, MD  OZEMPIC, 1 MG/DOSE, 4 MG/3ML SOPN INJECT 1 MG INTO THE SKIN ONCE A WEEK. 11/22/20   Bary Castilla, NP    Family History Family History  Problem Relation Age of  Onset   Heart disease Mother    Diabetes Mother    Cancer Father        Lung cancer   Colon cancer Neg Hx    Colon polyps Neg Hx    Esophageal cancer Neg Hx    Rectal cancer Neg Hx    Stomach cancer Neg Hx     Social History Social History   Tobacco Use   Smoking status: Never   Smokeless tobacco: Never  Vaping Use   Vaping Use: Never used  Substance Use Topics   Alcohol use: No   Drug use: No     Allergies   Patient has no known allergies.   Review of Systems Review of Systems  Constitutional:  Negative for activity change, appetite change, fatigue and fever.  HENT:  Positive for ear pain (Fullness). Negative for congestion, ear discharge, hearing loss, sinus pressure, sneezing and sore throat.   Respiratory:  Negative for cough and shortness of breath.   Cardiovascular:  Negative for chest pain.  Neurological:  Negative for dizziness, light-headedness and headaches.     Physical Exam Triage Vital Signs ED Triage Vitals  Enc Vitals Group     BP 12/05/21 0916 101/67     Pulse Rate 12/05/21 0916 80     Resp 12/05/21 0916 18     Temp 12/05/21 0916 98.5 F (36.9 C)     Temp Source 12/05/21 0916 Oral     SpO2 12/05/21 0916 96 %     Weight --      Height --      Head Circumference --      Peak Flow --      Pain Score 12/05/21 0917 4     Pain Loc --      Pain Edu? --      Excl. in [place]? --    No data found.  Updated Vital Signs BP 101/67 (BP Location: Left Arm)   Pulse 80   Temp 98.5 F (36.9 C) (Oral)   Resp 18   SpO2 96%   Visual Acuity Right Eye Distance:   Left Eye Distance:   Bilateral Distance:    Right Eye Near:   Left Eye Near:    Bilateral Near:     Physical Exam Vitals reviewed.  Constitutional:      General: She is awake. She is not in acute distress.    Appearance: Normal appearance. She is well-developed. She is not ill-appearing.     Comments: Very pleasant female appears stated  age in no acute distress sitting comfortably in  exam room  HENT:     Head: Normocephalic and atraumatic.     Right Ear: Tympanic membrane, ear canal and external ear normal. Tympanic membrane is not erythematous or bulging.     Left Ear: Ear canal and external ear normal. A middle ear effusion is present. Tympanic membrane is not erythematous or bulging.     Ears:     Comments: Left ear: Small amount of cerumen noted in ear canal without evidence of impaction; able to visualize approximately 75% of TM.    Nose:     Right Sinus: No maxillary sinus tenderness or frontal sinus tenderness.     Left Sinus: No maxillary sinus tenderness or frontal sinus tenderness.     Mouth/Throat:     Pharynx: Uvula midline. Posterior oropharyngeal erythema present. No oropharyngeal exudate.     Comments: Erythema and drainage in posterior oropharynx Cardiovascular:     Rate and Rhythm: Normal rate and regular rhythm.     Heart sounds: Normal heart sounds, S1 normal and S2 normal. No murmur heard. Pulmonary:     Effort: Pulmonary effort is normal.     Breath sounds: Normal breath sounds. No wheezing, rhonchi or rales.     Comments: Clear to auscultation bilaterally Lymphadenopathy:     Head:     Right side of head: No submental, submandibular or tonsillar adenopathy.     Left side of head: No submental, submandibular or tonsillar adenopathy.     Cervical: No cervical adenopathy.  Psychiatric:        Behavior: Behavior is cooperative.      UC Treatments / Results  Labs (all labs ordered are listed, but only abnormal results are displayed) Labs Reviewed - No data to display  EKG   Radiology No results found.  Procedures Procedures (including critical care time)  Medications Ordered in UC Medications - No data to display  Initial Impression / Assessment and Plan / UC Course  I have reviewed the triage vital signs and the nursing notes.  Pertinent labs & imaging results that were available during my care of the patient were reviewed by  me and considered in my medical decision making (see chart for details).     No evidence of infection on physical exam that would warrant initiation of antibiotics.  Discussed likely eustachian tube dysfunction as etiology of symptoms.  Patient was encouraged to use daily antihistamine such as cetirizine as well as Flonase for symptom relief.  Discussed that if her symptoms are not improving she should follow-up with the ENT and was given contact information for local provider with instruction to call to schedule appointment.  If she has any worsening symptoms including otorrhea, increased pain/discomfort, fever, nausea, vomiting she needs to be seen immediately.  Strict return precautions given.  Work excuse note provided.  Final Clinical Impressions(s) / UC Diagnoses   Final diagnoses:  Dysfunction of left eustachian tube  Sensation of fullness in left ear     Discharge Instructions      I believe that you have eustachian tube dysfunction.  Start over-the-counter antihistamine such as cetirizine.  Use Flonase daily.  If your symptoms or not improving I recommend you follow-up with an ENT; call to schedule an appointment.  If anything worsens please return for reevaluation.     ED Prescriptions     Medication Sig Dispense Auth. Provider   fluticasone (FLONASE) 50 MCG/ACT nasal spray SPRAY 1 SPRAY INTO [facility]  NOSTRIL EVERY DAY 16 g Zionah Criswell K, PA-C      PDMP not reviewed this encounter.   Terrilee Croak, PA-C 12/05/21 4883

## 2021-12-05 NOTE — ED Triage Notes (Signed)
Pt c/o lt ear pain that woke her up in her sleep this morning. States was in a pool on Saturday. States used peroxide with some relief.

## 2021-12-24 ENCOUNTER — Other Ambulatory Visit: Payer: Self-pay | Admitting: Internal Medicine

## 2022-01-13 ENCOUNTER — Other Ambulatory Visit: Payer: Self-pay | Admitting: Internal Medicine

## 2022-01-13 DIAGNOSIS — Z1231 Encounter for screening mammogram for malignant neoplasm of breast: Secondary | ICD-10-CM

## 2022-01-17 ENCOUNTER — Encounter: Payer: Self-pay | Admitting: Internal Medicine

## 2022-01-31 ENCOUNTER — Telehealth: Payer: Self-pay

## 2022-01-31 NOTE — Chronic Care Management (AMB) (Signed)
Novo [employer]isk patient assistance program notification:  120- day supply of Tresiba 200 u/ml and Ozempic 1 mg will be filled on 01/27/2022 and should arrive to the office in 10-14 business days.    AZ&Me Notification:  [name] 10 mg was shipped on 01/16/2022, allow 1-2 business days for shipment to arrive. Shipment was sent via [employer], Tracking number [id number].   [name], [place] Pharmacist Assistant [phone]

## 2022-02-01 ENCOUNTER — Ambulatory Visit (INDEPENDENT_AMBULATORY_CARE_PROVIDER_SITE_OTHER): Payer: Medicare Other | Admitting: Internal Medicine

## 2022-02-01 ENCOUNTER — Encounter: Payer: Self-pay | Admitting: Internal Medicine

## 2022-02-01 VITALS — BP 102/68 | HR 87 | Temp 98.3°F | Ht 64.4 in | Wt 147.6 lb

## 2022-02-01 DIAGNOSIS — Z6825 Body mass index (BMI) 25.0-25.9, adult: Secondary | ICD-10-CM

## 2022-02-01 DIAGNOSIS — E1140 Type 2 diabetes mellitus with diabetic neuropathy, unspecified: Secondary | ICD-10-CM | POA: Diagnosis not present

## 2022-02-01 DIAGNOSIS — Z794 Long term (current) use of insulin: Secondary | ICD-10-CM

## 2022-02-01 DIAGNOSIS — I10 Essential (primary) hypertension: Secondary | ICD-10-CM | POA: Diagnosis not present

## 2022-02-01 DIAGNOSIS — E7800 Pure hypercholesterolemia, unspecified: Secondary | ICD-10-CM

## 2022-02-01 DIAGNOSIS — R253 Fasciculation: Secondary | ICD-10-CM

## 2022-02-01 DIAGNOSIS — E042 Nontoxic multinodular goiter: Secondary | ICD-10-CM

## 2022-02-01 DIAGNOSIS — R143 Flatulence: Secondary | ICD-10-CM | POA: Diagnosis not present

## 2022-02-01 MED ORDER — PANTOPRAZOLE SODIUM 20 MG PO TBEC: 20.0000 mg | DELAYED_RELEASE_TABLET | Freq: Every day | ORAL | 1 refills | Status: DC

## 2022-02-01 NOTE — Progress Notes (Signed)
[name],acting as a Education administrator for [name], MD.,have documented all relevant documentation on the behalf of [name], MD,as directed by  [name], MD while in the presence of [name], MD.    Subjective:     Patient ID: Sylvia Kelly , female    DOB: 1956-03-11 , 66 y.o.   MRN: [id number]   Chief Complaint  Patient presents with   Diabetes   Hypertension    HPI  She presents today for DM/HTN follow-up.  She reports compliance with meds.  She denies headaches, chest pain and shortness of breath.   Diabetes She presents for her follow-up diabetic visit. She has type 2 diabetes mellitus. Her disease course has been improving. There are no hypoglycemic associated symptoms. Pertinent negatives for diabetes include no blurred vision, no chest pain, no fatigue, no polydipsia, no polyphagia and no polyuria. There are no hypoglycemic complications. Diabetic complications include peripheral neuropathy. Risk factors for coronary artery disease include diabetes mellitus, dyslipidemia, sedentary lifestyle, hypertension and post-menopausal.  Hypertension This is a chronic problem. The current episode started more than 1 year ago. The problem has been gradually improving since onset. Pertinent negatives include no blurred vision, chest pain, palpitations or shortness of breath.     Past Medical History:  Diagnosis Date   Allergy    Arthritis    hip   Atrophic vaginitis    Diabetes mellitus    Elevated cholesterol    Hypertension    Neuromuscular disorder (HCC)    neuropathy hands and feet     Family History  Problem Relation Age of Onset   Heart disease Mother    Diabetes Mother    Cancer Father        Lung cancer   Colon cancer Neg Hx    Colon polyps Neg Hx    Esophageal cancer Neg Hx    Rectal cancer Neg Hx    Stomach cancer Neg Hx      Current Outpatient Medications:    aspirin 81 MG tablet, Take 81 mg by mouth daily., Disp: , Rfl:     atorvastatin (LIPITOR) 80 MG tablet, TAKE 1 TABLET BY MOUTH EVERY DAY, Disp: 90 tablet, Rfl: 1   Blood Glucose Monitoring Suppl (ONETOUCH VERIO REFLECT) w/Device KIT, Use as directed to check blood sugars 3 times per day dx: e11.65, Disp: 1 kit, Rfl: 1   dapagliflozin propanediol (FARXIGA) 10 MG TABS tablet, Take by mouth daily., Disp: , Rfl:    fluticasone (FLONASE) 50 MCG/ACT nasal spray, SPRAY 1 SPRAY INTO EACH NOSTRIL EVERY DAY, Disp: 16 g, Rfl: 0   insulin degludec (TRESIBA FLEXTOUCH) 200 UNIT/ML FlexTouch Pen, Inject 56 Units into the skin at bedtime. (Patient taking differently: Inject 40 Units into the skin at bedtime.), Disp: 27 mL, Rfl: 2   lisinopril-hydrochlorothiazide (ZESTORETIC) 20-25 MG tablet, TAKE 1/2 TABLET BY MOUTH EVERY DAY, Disp: 45 tablet, Rfl: 2   Multiple Vitamin (MULTIVITAMIN) tablet, Take 1 tablet by mouth daily., Disp: , Rfl:    OZEMPIC, 1 MG/DOSE, 4 MG/3ML SOPN, INJECT 1 MG INTO THE SKIN ONCE A WEEK., Disp: 3 mL, Rfl: 2   pantoprazole (PROTONIX) 20 MG tablet, Take 1 tablet (20 mg total) by mouth daily., Disp: 30 tablet, Rfl: 1   hydrOXYzine (ATARAX) 10 MG tablet, 1-2 tabs po qhs prn itching, Disp: 30 tablet, Rfl: 0   Insulin Pen Needle (B-D UF III MINI PEN NEEDLES) 31G X 5 MM MISC, USE AS DIRECTED  WITH LEVEMIR, Disp: 150 each, Rfl: 3   OneTouch Delica Lancets 35T MISC, Use as directed to check blood sugars 3 times per day dx: e11.65, Disp: 150 each, Rfl: 3   ONETOUCH VERIO test strip, Use as instructed, Disp: 200 strip, Rfl: 3   triamcinolone cream (KENALOG) 0.1 %, APPLY TO AFFECTED AREA TWICE DAILY AS NEEDED, Disp: 30 g, Rfl: 1  Current Facility-Administered Medications:    dextrose 5 % solution, , Intravenous, Continuous, Nandigam, Kavitha V, MD   No Known Allergies   Review of Systems  Constitutional: Negative.  Negative for fatigue.  Eyes:  Negative for blurred vision.  Respiratory: Negative.  Negative for shortness of breath.   Cardiovascular: Negative.   Negative for chest pain and palpitations.  Gastrointestinal: Negative.        She c/o increased flatulence and belching. She denies change in her appetite/food intake.   Endocrine: Negative for polydipsia, polyphagia and polyuria.  Musculoskeletal: Negative.        She c/o lip twitching. She is not sure what is contributing to her sx. Occurs in top lip. She denies having any lip pain and/or numbness.  Skin: Negative.   Neurological: Negative.   Psychiatric/Behavioral: Negative.       Today's Vitals   02/01/22 1543  BP: 102/68  Pulse: 87  Temp: 98.3 F (36.8 C)  Weight: 147 lb 9.6 oz (67 kg)  Height: 5' 4.4" (1.636 m)  PainSc: 0-No pain   Body mass index is 25.02 kg/m.  Wt Readings from Last 3 Encounters:  02/15/22 151 lb 12.8 oz (68.9 kg)  02/01/22 147 lb 9.6 oz (67 kg)  10/20/21 147 lb 12.8 oz (67 kg)     Objective:  Physical Exam Vitals and nursing note reviewed.  Constitutional:      Appearance: Normal appearance.  HENT:     Head: Normocephalic and atraumatic.  Eyes:     Extraocular Movements: Extraocular movements intact.  Cardiovascular:     Rate and Rhythm: Normal rate and regular rhythm.     Heart sounds: Normal heart sounds.  Pulmonary:     Effort: Pulmonary effort is normal.     Breath sounds: Normal breath sounds.  Musculoskeletal:     Cervical back: Normal range of motion.  Skin:    General: Skin is warm.  Neurological:     General: No focal deficit present.     Mental Status: She is alert.  Psychiatric:        Mood and Affect: Mood normal.        Behavior: Behavior normal.         Assessment And Plan:     1. Type 2 diabetes mellitus with diabetic neuropathy, with long-term current use of insulin (HCC) Comments: Chronic, I will check labs as below. I will adjust meds as needed, she will rto in 3-4 months.  - CMP14+EGFR - CBC - Lipid panel - Hemoglobin A1c  2. Essential (primary) hypertension Comments: Chronic, well controlled. She will  c/w lisinopril/hct 20/39m daily. Encouraged to follow a low sodium diet.  - CMP14+EGFR - Lipid panel  3. Fasciculations Comments: located in her upper lip. I will check CMP and Mg levels today.  - Magnesium  4. Flatulence Comments: D/c pepcid. I will start pantoprazole 27mdaily. She will let me know if her sx persist. Encouraged to decrease dairy intake and other known triggers.   5. Pure hypercholesterolemia Comments: Chronic, LDL goal <80. Advised to c/w atorvastatin 8045maily.   6.  Multinodular goiter Comments: She needs annual thyroid ultrasound for five years, next due May 2024. Most recent u/s reviewed in detail.   7. BMI 25.0-25.9,adult Comments: She is encouraged to aim for at least 150 minutes of exercise per week.    Patient was given opportunity to ask questions. Patient verbalized understanding of the plan and was able to repeat key elements of the plan. All questions were answered to their satisfaction.   I, [name], MD, have reviewed all documentation for this visit. The documentation on 02/01/22 for the exam, diagnosis, procedures, and orders are all accurate and complete.   IF YOU HAVE BEEN REFERRED TO A SPECIALIST, IT MAY TAKE 1-2 WEEKS TO SCHEDULE/PROCESS THE REFERRAL. IF YOU HAVE NOT HEARD FROM US/SPECIALIST IN TWO WEEKS, PLEASE GIVE [place] A CALL AT [phone] X [phone].   THE PATIENT IS ENCOURAGED TO PRACTICE SOCIAL DISTANCING DUE TO THE COVID-19 PANDEMIC.

## 2022-02-01 NOTE — Patient Instructions (Signed)

## 2022-02-02 LAB — CMP14+EGFR
ALT: 23 IU/L (ref 0–32)
AST: 24 IU/L (ref 0–40)
Albumin/Globulin Ratio: 1.6 (ref 1.2–2.2)
Albumin: 4.6 g/dL (ref 3.9–4.9)
Alkaline Phosphatase: 117 IU/L (ref 44–121)
BUN/Creatinine Ratio: 16 (ref 12–28)
BUN: 17 mg/dL (ref 8–27)
Bilirubin Total: 0.3 mg/dL (ref 0.0–1.2)
CO2: 26 mmol/L (ref 20–29)
Calcium: 9.3 mg/dL (ref 8.7–10.3)
Chloride: 100 mmol/L (ref 96–106)
Creatinine, Ser: 1.06 mg/dL — ABNORMAL HIGH (ref 0.57–1.00)
Globulin, Total: 2.8 g/dL (ref 1.5–4.5)
Glucose: 182 mg/dL — ABNORMAL HIGH (ref 70–99)
Potassium: 4.1 mmol/L (ref 3.5–5.2)
Sodium: 141 mmol/L (ref 134–144)
Total Protein: 7.4 g/dL (ref 6.0–8.5)
eGFR: 58 mL/min/{1.73_m2} — ABNORMAL LOW (ref >59)

## 2022-02-02 LAB — CBC
Hematocrit: 39.9 % (ref 34.0–46.6)
Hemoglobin: 13.3 g/dL (ref 11.1–15.9)
MCH: 29.4 pg (ref 26.6–33.0)
MCHC: 33.3 g/dL (ref 31.5–35.7)
MCV: 88 fL (ref 79–97)
Platelets: 312 10*3/uL (ref 150–450)
RBC: 4.53 x10E6/uL (ref 3.77–5.28)
RDW: 12.5 % (ref 11.7–15.4)
WBC: 4.9 10*3/uL (ref 3.4–10.8)

## 2022-02-02 LAB — LIPID PANEL
Chol/HDL Ratio: 2.7 ratio (ref 0.0–4.4)
Cholesterol, Total: 168 mg/dL (ref 100–199)
HDL: 63 mg/dL (ref >39)
LDL Chol Calc (NIH): 84 mg/dL (ref 0–99)
Triglycerides: 121 mg/dL (ref 0–149)
VLDL Cholesterol Cal: 21 mg/dL (ref 5–40)

## 2022-02-02 LAB — HEMOGLOBIN A1C
Est. average glucose Bld gHb Est-mCnc: 166 mg/dL
Hgb A1c MFr Bld: 7.4 % — ABNORMAL HIGH (ref 4.8–5.6)

## 2022-02-02 LAB — MAGNESIUM: Magnesium: 2.8 mg/dL — ABNORMAL HIGH (ref 1.6–2.3)

## 2022-02-03 ENCOUNTER — Encounter: Payer: Self-pay | Admitting: Internal Medicine

## 2022-02-07 LAB — HM DIABETES EYE EXAM

## 2022-02-08 ENCOUNTER — Telehealth: Payer: Self-pay

## 2022-02-08 ENCOUNTER — Other Ambulatory Visit: Payer: Self-pay | Admitting: Internal Medicine

## 2022-02-08 NOTE — Chronic Care Management (AMB) (Addendum)
[employer] patient assistance program notification:  120- day supply of Ozempic 1 mg and Tresiba 200 u/ml was filled on 02/01/2022 and should arrive to the office in 10-14 business days. Patient has 0  refill remaining and enrollment will expire on 04/27/2022.  The next refill for patient will be fulfilled on 04/20/2022,  Reorder form filled out to receive refills before 2023 enrollment year.   Received provider signature and faxed reorder form.   [name], [place] Pharmacist Assistant [phone]

## 2022-02-09 ENCOUNTER — Encounter: Payer: Self-pay | Admitting: Internal Medicine

## 2022-02-15 ENCOUNTER — Ambulatory Visit (INDEPENDENT_AMBULATORY_CARE_PROVIDER_SITE_OTHER): Payer: Medicare Other | Admitting: Internal Medicine

## 2022-02-15 ENCOUNTER — Encounter: Payer: Self-pay | Admitting: Internal Medicine

## 2022-02-15 ENCOUNTER — Other Ambulatory Visit: Payer: Self-pay

## 2022-02-15 VITALS — BP 120/70 | HR 83 | Temp 98.0°F | Ht 64.4 in | Wt 151.8 lb

## 2022-02-15 DIAGNOSIS — Z6825 Body mass index (BMI) 25.0-25.9, adult: Secondary | ICD-10-CM

## 2022-02-15 DIAGNOSIS — L299 Pruritus, unspecified: Secondary | ICD-10-CM | POA: Diagnosis not present

## 2022-02-15 DIAGNOSIS — R21 Rash and other nonspecific skin eruption: Secondary | ICD-10-CM

## 2022-02-15 MED ORDER — HYDROXYZINE HCL 10 MG PO TABS: ORAL_TABLET | ORAL | 0 refills | Status: DC

## 2022-02-15 MED ORDER — TRIAMCINOLONE ACETONIDE 0.1 % EX CREA: TOPICAL_CREAM | CUTANEOUS | 1 refills | Status: DC

## 2022-02-15 MED ORDER — TRIAMCINOLONE ACETONIDE 0.1 % EX CREA: TOPICAL_CREAM | CUTANEOUS | Status: DC

## 2022-02-15 NOTE — Progress Notes (Signed)
[name],acting as a Education administrator for [name], MD.,have documented all relevant documentation on the behalf of [name], MD,as directed by  [name], MD while in the presence of [name], MD.    Subjective:     Patient ID: Sylvia Kelly , female    DOB: Nov 11, 1955 , 66 y.o.   MRN: [id number]   Chief Complaint  Patient presents with   Rash    HPI  Patient presents today for a rash on her arm. She first noticed it about five days ago.  It is described as red and itchy. She states there are little red bumps. The rash is located on her right arm. She denies use of new soaps, detergents and lotions. She is positive that area did not hurt prior to seeing the rash. No burning sensation. Does not have a garden, but sits with a lady that has herbs/plants in a garden box. She did apply rubbing alcohol on the rash yesterday morning. She did not notice any blisters.     Rash This is a new problem. The current episode started in the past 7 days. The problem is unchanged. The affected locations include the right arm. The rash is characterized by itchiness. She was exposed to nothing. Treatments tried: rubbing alcohol. The treatment provided no relief.     Past Medical History:  Diagnosis Date   Allergy    Arthritis    hip   Atrophic vaginitis    Diabetes mellitus    Elevated cholesterol    Hypertension    Neuromuscular disorder (HCC)    neuropathy hands and feet     Family History  Problem Relation Age of Onset   Heart disease Mother    Diabetes Mother    Cancer Father        Lung cancer   Colon cancer Neg Hx    Colon polyps Neg Hx    Esophageal cancer Neg Hx    Rectal cancer Neg Hx    Stomach cancer Neg Hx      Current Outpatient Medications:    aspirin 81 MG tablet, Take 81 mg by mouth daily., Disp: , Rfl:    atorvastatin (LIPITOR) 80 MG tablet, TAKE 1 TABLET BY MOUTH EVERY DAY, Disp: 90 tablet, Rfl: 1   Blood Glucose Monitoring Suppl  (ONETOUCH VERIO REFLECT) w/Device KIT, Use as directed to check blood sugars 3 times per day dx: e11.65, Disp: 1 kit, Rfl: 1   dapagliflozin propanediol (FARXIGA) 10 MG TABS tablet, Take by mouth daily., Disp: , Rfl:    fluticasone (FLONASE) 50 MCG/ACT nasal spray, SPRAY 1 SPRAY INTO EACH NOSTRIL EVERY DAY, Disp: 16 g, Rfl: 0   insulin degludec (TRESIBA FLEXTOUCH) 200 UNIT/ML FlexTouch Pen, Inject 56 Units into the skin at bedtime. (Patient taking differently: Inject 40 Units into the skin at bedtime.), Disp: 27 mL, Rfl: 2   lisinopril-hydrochlorothiazide (ZESTORETIC) 20-25 MG tablet, TAKE 1/2 TABLET BY MOUTH EVERY DAY, Disp: 45 tablet, Rfl: 2   Multiple Vitamin (MULTIVITAMIN) tablet, Take 1 tablet by mouth daily., Disp: , Rfl:    OZEMPIC, 1 MG/DOSE, 4 MG/3ML SOPN, INJECT 1 MG INTO THE SKIN ONCE A WEEK., Disp: 3 mL, Rfl: 2   hydrOXYzine (ATARAX) 10 MG tablet, 1-2 TABS AT BEDTIME AS NEEDED FOR ITCHING, Disp: 180 tablet, Rfl: 1   Insulin Pen Needle (B-D UF III MINI PEN NEEDLES) 31G X 5 MM MISC, USE AS DIRECTED WITH LEVEMIR, Disp: 150 each, Rfl:  3   OneTouch Delica Lancets 46K MISC, Use as directed to check blood sugars 3 times per day dx: e11.65, Disp: 150 each, Rfl: 3   ONETOUCH VERIO test strip, Use as instructed, Disp: 200 strip, Rfl: 3   pantoprazole (PROTONIX) 20 MG tablet, TAKE 1 TABLET BY MOUTH EVERY DAY, Disp: 30 tablet, Rfl: 1   triamcinolone cream (KENALOG) 0.1 %, APPLY TO AFFECTED AREA TWICE DAILY AS NEEDED, Disp: 30 g, Rfl: 1  Current Facility-Administered Medications:    dextrose 5 % solution, , Intravenous, Continuous, Nandigam, Kavitha V, MD   No Known Allergies   Review of Systems  Constitutional: Negative.   Respiratory: Negative.    Cardiovascular: Negative.   Gastrointestinal: Negative.   Skin:  Positive for rash.  Neurological: Negative.   Psychiatric/Behavioral: Negative.       Today's Vitals   02/15/22 1448  BP: 120/70  Pulse: 83  Temp: 98 F (36.7 C)  Weight:  151 lb 12.8 oz (68.9 kg)  Height: 5' 4.4" (1.636 m)  PainSc: 0-No pain   Body mass index is 25.73 kg/m.  Wt Readings from Last 3 Encounters:  02/15/22 151 lb 12.8 oz (68.9 kg)  02/01/22 147 lb 9.6 oz (67 kg)  10/20/21 147 lb 12.8 oz (67 kg)     Objective:  Physical Exam Vitals and nursing note reviewed.  Constitutional:      Appearance: Normal appearance.  HENT:     Head: Normocephalic and atraumatic.  Cardiovascular:     Rate and Rhythm: Normal rate and regular rhythm.     Heart sounds: Normal heart sounds.  Pulmonary:     Effort: Pulmonary effort is normal.     Breath sounds: Normal breath sounds.  Musculoskeletal:     Cervical back: Normal range of motion.  Skin:    General: Skin is warm.          Comments: Scattered maculopapular rash on right forearm. No vesicular lesions noted.   Neurological:     General: No focal deficit present.     Mental Status: She is alert.  Psychiatric:        Mood and Affect: Mood normal.        Behavior: Behavior normal.         Assessment And Plan:     1. Rash and nonspecific skin eruption Comments: Does not appear to be vesicular. I will send rx triamcinolone cream to affected area bid prn. She will let me know if her sx persist.   2. Pruritus Comments: I will send rx hydroxyzine to use nightly prn.   3. BMI 25.0-25.9,adult Comments: She is encouraged to aim for at least 150 minutes of exercise per week.    Patient was given opportunity to ask questions. Patient verbalized understanding of the plan and was able to repeat key elements of the plan. All questions were answered to their satisfaction.   I, [name], MD, have reviewed all documentation for this visit. The documentation on 02/15/22 for the exam, diagnosis, procedures, and orders are all accurate and complete.   IF YOU HAVE BEEN REFERRED TO A SPECIALIST, IT MAY TAKE 1-2 WEEKS TO SCHEDULE/PROCESS THE REFERRAL. IF YOU HAVE NOT HEARD FROM US/SPECIALIST IN TWO  WEEKS, PLEASE GIVE [place] A CALL AT [phone].   THE PATIENT IS ENCOURAGED TO PRACTICE SOCIAL DISTANCING DUE TO THE COVID-19 PANDEMIC.

## 2022-02-16 ENCOUNTER — Encounter: Payer: Self-pay | Admitting: Internal Medicine

## 2022-02-17 ENCOUNTER — Other Ambulatory Visit: Payer: Self-pay

## 2022-02-17 MED ORDER — ONETOUCH DELICA LANCETS 33G MISC: 3 refills | Status: DC

## 2022-02-17 MED ORDER — ONETOUCH VERIO VI STRP: ORAL_STRIP | 3 refills | Status: DC

## 2022-02-17 MED ORDER — BD PEN NEEDLE MINI U/F 31G X 5 MM MISC: 3 refills | Status: DC

## 2022-02-22 ENCOUNTER — Other Ambulatory Visit: Payer: Self-pay | Admitting: Internal Medicine

## 2022-02-24 ENCOUNTER — Other Ambulatory Visit: Payer: Self-pay | Admitting: Internal Medicine

## 2022-02-24 ENCOUNTER — Encounter: Payer: Self-pay | Admitting: Internal Medicine

## 2022-02-27 ENCOUNTER — Ambulatory Visit: Payer: Medicare Other

## 2022-03-03 ENCOUNTER — Ambulatory Visit
Admission: RE | Admit: 2022-03-03 | Discharge: 2022-03-03 | Disposition: A | Discharge status: Home / Self Care | Payer: Medicare Other | Source: Ambulatory Visit | Attending: Internal Medicine | Admitting: Internal Medicine

## 2022-03-03 DIAGNOSIS — Z1231 Encounter for screening mammogram for malignant neoplasm of breast: Secondary | ICD-10-CM

## 2022-03-15 ENCOUNTER — Encounter: Payer: Self-pay | Admitting: Internal Medicine

## 2022-03-15 ENCOUNTER — Telehealth: Payer: Self-pay

## 2022-03-15 ENCOUNTER — Other Ambulatory Visit: Payer: Self-pay | Admitting: Internal Medicine

## 2022-03-15 NOTE — Chronic Care Management (AMB) (Signed)
03-15-2022: Patient was informed that Ozempic and tresiba has arrived and ready for pickup.  [place] Pharmacist Assistant [phone]

## 2022-03-15 NOTE — Chronic Care Management (AMB) (Signed)
[place]&ME Notification:  Patient approved for 2024 Enrollment with [name] Patient Assistance Program for [place] 10 mg, enrollment will end on May 29, 2023.    [name], [place] Pharmacist Assistant [phone]

## 2022-04-07 ENCOUNTER — Other Ambulatory Visit: Payer: Self-pay | Admitting: Internal Medicine

## 2022-04-07 ENCOUNTER — Telehealth: Payer: Self-pay

## 2022-04-07 NOTE — Chronic Care Management (AMB) (Signed)
04-07-2022: Rescheduled patient's appointment on 04-11-2022 with [name] due to provider being unavailable. Patient was rescheduled to December.  [place] Pharmacist Assistant [phone]

## 2022-04-11 ENCOUNTER — Telehealth: Payer: Medicare Other

## 2022-04-14 ENCOUNTER — Other Ambulatory Visit: Payer: Self-pay | Admitting: Internal Medicine

## 2022-05-03 ENCOUNTER — Telehealth: Payer: Self-pay

## 2022-05-03 NOTE — Progress Notes (Signed)
    Sylvia Kelly was reminded to have all medications, supplements and any blood glucose and blood pressure readings available for review with [name], Pharm. D, at her telephone visit on 05-05-2022 at 9:00   Questions: Have you had any recent office visit or specialist visit outside of [place]? Patient stated no  Are there any concerns you would like to discuss during your office visit? Patient stated no  Are you having any problems obtaining your medications? (Whether it pharmacy issues or cost) Patient stated no  If patient has any PAP medications ask if they are having any problems getting their PAP medication or refill? Patient stated no  Care Gaps: Covid booster overdue  Star Rating Drug: Ozempic- PAP Farxiga- PAP Atorvastatin 80 mg- Last filled 04-14-2022 [facility] Lisinopril/HCTZ 20-25 mg- Last filled 04-28-2022 [facility]  Any gaps in medications fill history? No  [place] Pharmacist Assistant [phone]

## 2022-05-05 ENCOUNTER — Telehealth: Payer: Self-pay

## 2022-05-05 ENCOUNTER — Telehealth: Payer: Medicare Other

## 2022-05-05 NOTE — Progress Notes (Deleted)
Current Barriers:  {pharmacybarriers:24917}  Pharmacist Clinical Goal(s):  Patient will {PHARMACYGOALCHOICES:24921} through collaboration with PharmD and provider.   Interventions: 1:1 collaboration with Glendale Chard, MD regarding development and update of comprehensive plan of care as evidenced by provider attestation and co-signature Inter-disciplinary care team collaboration (see longitudinal plan of care) Comprehensive medication review performed; medication list updated in electronic medical record  {CCM [facility] DISEASE STATES:25130}  Patient Goals/Self-Care Activities Patient will:  - {pharmacypatientgoals:24919}  Follow Up Plan: {CM FOLLOW UP WPYK:99833}  Chronic Care Management Pharmacy Note  05/05/2022 Name:  Sylvia Kelly MRN:  [id number] DOB:  1955/12/07  Summary: ***  Recommendations/Changes made from today's visit: ***  Plan: ***   Subjective: Sylvia Kelly is an 66 y.o. year old female who is a primary patient of Glendale Chard, MD.  The CCM team was consulted for assistance with disease management and care coordination needs.    {CCMTELEPHONEFACETOFACE:21091510} for {CCMINITIALFOLLOWUPCHOICE:21091511} in response to provider referral for pharmacy case management and/or care coordination services.   Consent to Services:  {CCMCONSENTOPTIONS:25074}  Patient Care Team: Glendale Chard, MD as PCP - General (Internal Medicine) Mayford Knife, [facility] (Pharmacist)  Recent office visits: ***  Recent consult visits: [facility] visits: {Hospital DC Yes/No:25215}   Objective:  Lab Results  Component Value Date   CREATININE 1.06 (H) 02/01/2022   BUN 17 02/01/2022   EGFR 58 (L) 02/01/2022   GFRNONAA 75 06/09/2020   GFRAA 86 06/09/2020   NA 141 02/01/2022   K 4.1 02/01/2022   CALCIUM 9.3 02/01/2022   CO2 26 02/01/2022   GLUCOSE 182 (H) 02/01/2022    Lab Results  Component Value Date/Time   HGBA1C 7.4 (H) 02/01/2022 05:00 PM   HGBA1C  7.2 (H) 09/26/2021 04:44 PM   HGBA1C 7.7 01/16/2018 12:00 AM   MICROALBUR 30 11/10/2020 12:48 PM   MICROALBUR 30 11/04/2019 11:00 AM    Last diabetic Eye exam:  Lab Results  Component Value Date/Time   HMDIABEYEEXA No Retinopathy 02/07/2022 12:00 AM    Last diabetic Foot exam: No results found for: "HMDIABFOOTEX"   Lab Results  Component Value Date   CHOL 168 02/01/2022   HDL 63 02/01/2022   LDLCALC 84 02/01/2022   TRIG 121 02/01/2022   CHOLHDL 2.7 02/01/2022       Latest Ref Rng & Units 02/01/2022    5:00 PM 06/17/2021    4:24 PM 04/14/2021    3:55 PM  Hepatic Function  Total Protein 6.0 - 8.5 g/dL 7.4  7.0  6.5   Albumin 3.9 - 4.9 g/dL 4.6  4.7  4.1   AST 0 - 40 IU/L _0 ALT 0 - 32 IU/L _1 Alk Phosphatase 44 - 121 IU/L 117  125  99   Total Bilirubin 0.0 - 1.2 mg/dL 0.3  0.2  0.3     Lab Results  Component Value Date/Time   TSH 0.60 10/10/2017 12:00 AM       Latest Ref Rng & Units 02/01/2022    5:00 PM 06/17/2021    4:24 PM 11/04/2019   10:08 AM  CBC  WBC 3.4 - 10.8 x10E3/uL 4.9  4.6  3.6   Hemoglobin 11.1 - 15.9 g/dL 13.3  12.8  12.1   Hematocrit 34.0 - 46.6 % 39.9  38.1  36.8   Platelets 150 - 450 x10E3/uL 312  305  285     Lab Results  Component Value  Date/Time   VD25OH 20.6 (L) 06/17/2021 04:24 PM   VITAMINB12 509 06/17/2021 04:24 PM    Clinical ASCVD: {YES/NO:21197} The 10-year ASCVD risk score (Arnett DK, et al., 2019) is: 16%   Values used to calculate the score:     Age: 66 years     Sex: Female     Is Non-Hispanic African American: Yes     Diabetic: Yes     Tobacco smoker: No     Systolic Blood Pressure: 347 mmHg     Is BP treated: Yes     HDL Cholesterol: 63 mg/dL     Total Cholesterol: 168 mg/dL       06/17/2021    3:21 PM 11/10/2020    8:59 AM 11/04/2019    9:22 AM  Depression screen PHQ 2/9  Decreased Interest 0 0 0  Down, Depressed, Hopeless 0 0 0  PHQ - 2 Score 0 0 0     ***Other: (CHADS2VASc if Afib, MMRC or CAT  for COPD, ACT, DEXA)  Social History   Tobacco Use  Smoking Status Never  Smokeless Tobacco Never   BP Readings from Last 3 Encounters:  02/15/22 120/70  02/01/22 102/68  12/05/21 101/67   Pulse Readings from Last 3 Encounters:  02/15/22 83  02/01/22 87  12/05/21 80   Wt Readings from Last 3 Encounters:  02/15/22 151 lb 12.8 oz (68.9 kg)  02/01/22 147 lb 9.6 oz (67 kg)  10/20/21 147 lb 12.8 oz (67 kg)   BMI Readings from Last 3 Encounters:  02/15/22 25.73 kg/m  02/01/22 25.02 kg/m  10/20/21 25.06 kg/m    Assessment/Interventions: Review of patient past medical history, allergies, medications, health status, including review of consultants reports, laboratory and other test data, was performed as part of comprehensive evaluation and provision of chronic care management services.   SDOH:  (Social Determinants of Health) assessments and interventions performed: {yes/no:20286} SDOH Interventions    Flowsheet Row Office Visit from 06/17/2021 in [facility]  SDOH Interventions   Food Insecurity Interventions Intervention Not Indicated  Housing Interventions Intervention Not Indicated  Financial Strain Interventions Intervention Not Indicated  Physical Activity Interventions --  [Plans to increase time spent exercising]      SDOH Screenings   Food Insecurity: No Food Insecurity (06/17/2021)  Housing: Low Risk  (06/17/2021)  Alcohol Screen: Low Risk  (06/17/2021)  Depression (PHQ2-9): Low Risk  (06/17/2021)  Financial Resource Strain: Low Risk  (06/17/2021)  Physical Activity: Insufficiently Active (06/17/2021)  Tobacco Use: Low Risk  (02/15/2022)    [place]  No Known Allergies  Medications Reviewed Today     Reviewed by Glendale Chard, MD (Physician) on 02/15/22 at 1526  Med List Status: <None>   Medication Order Taking? Sig Documenting Provider Last Dose Status Informant  aspirin 81 MG tablet [id number] Yes Take 81 mg by mouth daily.  [provider] Taking Active   atorvastatin (LIPITOR) 80 MG tablet [id number] Yes TAKE 1 TABLET BY MOUTH EVERY DAY Glendale Chard, MD Taking Active   Blood Glucose Monitoring Suppl [facility] VERIO REFLECT) w/Device KIT [id number] Yes Use as directed to check blood sugars 3 times per day dx: e11.65 Glendale Chard, MD Taking Active   dapagliflozin propanediol (FARXIGA) 10 MG TABS tablet [id number] Yes Take by mouth daily. [provider] Taking Active   dextrose 5 % solution [id number]   Mauri Pole, MD  Active   fluticasone (FLONASE) 50 MCG/ACT nasal spray [id number] Yes SPRAY 1 SPRAY  INTO EACH NOSTRIL EVERY DAY Raspet, Erin K, PA-C Taking Active   hydrOXYzine (ATARAX) 10 MG tablet [id number] Yes 1-2 tabs po qhs prn itching Glendale Chard, MD  Active   insulin degludec (TRESIBA FLEXTOUCH) 200 UNIT/ML FlexTouch Pen [id number] Yes Inject 56 Units into the skin at bedtime.  Patient taking differently: Inject 40 Units into the skin at bedtime.   Bary Castilla, NP Taking Active   Insulin Pen Needle (B-D UF III MINI PEN NEEDLES) 31G X 5 MM MISC [id number] Yes USE AS DIRECTED WITH Leodis Liverpool, MD Taking Active   lisinopril-hydrochlorothiazide (ZESTORETIC) 20-25 MG tablet [id number] Yes TAKE 1/2 TABLET BY MOUTH EVERY DAY Ghumman, Ramandeep, NP Taking Active   Multiple Vitamin (MULTIVITAMIN) tablet [id number] Yes Take 1 tablet by mouth daily. [provider] Taking Active   OneTouch Delica Lancets 10X MISC [id number] Yes Use as directed to check blood sugars 3 times per day dx: e11.65 Glendale Chard, MD Taking Active   [facility] VERIO test strip [id number] Yes USE AS DIRECTED TO CHECK BLOOD SUGARS 3 TIMES PER DAY DX: E11.65 Glendale Chard, MD Taking Active   OZEMPIC, 1 MG/DOSE, 4 MG/3ML SOPN [id number] Yes INJECT 1 MG INTO THE SKIN ONCE A WEEK. Bary Castilla, NP Taking Active   pantoprazole (PROTONIX) 20 MG tablet [id number] Yes Take 1 tablet (20 mg total) by mouth  daily. Glendale Chard, MD Taking Active             Patient Active Problem List   Diagnosis Date Noted   Vitamin D insufficiency 06/24/2021   Body mass index (BMI) of 25.0 to 25.9 in adult 06/24/2021   Pseudopolyposis of colon without complication, unspecified part of colon ([place]) 06/17/2021   Multinodular goiter 11/12/2020   Paresthesia and pain of extremity 11/12/2020   Female climacteric state 01/04/2017   Other long term (current) drug therapy 01/04/2017   Essential (primary) hypertension    Type 2 diabetes mellitus with diabetic neuropathy, unspecified (HCC)    Elevated cholesterol     Immunization History  Administered Date(s) Administered   DTaP 09/09/2013   Fluad Quad(high Dose 65+) 04/14/2021   Influenza Inj Mdck Quad Pf 03/20/2019   Influenza,inj,Quad PF,6+ Mos 04/04/2012, 02/26/2017, 03/21/2018, 03/20/2019, 04/09/2020   Influenza-Unspecified 03/21/2018, 01/28/2022   Moderna Covid-19 Vaccine Bivalent Booster 66yr & up 07/26/2021   Moderna Sars-Covid-2 Vaccination 07/08/2019, 08/05/2019, 03/26/2020, 01/10/2021   Pneumococcal Conjugate-13 05/21/2018   Pneumococcal Polysaccharide-23 05/08/2019   Tdap 05/29/2012   Zoster Recombinat (Shingrix) 11/12/2020, 06/07/2021    Conditions to be addressed/monitored:  {USCCMDZASSESSMENTOPTIONS:23563}  There are no care plans that you recently modified to display for this patient.    Medication Assistance: {MEDASSISTANCEINFO:25044}  Compliance/Adherence/Medication fill history: Care Gaps: ***  Star-Rating Drugs: ***  Patient's preferred pharmacy is:  [id number] [place], [place] - [id number] [id number]: [phone]: [phone] [address] [address]hone: [phone]: [phone] pill box? {Yes or  If no, why not?:20788} Pt endorses ***% compliance  We discussed: {Pharmacy options:24294} Patient decided to: {US Pharmacy Plan:23885}  Care Plan and Follow Up Patient Decision:  {FOLLOWUP:24991}  Plan: {CM FOLLOW UP PLAN:25073}  ***

## 2022-05-05 NOTE — Telephone Encounter (Signed)
  Care Management   Follow Up Note   05/05/2022 Name: Sylvia Kelly MRN: [id number] DOB: September 10, 1955   Referred by: Glendale Chard, MD Reason for referral : No chief complaint on file.   An unsuccessful telephone outreach was attempted today. The patient was referred to the case management team for assistance with care management and care coordination.   Follow Up Plan: The patient has been provided with contact information for the care management team and has been advised to call with any health related questions or concerns.   [name], CPP, PharmD Clinical Pharmacist Practitioner [facility] [phone]

## 2022-05-10 ENCOUNTER — Ambulatory Visit (INDEPENDENT_AMBULATORY_CARE_PROVIDER_SITE_OTHER): Payer: Medicare Other | Admitting: Internal Medicine

## 2022-05-10 ENCOUNTER — Encounter: Payer: Self-pay | Admitting: Internal Medicine

## 2022-05-10 VITALS — BP 128/74 | HR 83 | Temp 98.2°F | Ht 64.4 in | Wt 154.0 lb

## 2022-05-10 DIAGNOSIS — Z6826 Body mass index (BMI) 26.0-26.9, adult: Secondary | ICD-10-CM

## 2022-05-10 DIAGNOSIS — I10 Essential (primary) hypertension: Secondary | ICD-10-CM | POA: Diagnosis not present

## 2022-05-10 DIAGNOSIS — E663 Overweight: Secondary | ICD-10-CM | POA: Diagnosis not present

## 2022-05-10 DIAGNOSIS — E1140 Type 2 diabetes mellitus with diabetic neuropathy, unspecified: Secondary | ICD-10-CM | POA: Diagnosis not present

## 2022-05-10 DIAGNOSIS — Z794 Long term (current) use of insulin: Secondary | ICD-10-CM | POA: Diagnosis not present

## 2022-05-10 DIAGNOSIS — E7800 Pure hypercholesterolemia, unspecified: Secondary | ICD-10-CM | POA: Diagnosis not present

## 2022-05-10 NOTE — Progress Notes (Signed)
Sylvia Kelly,acting as a Education administrator for Sylvia Greenland, MD.,have documented all relevant documentation on the behalf of Sylvia Greenland, MD,as directed by  Sylvia Greenland, MD while in the presence of Sylvia Greenland, MD.    Subjective:     Patient ID: Sylvia Kelly , female    DOB: January 28, 1956 , 66 y.o.   MRN: [id number]   Chief Complaint  Patient presents with   Diabetes    HPI  She presents today for DM/HTN follow-up.  She reports compliance with meds.  She denies headaches, chest pain and shortness of breath.   Diabetes She presents for her follow-up diabetic visit. She has type 2 diabetes mellitus. Her disease course has been improving. There are no hypoglycemic associated symptoms. Pertinent negatives for diabetes include no blurred vision, no chest pain, no fatigue, no polydipsia, no polyphagia and no polyuria. There are no hypoglycemic complications. Diabetic complications include peripheral neuropathy. Risk factors for coronary artery disease include diabetes mellitus, dyslipidemia, sedentary lifestyle, hypertension and post-menopausal.  Hypertension This is a chronic problem. The current episode started more than 1 year ago. The problem has been gradually improving since onset. Pertinent negatives include no blurred vision, chest pain, palpitations or shortness of breath.     Past Medical History:  Diagnosis Date   Allergy    Arthritis    hip   Atrophic vaginitis    Diabetes mellitus    Elevated cholesterol    Hypertension    Neuromuscular disorder (HCC)    neuropathy hands and feet     Family History  Problem Relation Age of Onset   Heart disease Mother    Diabetes Mother    Cancer Father        Lung cancer   Colon cancer Neg Hx    Colon polyps Neg Hx    Esophageal cancer Neg Hx    Rectal cancer Neg Hx    Stomach cancer Neg Hx      Current Outpatient Medications:    aspirin 81 MG tablet, Take 81 mg by mouth daily., Disp: , Rfl:    atorvastatin  (LIPITOR) 80 MG tablet, TAKE 1 TABLET BY MOUTH EVERY DAY, Disp: 90 tablet, Rfl: 1   Blood Glucose Monitoring Suppl (ONETOUCH VERIO REFLECT) w/Device KIT, Use as directed to check blood sugars 3 times per day dx: e11.65, Disp: 1 kit, Rfl: 1   dapagliflozin propanediol (FARXIGA) 10 MG TABS tablet, Take by mouth daily., Disp: , Rfl:    fluticasone (FLONASE) 50 MCG/ACT nasal spray, SPRAY 1 SPRAY INTO EACH NOSTRIL EVERY DAY, Disp: 16 g, Rfl: 0   hydrOXYzine (ATARAX) 10 MG tablet, 1-2 TABS AT BEDTIME AS NEEDED FOR ITCHING, Disp: 180 tablet, Rfl: 1   insulin degludec (TRESIBA FLEXTOUCH) 200 UNIT/ML FlexTouch Pen, Inject 56 Units into the skin at bedtime. (Patient taking differently: Inject 40 Units into the skin at bedtime.), Disp: 27 mL, Rfl: 2   Insulin Pen Needle (B-D UF III MINI PEN NEEDLES) 31G X 5 MM MISC, USE AS DIRECTED WITH LEVEMIR, Disp: 150 each, Rfl: 3   lisinopril-hydrochlorothiazide (ZESTORETIC) 20-25 MG tablet, TAKE 1/2 TABLET BY MOUTH EVERY DAY, Disp: 45 tablet, Rfl: 2   Multiple Vitamin (MULTIVITAMIN) tablet, Take 1 tablet by mouth daily., Disp: , Rfl:    OneTouch Delica Lancets 11S MISC, Use as directed to check blood sugars 3 times per day dx: e11.65, Disp: 150 each, Rfl: 3   ONETOUCH VERIO test strip, Use as instructed, Disp: 200 strip,  Rfl: 3   OZEMPIC, 1 MG/DOSE, 4 MG/3ML SOPN, INJECT 1 MG INTO THE SKIN ONCE A WEEK., Disp: 3 mL, Rfl: 2   pantoprazole (PROTONIX) 20 MG tablet, TAKE 1 TABLET BY MOUTH EVERY DAY, Disp: 90 tablet, Rfl: 1   triamcinolone cream (KENALOG) 0.1 %, APPLY TO AFFECTED AREA TWICE DAILY AS NEEDED, Disp: 30 g, Rfl: 1  Current Facility-Administered Medications:    dextrose 5 % solution, , Intravenous, Continuous, Nandigam, Kavitha V, MD   No Known Allergies   Review of Systems  Constitutional:  Negative for fatigue.  Eyes: Negative.  Negative for blurred vision.  Respiratory:  Negative for shortness of breath.   Cardiovascular:  Negative for chest pain and  palpitations.  Endocrine: Negative for polydipsia, polyphagia and polyuria.     There were no vitals filed for this visit. There is no height or weight on file to calculate BMI.   Objective:  Physical Exam      Assessment And Plan:     1. Type 2 diabetes mellitus with diabetic neuropathy, with long-term current use of insulin ([place])  2. Essential (primary) hypertension     Patient was given opportunity to ask questions. Patient verbalized understanding of the plan and was able to repeat key elements of the plan. All questions were answered to their satisfaction.  Sheppard Evens Kelly, CMA   I, Warren, CMA, have reviewed all documentation for this visit. The documentation on 05/10/22 for the exam, diagnosis, procedures, and orders are all accurate and complete.   IF YOU HAVE BEEN REFERRED TO A SPECIALIST, IT MAY TAKE 1-2 WEEKS TO SCHEDULE/PROCESS THE REFERRAL. IF YOU HAVE NOT HEARD FROM US/SPECIALIST IN TWO WEEKS, PLEASE GIVE Sylvia Kelly A CALL AT [phone] X [phone].   THE PATIENT IS ENCOURAGED TO PRACTICE SOCIAL DISTANCING DUE TO THE COVID-19 PANDEMIC.

## 2022-05-10 NOTE — Patient Instructions (Signed)

## 2022-05-10 NOTE — Progress Notes (Signed)
I,Tianna Badgett,acting as a Education administrator for [name], MD.,have documented all relevant documentation on the behalf of [name], MD,as directed by  [name], MD while in the presence of [name], MD.  Subjective:     Patient ID: Sylvia Kelly , female    DOB: August 15, 1955 , 66 y.o.   MRN: [id number]   Chief Complaint  Patient presents with   Diabetes   Hypertension    HPI  She presents today for DM/HTN follow-up.  She reports compliance with meds.  She denies headaches, chest pain and shortness of breath. She admits to having low bs. She states she is eating regularly. She states her sugars are between 60s-80s.   Today, patient states that she is experiencing ear fullness. She denies fever/chills. Denies ill contacts.  Diabetes She presents for her follow-up diabetic visit. She has type 2 diabetes mellitus. Her disease course has been improving. There are no hypoglycemic associated symptoms. Pertinent negatives for diabetes include no blurred vision, no chest pain, no fatigue, no polydipsia, no polyphagia and no polyuria. There are no hypoglycemic complications. Diabetic complications include peripheral neuropathy. Risk factors for coronary artery disease include diabetes mellitus, dyslipidemia, sedentary lifestyle, hypertension and post-menopausal.  Hypertension This is a chronic problem. The current episode started more than 1 year ago. The problem has been gradually improving since onset. Pertinent negatives include no blurred vision, chest pain, palpitations or shortness of breath.     Past Medical History:  Diagnosis Date   Allergy    Arthritis    hip   Atrophic vaginitis    Diabetes mellitus    Elevated cholesterol    Hypertension    Neuromuscular disorder (HCC)    neuropathy hands and feet     Family History  Problem Relation Age of Onset   Heart disease Mother    Diabetes Mother    Cancer Father        Lung cancer   Colon cancer Neg Hx     Colon polyps Neg Hx    Esophageal cancer Neg Hx    Rectal cancer Neg Hx    Stomach cancer Neg Hx      Current Outpatient Medications:    aspirin 81 MG tablet, Take 81 mg by mouth daily., Disp: , Rfl:    atorvastatin (LIPITOR) 80 MG tablet, TAKE 1 TABLET BY MOUTH EVERY DAY, Disp: 90 tablet, Rfl: 1   Blood Glucose Monitoring Suppl (ONETOUCH VERIO REFLECT) w/Device KIT, Use as directed to check blood sugars 3 times per day dx: e11.65, Disp: 1 kit, Rfl: 1   dapagliflozin propanediol (FARXIGA) 10 MG TABS tablet, Take by mouth daily., Disp: , Rfl:    fluticasone (FLONASE) 50 MCG/ACT nasal spray, SPRAY 1 SPRAY INTO EACH NOSTRIL EVERY DAY, Disp: 16 g, Rfl: 0   hydrOXYzine (ATARAX) 10 MG tablet, 1-2 TABS AT BEDTIME AS NEEDED FOR ITCHING, Disp: 180 tablet, Rfl: 1   insulin degludec (TRESIBA FLEXTOUCH) 200 UNIT/ML FlexTouch Pen, Inject 56 Units into the skin at bedtime. (Patient taking differently: Inject 38 Units into the skin at bedtime.), Disp: 27 mL, Rfl: 2   Insulin Pen Needle (B-D UF III MINI PEN NEEDLES) 31G X 5 MM MISC, USE AS DIRECTED WITH LEVEMIR, Disp: 150 each, Rfl: 3   lisinopril-hydrochlorothiazide (ZESTORETIC) 20-25 MG tablet, TAKE 1/2 TABLET BY MOUTH EVERY DAY, Disp: 45 tablet, Rfl: 2   Multiple Vitamin (MULTIVITAMIN) tablet, Take 1 tablet by mouth daily., Disp: , Rfl:  OneTouch Delica Lancets 33L MISC, Use as directed to check blood sugars 3 times per day dx: e11.65, Disp: 150 each, Rfl: 3   ONETOUCH VERIO test strip, Use as instructed, Disp: 200 strip, Rfl: 3   OZEMPIC, 1 MG/DOSE, 4 MG/3ML SOPN, INJECT 1 MG INTO THE SKIN ONCE A WEEK., Disp: 3 mL, Rfl: 2   pantoprazole (PROTONIX) 20 MG tablet, TAKE 1 TABLET BY MOUTH EVERY DAY, Disp: 90 tablet, Rfl: 1   triamcinolone cream (KENALOG) 0.1 %, APPLY TO AFFECTED AREA TWICE DAILY AS NEEDED, Disp: 30 g, Rfl: 1  Current Facility-Administered Medications:    dextrose 5 % solution, , Intravenous, Continuous, Nandigam, Kavitha V, MD   No Known  Allergies   Review of Systems  Constitutional: Negative.  Negative for fatigue.  HENT:  Positive for ear pain.        She c/o left ear pain. Described as a "fullness". She denies having decreased hearing. She denies having any drainage. She denies fever/chills. Also reports having cold sx - sinus congestion along with PND. She has had some relief with Delsym syrup, Zyrtec and Flonase. Denies ill contacts.  Eyes:  Negative for blurred vision.  Respiratory: Negative.  Negative for shortness of breath.   Cardiovascular: Negative.  Negative for chest pain and palpitations.  Gastrointestinal: Negative.   Endocrine: Negative for polydipsia, polyphagia and polyuria.  Neurological: Negative.      Today's Vitals   05/10/22 1608  BP: 128/74  Pulse: 83  Temp: 98.2 F (36.8 C)  TempSrc: Oral  Weight: 154 lb (69.9 kg)  Height: 5' 4.4" (1.636 m)   Body mass index is 26.11 kg/m.    Wt Readings from Last 3 Encounters:  05/10/22 154 lb (69.9 kg)  02/15/22 151 lb 12.8 oz (68.9 kg)  02/01/22 147 lb 9.6 oz (67 kg)    Objective:  Physical Exam Vitals and nursing note reviewed.  Constitutional:      Appearance: Normal appearance.  HENT:     Head: Normocephalic and atraumatic.     Right Ear: Tympanic membrane, ear canal and external ear normal. There is no impacted cerumen.     Left Ear: Tympanic membrane, ear canal and external ear normal. There is no impacted cerumen.     Nose:     Comments: Masked    Mouth/Throat:     Comments: Masked  Eyes:     Extraocular Movements: Extraocular movements intact.  Cardiovascular:     Rate and Rhythm: Normal rate and regular rhythm.     Heart sounds: Normal heart sounds.  Pulmonary:     Effort: Pulmonary effort is normal.     Breath sounds: Normal breath sounds.  Musculoskeletal:     Cervical back: Normal range of motion.  Skin:    General: Skin is warm.  Neurological:     General: No focal deficit present.     Mental Status: She is alert.   Psychiatric:        Mood and Affect: Mood normal.        Behavior: Behavior normal.      Assessment And Plan:     1. Type 2 diabetes mellitus with diabetic neuropathy, with long-term current use of insulin (HCC) Comments: Chronic, I will decrease her [name] dosage to 34 units nightly. She will c/w [place] and Ozempic.  Seh will f/u in 3 months. - Hemoglobin A1c - BMP8+EGFR  2. Essential (primary) hypertension Comments: Chronic, controlled. She will c/w lisinopril/hctz 20/78m daily. I will check renal  function today.  3. Pure hypercholesterolemia Comments: Chronic, LDL goal <70. She will c/w atorvastatin 42m daily.  4. Overweight with body mass index (BMI) of 26 to 26.9 in adult Comments: She is encouraged to aim for at least 150 minutes of exercise per week.     Patient was given opportunity to ask questions. Patient verbalized understanding of the plan and was able to repeat key elements of the plan. All questions were answered to their satisfaction.   I, RMaximino Greenland MD, have reviewed all documentation for this visit. The documentation on 05/10/22 for the exam, diagnosis, procedures, and orders are all accurate and complete.   IF YOU HAVE BEEN REFERRED TO A SPECIALIST, IT MAY TAKE 1-2 WEEKS TO SCHEDULE/PROCESS THE REFERRAL. IF YOU HAVE NOT HEARD FROM US/SPECIALIST IN TWO WEEKS, PLEASE GIVE [place] CALL AT [phone] X [phone].   THE PATIENT IS ENCOURAGED TO PRACTICE SOCIAL DISTANCING DUE TO THE COVID-19 PANDEMIC.

## 2022-05-11 LAB — BMP8+EGFR
BUN/Creatinine Ratio: 16 (ref 12–28)
BUN: 15 mg/dL (ref 8–27)
CO2: 26 mmol/L (ref 20–29)
Calcium: 9.4 mg/dL (ref 8.7–10.3)
Chloride: 101 mmol/L (ref 96–106)
Creatinine, Ser: 0.95 mg/dL (ref 0.57–1.00)
Glucose: 67 mg/dL — ABNORMAL LOW (ref 70–99)
Potassium: 4.4 mmol/L (ref 3.5–5.2)
Sodium: 140 mmol/L (ref 134–144)
eGFR: 66 mL/min/{1.73_m2} (ref >59)

## 2022-05-11 LAB — HEMOGLOBIN A1C
Est. average glucose Bld gHb Est-mCnc: 151 mg/dL
Hgb A1c MFr Bld: 6.9 % — ABNORMAL HIGH (ref 4.8–5.6)

## 2022-05-18 ENCOUNTER — Telehealth: Payer: Self-pay

## 2022-05-18 NOTE — Chronic Care Management (AMB) (Signed)
Faxed 2024 re-enrollment application to [employer] patient assistance for [employer] and Novofine needles.    [name], [place] Pharmacist Assistant [phone]

## 2022-05-18 NOTE — Chronic Care Management (AMB) (Signed)
05-18-2022: Rescheduled patient's appointment on 05-24-2022 with [name] to January.  [place] Pharmacist Assistant [phone]

## 2022-05-24 ENCOUNTER — Telehealth: Payer: Medicare Other

## 2022-05-31 ENCOUNTER — Telehealth: Payer: Self-pay

## 2022-05-31 NOTE — Progress Notes (Cosign Needed)
    Called Sylvia Kelly, No answer, left message of appointment on 06-02-2022 at 10:00 via telephone visit with [name], Pharm D. Notified to have all medications, supplements, blood pressure and/or blood sugar logs available during appointment and to return call if need to reschedule.   Sylvia Kelly

## 2022-06-02 ENCOUNTER — Ambulatory Visit: Payer: Medicare Other

## 2022-06-02 ENCOUNTER — Telehealth: Payer: Medicare Other

## 2022-06-02 NOTE — Progress Notes (Signed)
Care Management & Coordination Services Pharmacy Note  06/02/2022 Name:  Sylvia Kelly MRN:  [id number] DOB:  1956-01-07  Summary: Patient reports receiving a denial letter from [employer] patient assistance.   Recommendations/Changes made from today's visit: Recommend patient bring in letter from [employer] to review.  Recommend patient enroll in PREP program.   Follow up plan: [name] to bring in [place] letter for review  [facility] to review AZ&Me status for [name] and communicate update to [name] Reach out to PCP team about PREP program follow up    Subjective: Sylvia Kelly is an 67 y.o. year old female who is a primary patient of Glendale Chard, MD.  The care coordination team was consulted for assistance with disease management and care coordination needs. She is maintaining her normal routine.     Engaged with patient by telephone for follow up visit.  Patient Care Team: Glendale Chard, MD as PCP - General (Internal Medicine) Mayford Knife, [facility] (Pharmacist)  Recent office visits: 02/15/2022 PCP OV 02/01/2022 PCP [facility] visits: None in previous 6 months   Objective:  Lab Results  Component Value Date   CREATININE 0.95 05/10/2022   BUN 15 05/10/2022   EGFR 66 05/10/2022   GFRNONAA 75 06/09/2020   GFRAA 86 06/09/2020   NA 140 05/10/2022   K 4.4 05/10/2022   CALCIUM 9.4 05/10/2022   CO2 26 05/10/2022   GLUCOSE 67 (L) 05/10/2022    Lab Results  Component Value Date/Time   HGBA1C 6.9 (H) 05/10/2022 04:44 PM   HGBA1C 7.4 (H) 02/01/2022 05:00 PM   HGBA1C 7.7 01/16/2018 12:00 AM   MICROALBUR 30 11/10/2020 12:48 PM   MICROALBUR 30 11/04/2019 11:00 AM    Last diabetic Eye exam:  Lab Results  Component Value Date/Time   HMDIABEYEEXA No Retinopathy 02/07/2022 12:00 AM    Last diabetic Foot exam: No results found for: "HMDIABFOOTEX"   Lab Results  Component Value Date   CHOL 168 02/01/2022   HDL 63 02/01/2022   LDLCALC 84 02/01/2022    TRIG 121 02/01/2022   CHOLHDL 2.7 02/01/2022       Latest Ref Rng & Units 02/01/2022    5:00 PM 06/17/2021    4:24 PM 04/14/2021    3:55 PM  Hepatic Function  Total Protein 6.0 - 8.5 g/dL 7.4  7.0  6.5   Albumin 3.9 - 4.9 g/dL 4.6  4.7  4.1   AST 0 - 40 IU/L '24  21  19   '$ ALT 0 - 32 IU/L '23  19  20   '$ Alk Phosphatase 44 - 121 IU/L 117  125  99   Total Bilirubin 0.0 - 1.2 mg/dL 0.3  0.2  0.3     Lab Results  Component Value Date/Time   TSH 0.60 10/10/2017 12:00 AM       Latest Ref Rng & Units 02/01/2022    5:00 PM 06/17/2021    4:24 PM 11/04/2019   10:08 AM  CBC  WBC 3.4 - 10.8 x10E3/uL 4.9  4.6  3.6   Hemoglobin 11.1 - 15.9 g/dL 13.3  12.8  12.1   Hematocrit 34.0 - 46.6 % 39.9  38.1  36.8   Platelets 150 - 450 x10E3/uL 312  305  285     Lab Results  Component Value Date/Time   VD25OH 20.6 (L) 06/17/2021 04:24 PM   VITAMINB12 509 06/17/2021 04:24 PM    Clinical ASCVD: No  The 10-year ASCVD  risk score (Arnett DK, et al., 2019) is: 18.3%   Values used to calculate the score:     Age: 27 years     Sex: Female     Is Non-Hispanic African American: Yes     Diabetic: Yes     Tobacco smoker: No     Systolic Blood Pressure: 160 mmHg     Is BP treated: Yes     HDL Cholesterol: 63 mg/dL     Total Cholesterol: 168 mg/dL       05/10/2022    4:07 PM 06/17/2021    3:21 PM 11/10/2020    8:59 AM  Depression screen PHQ 2/9  Decreased Interest 0 0 0  Down, Depressed, Hopeless 0 0 0  PHQ - 2 Score 0 0 0     Social History   Tobacco Use  Smoking Status Never  Smokeless Tobacco Never   BP Readings from Last 3 Encounters:  05/10/22 128/74  02/15/22 120/70  02/01/22 102/68   Pulse Readings from Last 3 Encounters:  05/10/22 83  02/15/22 83  02/01/22 87   Wt Readings from Last 3 Encounters:  05/10/22 154 lb (69.9 kg)  02/15/22 151 lb 12.8 oz (68.9 kg)  02/01/22 147 lb 9.6 oz (67 kg)   BMI Readings from Last 3 Encounters:  05/10/22 26.11 kg/m  02/15/22 25.73 kg/m   02/01/22 25.02 kg/m    No Known Allergies  Medications Reviewed Today     Reviewed by Glendale Chard, MD (Physician) on 05/10/22 at 1631  Med List Status: <None>   Medication Order Taking? Sig Documenting Provider Last Dose Status Informant  aspirin 81 MG tablet [id number] Yes Take 81 mg by mouth daily. [provider] Taking Active   atorvastatin (LIPITOR) 80 MG tablet [id number] Yes TAKE 1 TABLET BY MOUTH EVERY DAY Glendale Chard, MD Taking Active   Blood Glucose Monitoring Suppl [facility] VERIO REFLECT) w/Device KIT [id number] Yes Use as directed to check blood sugars 3 times per day dx: e11.65 Glendale Chard, MD Taking Active   dapagliflozin propanediol (FARXIGA) 10 MG TABS tablet [id number] Yes Take by mouth daily. [provider] Taking Active   dextrose 5 % solution [id number]   Mauri Pole, MD  Active   fluticasone (FLONASE) 50 MCG/ACT nasal spray [id number] Yes SPRAY 1 SPRAY INTO EACH NOSTRIL EVERY DAY Raspet, Erin K, PA-C Taking Active   hydrOXYzine (ATARAX) 10 MG tablet [id number] Yes 1-2 TABS AT BEDTIME AS NEEDED FOR Caryn Bee, MD Taking Active   insulin degludec (TRESIBA FLEXTOUCH) 200 UNIT/ML FlexTouch Pen [id number] Yes Inject 56 Units into the skin at bedtime.  Patient taking differently: Inject 38 Units into the skin at bedtime.   Bary Castilla, NP Taking Active   Insulin Pen Needle (B-D UF III MINI PEN NEEDLES) 31G X 5 MM MISC [id number] Yes USE AS DIRECTED WITH Leodis Liverpool, MD Taking Active   lisinopril-hydrochlorothiazide (ZESTORETIC) 20-25 MG tablet [id number] Yes TAKE 1/2 TABLET BY MOUTH EVERY DAY Glendale Chard, MD Taking Active   Multiple Vitamin (MULTIVITAMIN) tablet [id number] Yes Take 1 tablet by mouth daily. [provider] Taking Active   OneTouch Delica Lancets 82N MISC [id number] Yes Use as directed to check blood sugars 3 times per day dx: e11.65 Glendale Chard, MD Taking Active   [facility] VERIO test strip  [id number] Yes Use as instructed Glendale Chard, MD Taking Active   OZEMPIC, 1 MG/DOSE, 4 MG/3ML SOPN [id number] Yes INJECT 1 MG INTO THE SKIN ONCE A WEEK.  Bary Castilla, NP Taking Active   pantoprazole (PROTONIX) 20 MG tablet [id number] Yes TAKE 1 TABLET BY MOUTH EVERY DAY Glendale Chard, MD Taking Active   triamcinolone cream (KENALOG) 0.1 % [id number] Yes APPLY TO AFFECTED AREA TWICE DAILY AS NEEDED Glendale Chard, MD Taking Active             Patient Active Problem List   Diagnosis Date Noted   Vitamin D insufficiency 06/24/2021   Body mass index (BMI) of 25.0 to 25.9 in adult 06/24/2021   Pseudopolyposis of colon without complication, unspecified part of colon ([place]) 06/17/2021   Multinodular goiter 11/12/2020   Paresthesia and pain of extremity 11/12/2020   Female climacteric state 01/04/2017   Other long term (current) drug therapy 01/04/2017   Essential (primary) hypertension    Type 2 diabetes mellitus with diabetic neuropathy, unspecified (HCC)    Elevated cholesterol     Immunization History  Administered Date(s) Administered   DTaP 09/09/2013   Fluad Quad(high Dose 65+) 04/14/2021   Influenza Inj Mdck Quad Pf 03/20/2019   Influenza,inj,Quad PF,6+ Mos 04/04/2012, 02/26/2017, 03/21/2018, 03/20/2019, 04/09/2020   Influenza-Unspecified 03/21/2018, 01/28/2022   Moderna Covid-19 Vaccine Bivalent Booster 3yr & up 07/26/2021   Moderna Sars-Covid-2 Vaccination 07/08/2019, 08/05/2019, 03/26/2020, 01/10/2021   Pneumococcal Conjugate-13 05/21/2018   Pneumococcal Polysaccharide-23 05/08/2019   Tdap 05/29/2012   Zoster Recombinat (Shingrix) 11/12/2020, 06/07/2021     Compliance/Adherence/Medication fill history: Care Gaps: COVID-19 Vaccine   Star-Rating Drugs: Atorvastatin 80 mg tablet  Lisinopril-Hydrochlorothiazide 20-25 mg tablet   SDOH:  (Social Determinants of Health) assessments and interventions performed: No SDOH Interventions    Flowsheet Row Office Visit  from 06/17/2021 in [facility]  SDOH Interventions   Food Insecurity Interventions Intervention Not Indicated  Housing Interventions Intervention Not Indicated  Financial Strain Interventions Intervention Not Indicated  Physical Activity Interventions --  [Plans to increase time spent exercising]      SDOH Screenings   Food Insecurity: No Food Insecurity (06/17/2021)  Housing: Low Risk  (06/17/2021)  Alcohol Screen: Low Risk  (06/17/2021)  Depression (PHQ2-9): Low Risk  (05/10/2022)  Financial Resource Strain: Low Risk  (06/17/2021)  Physical Activity: Insufficiently Active (06/17/2021)  Tobacco Use: Low Risk  (05/10/2022)    Medication Assistance: Application for Ozempic, Tresiba  medication assistance program. in process.  Anticipated assistance start date 06/07/2022.  See plan of care for additional detail.  Medication Access: Within the past 30 days, how often has patient missed a dose of medication? Patient reports not missing a dose Is a pillbox or other method used to improve adherence? Yes  Factors that may affect medication adherence? financial need Are meds synced by current pharmacy? No  Are meds delivered by current pharmacy? No  Does patient experience delays in picking up medications due to transportation concerns? No   [facility] Reviewed: Is patient disadvantaged to use [facility]?: Yes  Current Rx insurance plan: Medicare   Name and location of Current pharmacy:  [id number] [place] [address] [place]one: [phone]: [phone] [address]one: [phone]: [phone] services reviewed with patient today?: No  Patient requests to transfer care to [facility]?: No   Reason patient declined to change pharmacies: Disadvantaged due to insurance/mail order  Assessment/Plan   Diabetes (A1c goal <7%) -Controlled -Current medications: Farxiga 10 mg tablet once per day Appropriate, Effective, Safe, Accessible Tresiba 200 unit/ml - Inject 56 units at the bedtime  Appropriate, Effective, Safe, Accessible Ozempic 1 mg once per week  Appropriate, Effective, Safe, Accessible -Current home glucose readings - she is checking her BS in the morning  fasting glucose: 136, 120, 94, 91 -Denies hypoglycemic/hyperglycemic symptoms -Current meal patterns: for the last three days she has had salmon, and she is changing the way she cooks food. She is learning how to prepare and make different healthy things.  breakfast:  oatmeal lunch: she has increased the amount of avocados she is eating   dinner: salmon snacks: fried chicken gizzards, a couple drinks: plenty of water -Current exercise: she is walking three days per week, and low impact aerobics with [employer] she is working out for at least 120 minutes per week.  -Educated on  continuing her current nutrition regimen and  -Counseled to check feet daily and get yearly eye exams -Recommended to continue current medication  [name], CPP, PharmD Clinical Pharmacist Practitioner [facility] [phone]

## 2022-06-07 ENCOUNTER — Encounter: Payer: Self-pay | Admitting: Internal Medicine

## 2022-06-09 ENCOUNTER — Other Ambulatory Visit: Payer: Self-pay

## 2022-06-09 MED ORDER — OZEMPIC (1 MG/DOSE) 4 MG/3ML ~~LOC~~ SOPN: 1.0000 mg | PEN_INJECTOR | SUBCUTANEOUS | 2 refills | Status: DC

## 2022-06-09 MED ORDER — TRESIBA FLEXTOUCH 200 UNIT/ML ~~LOC~~ SOPN: 38.0000 [IU] | PEN_INJECTOR | Freq: Every day | SUBCUTANEOUS | 2 refills | Status: DC

## 2022-06-11 ENCOUNTER — Encounter: Payer: Self-pay | Admitting: Internal Medicine

## 2022-06-16 IMAGING — US US THYROID
1 series · 12 of 25 positions shown · non-contrast
Comparison: 11/18/2019

CLINICAL DATA: Multinodular thyroid

EXAM:
THYROID ULTRASOUND
TECHNIQUE: Ultrasound examination of the thyroid gland and adjacent soft
tissues was performed.

[Series 1: us thyroid · 0.05mm/px · 12 of 53 slices shown]
[im 3/53]
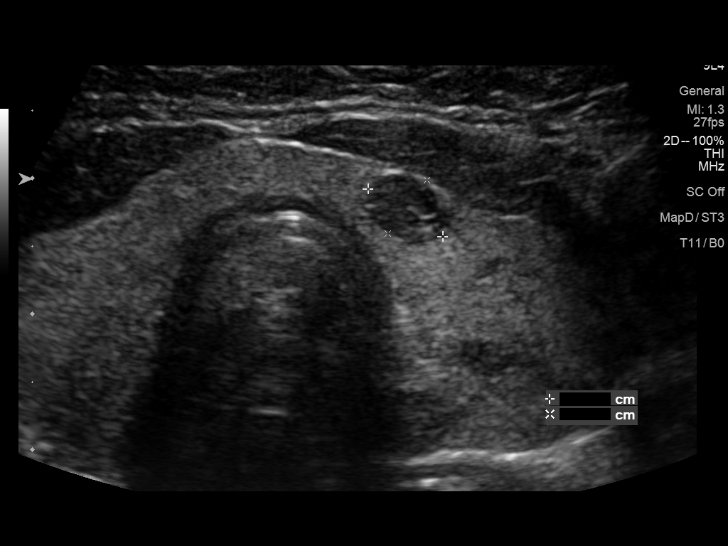
[im 7/53]
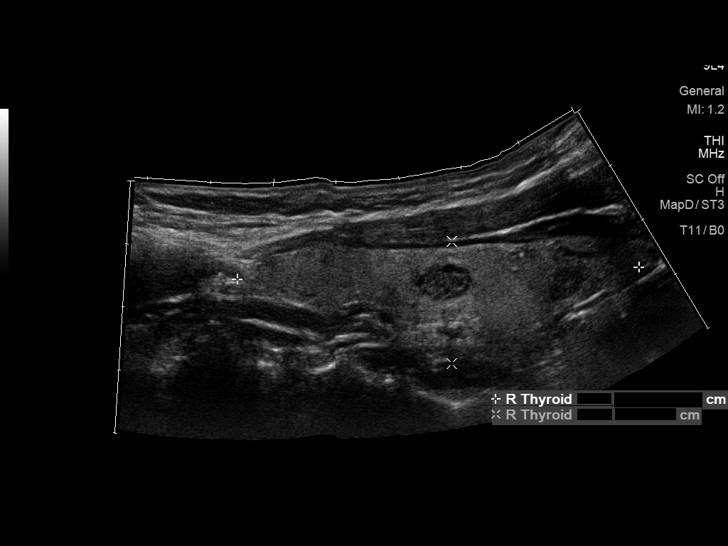
[im 11/53]
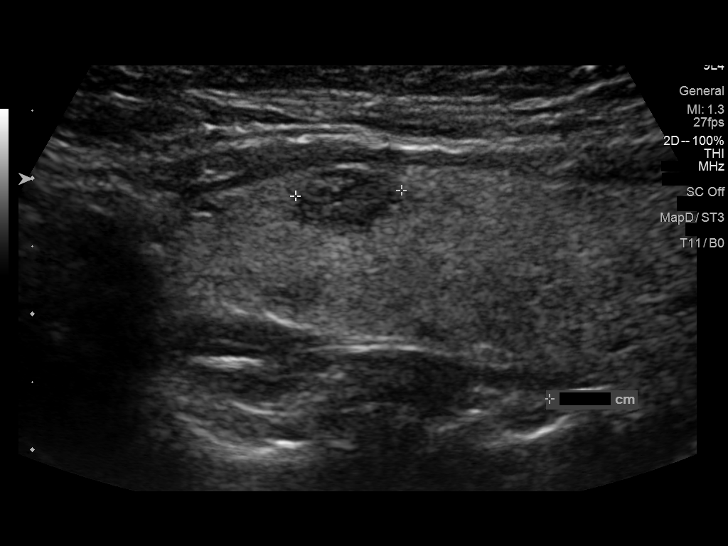
[im 16/53]
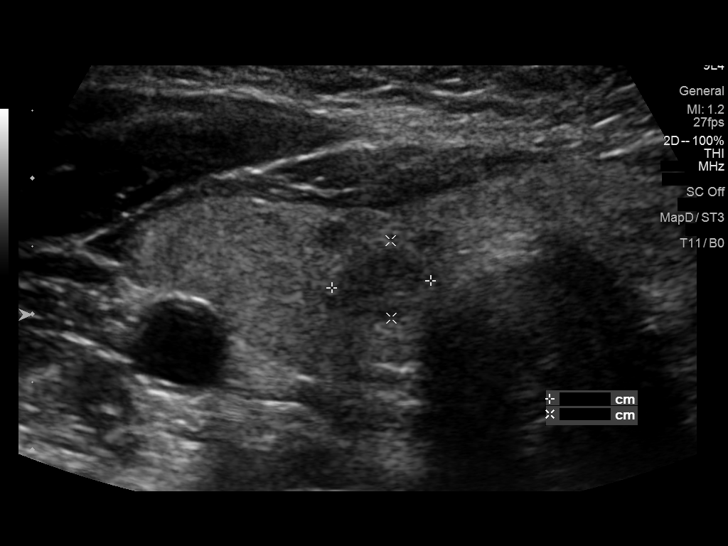
[im 20/53]
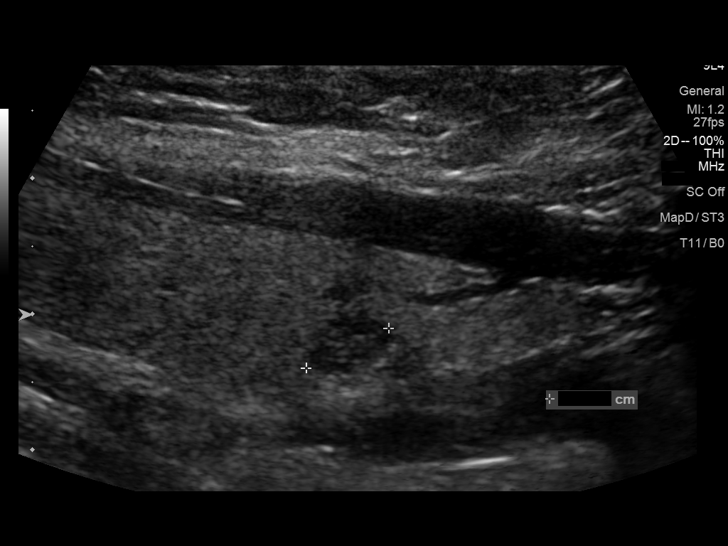
[im 24/53]
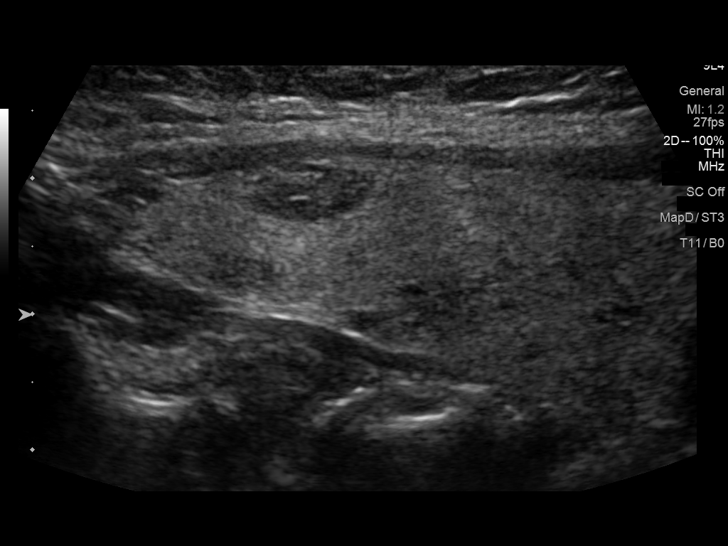
[im 29/53]
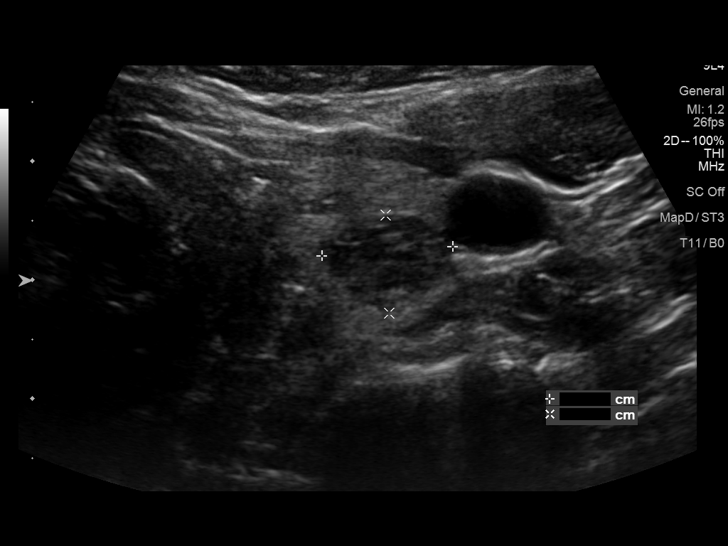
[im 33/53]
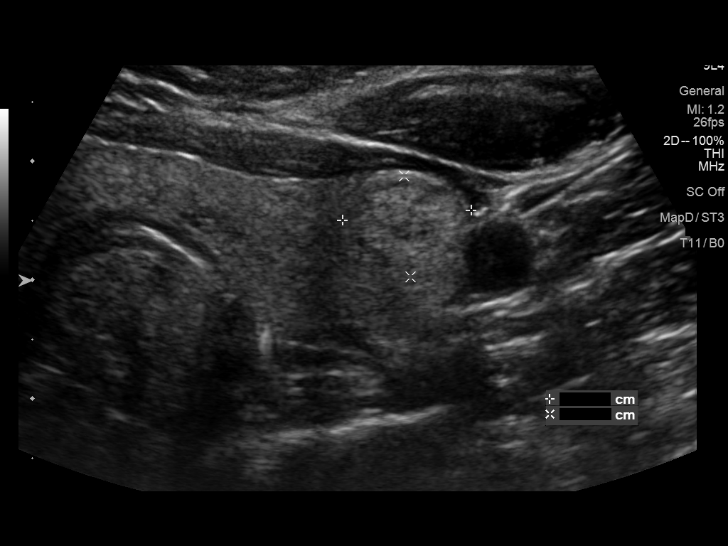
[im 37/53]
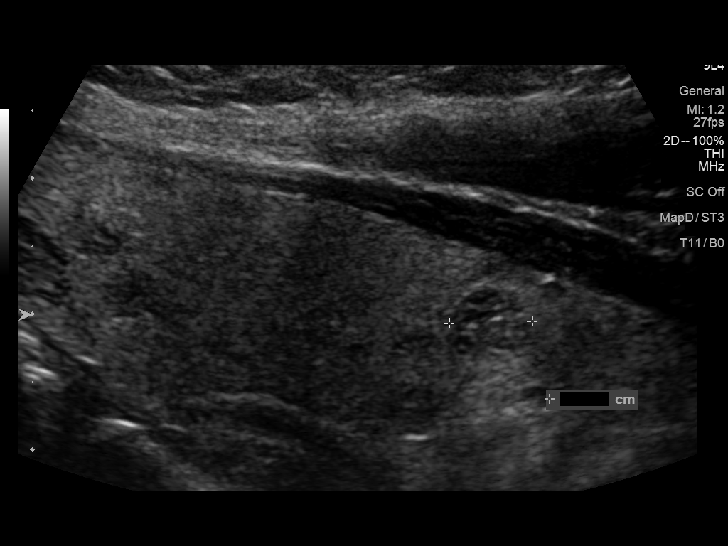
[im 42/53]
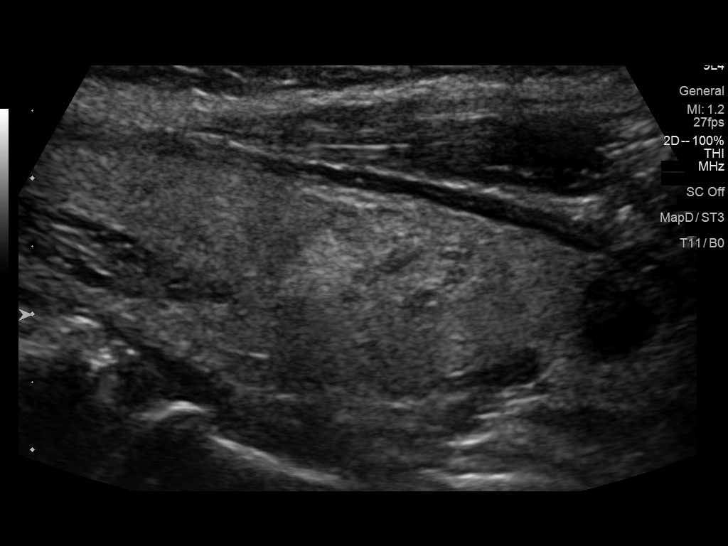
[im 46/53]
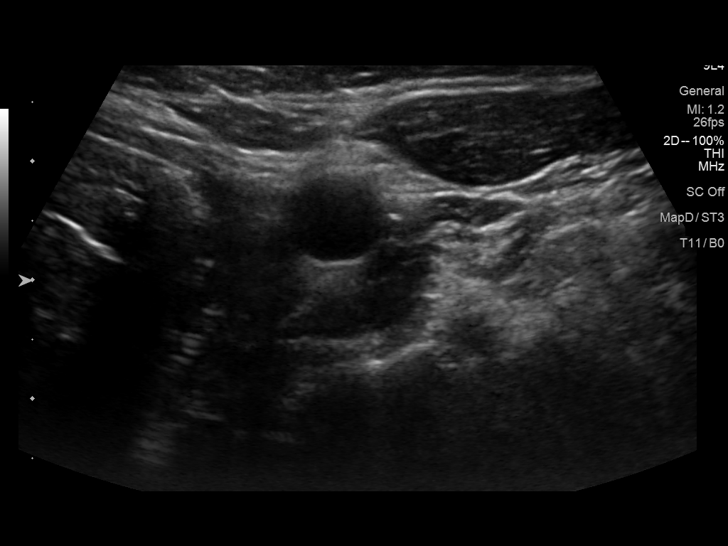
[im 50/53]
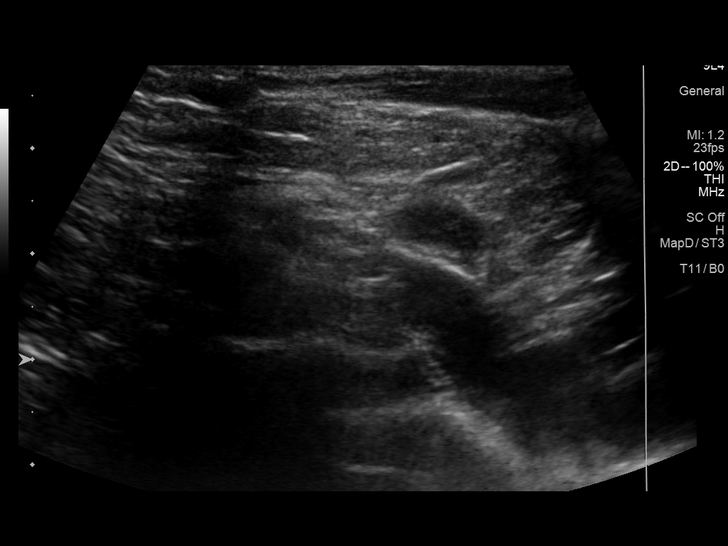

[12 of 25 positions shown; findings below may reference images not displayed]

FINDINGS: Parenchymal Echotexture: Mildly heterogenous

Isthmus: 5 mm

Right lobe: 6.4 x 2.0 x 2.5 cm

Left lobe: 6.8 x 2.0 x 2.4 cm

_________________________________________________________

Estimated total number of nodules >/= 1 cm: 5

Number of spongiform nodules >/=  2 cm not described below (TR1): 0

Number of mixed cystic and solid nodules >/= 1.5 cm not described
below (TR2): 0

_________________________________________________________

Nodule # 1:

Location: Isthmus; left

Maximum size: 1.1, previously 0.9 cm; Other 2 dimensions: 0.7 x
cm

Composition: mixed cystic and solid (1)

Echogenicity: hypoechoic (2)

Shape: not taller-than-wide (0)

Margins: smooth (0)

Echogenic foci: none (0)

ACR TI-RADS total points: 3.

ACR TI-RADS risk category: TR3 (3 points).

ACR TI-RADS recommendations:

Given size (<1.4 cm) and appearance, this nodule does NOT meet
TI-RADS criteria for biopsy or dedicated follow-up.

_________________________________________________________

Nodule # 3:

Location: Right; Mid

Maximum size: 1.1, unchanged cm; Other 2 dimensions: 1.0 x 0.4 cm

Composition: mixed cystic and solid (1)

Echogenicity: hypoechoic (2)

Shape: not taller-than-wide (0)

Margins: smooth (0)

Echogenic foci: none (0)

ACR TI-RADS total points: 3.

ACR TI-RADS risk category: TR3 (3 points).

ACR TI-RADS recommendations:

Given size (<1.4 cm) and appearance, this nodule does NOT meet
TI-RADS criteria for biopsy or dedicated follow-up.

_________________________________________________________

Nodule # 6:

Location: Left; Superior

Maximum size: 1.7, previously 1.4 cm; Other 2 dimensions: 1.1 x
cm

Composition: mixed cystic and solid (1)

Echogenicity: hypoechoic (2)

Shape: not taller-than-wide (0)

Margins: smooth (0)

Echogenic foci: none (0)

ACR TI-RADS total points: 3.

ACR TI-RADS risk category: TR3 (3 points).

ACR TI-RADS recommendations:

*Given size (>/= 1.5 - 2.4 cm) and appearance, a follow-up
ultrasound in 1 year should be considered based on TI-RADS criteria.

_________________________________________________________

Nodule # 7:

Location: Left; Mid

Maximum size: 1.6, previously 2.0 cm; Other 2 dimensions: 1.1 x
cm

Composition: solid/almost completely solid (2)

Echogenicity: isoechoic (1)

Shape: not taller-than-wide (0)

Margins: ill-defined (0)

Echogenic foci: none (0)

ACR TI-RADS total points: 3.

ACR TI-RADS risk category: TR3 (3 points).

ACR TI-RADS recommendations:

*Given size (>/= 1.5 - 2.4 cm) and appearance, a follow-up
ultrasound in 1 year should be considered based on TI-RADS criteria.

_________________________________________________________

There are several additional bilateral mixed cystic/solid and
hypoechoic nodules noted, all measuring 1 cm less in size. These
would not meet criteria for any biopsy or follow-up and are not
fully described by TI rads criteria.

Stable mild thyroid heterogeneity without hypervascularity or
regional adenopathy.
IMPRESSION: Two left thyroid TR 3 nodules (nodules 6 and nodule 7 on today's
exam), both meet criteria for follow-up in 1 year.

Currently no nodule meets criteria for biopsy.

The above is in keeping with the ACR TI-RADS recommendations - [HOSPITAL] 5191;[DATE].

## 2022-06-21 ENCOUNTER — Other Ambulatory Visit: Payer: Self-pay

## 2022-06-21 MED ORDER — TRESIBA FLEXTOUCH 200 UNIT/ML ~~LOC~~ SOPN: 38.0000 [IU] | PEN_INJECTOR | Freq: Every day | SUBCUTANEOUS | 2 refills | Status: DC

## 2022-06-21 MED ORDER — OZEMPIC (1 MG/DOSE) 4 MG/3ML ~~LOC~~ SOPN: 1.0000 mg | PEN_INJECTOR | SUBCUTANEOUS | 2 refills | Status: DC

## 2022-07-06 ENCOUNTER — Ambulatory Visit (INDEPENDENT_AMBULATORY_CARE_PROVIDER_SITE_OTHER): Payer: Medicare Other | Admitting: Internal Medicine

## 2022-07-06 ENCOUNTER — Ambulatory Visit (INDEPENDENT_AMBULATORY_CARE_PROVIDER_SITE_OTHER): Payer: Medicare Other

## 2022-07-06 ENCOUNTER — Encounter: Payer: Self-pay | Admitting: Internal Medicine

## 2022-07-06 VITALS — BP 106/72 | HR 60 | Temp 97.9°F | Ht 64.0 in | Wt 147.2 lb

## 2022-07-06 VITALS — BP 106/72 | HR 60 | Temp 97.9°F | Ht 64.0 in | Wt 147.0 lb

## 2022-07-06 DIAGNOSIS — Z794 Long term (current) use of insulin: Secondary | ICD-10-CM

## 2022-07-06 DIAGNOSIS — Z0000 Encounter for general adult medical examination without abnormal findings: Secondary | ICD-10-CM

## 2022-07-06 DIAGNOSIS — R0982 Postnasal drip: Secondary | ICD-10-CM

## 2022-07-06 DIAGNOSIS — I10 Essential (primary) hypertension: Secondary | ICD-10-CM | POA: Diagnosis not present

## 2022-07-06 DIAGNOSIS — E7800 Pure hypercholesterolemia, unspecified: Secondary | ICD-10-CM

## 2022-07-06 DIAGNOSIS — E1140 Type 2 diabetes mellitus with diabetic neuropathy, unspecified: Secondary | ICD-10-CM | POA: Diagnosis not present

## 2022-07-06 DIAGNOSIS — Z6825 Body mass index (BMI) 25.0-25.9, adult: Secondary | ICD-10-CM

## 2022-07-06 LAB — POCT URINALYSIS DIPSTICK
Bilirubin, UA: NEGATIVE
Glucose, UA: POSITIVE — AB
Ketones, UA: NEGATIVE
Leukocytes, UA: NEGATIVE
Nitrite, UA: NEGATIVE
Protein, UA: NEGATIVE
Spec Grav, UA: 1.02 (ref 1.010–1.025)
Urobilinogen, UA: 0.2 E.U./dL
pH, UA: 7 (ref 5.0–8.0)

## 2022-07-06 NOTE — Progress Notes (Signed)
I,[name],acting as a scribe for [name], MD.,have documented all relevant documentation on the behalf of [name], MD,as directed by  [name], MD while in the presence of [name], MD.    Subjective:     Patient ID: Sylvia Kelly , female    DOB: 1956/01/22 , 67 y.o.   MRN: [id number]   Chief Complaint  Patient presents with   Diabetes   Hypertension   Hyperlipidemia    HPI  She presents today for DM/HTN follow-up.  She reports compliance with meds.  She denies headaches, chest pain and shortness of breath.  She wants to get PCN shot today.  She states she is having a lot of sinus drainage & congestion. She denies having fever/chills/bodyaches. She has tried Zyrtec which didn't work for her.    Patient will complete AWV today.   Diabetes She presents for her follow-up diabetic visit. She has type 2 diabetes mellitus. Her disease course has been improving. There are no hypoglycemic associated symptoms. Pertinent negatives for diabetes include no blurred vision, no chest pain, no fatigue, no polydipsia, no polyphagia and no polyuria. There are no hypoglycemic complications. Diabetic complications include peripheral neuropathy. Risk factors for coronary artery disease include diabetes mellitus, dyslipidemia, sedentary lifestyle, hypertension and post-menopausal.  Hypertension This is a chronic problem. The current episode started more than 1 year ago. The problem has been gradually improving since onset. Pertinent negatives include no blurred vision, chest pain, palpitations or shortness of breath.  Hyperlipidemia Pertinent negatives include no chest pain or shortness of breath.     Past Medical History:  Diagnosis Date   Allergy    Arthritis    hip   Atrophic vaginitis    Diabetes mellitus    Elevated cholesterol    Hypertension    Neuromuscular disorder (HCC)    neuropathy hands and feet     Family History  Problem Relation Age  of Onset   Heart disease Mother    Diabetes Mother    Cancer Father        Lung cancer   Colon cancer Neg Hx    Colon polyps Neg Hx    Esophageal cancer Neg Hx    Rectal cancer Neg Hx    Stomach cancer Neg Hx      Current Outpatient Medications:    aspirin 81 MG tablet, Take 81 mg by mouth daily., Disp: , Rfl:    atorvastatin (LIPITOR) 80 MG tablet, TAKE 1 TABLET BY MOUTH EVERY DAY, Disp: 90 tablet, Rfl: 1   dapagliflozin propanediol (FARXIGA) 10 MG TABS tablet, Take by mouth daily., Disp: , Rfl:    fluticasone (FLONASE) 50 MCG/ACT nasal spray, SPRAY 1 SPRAY INTO EACH NOSTRIL EVERY DAY, Disp: 16 g, Rfl: 0   hydrOXYzine (ATARAX) 10 MG tablet, 1-2 TABS AT BEDTIME AS NEEDED FOR ITCHING, Disp: 180 tablet, Rfl: 1   insulin degludec (TRESIBA FLEXTOUCH) 200 UNIT/ML FlexTouch Pen, Inject 38 Units into the skin at bedtime., Disp: 27 mL, Rfl: 2   Insulin Pen Needle (B-D UF III MINI PEN NEEDLES) 31G X 5 MM MISC, USE AS DIRECTED WITH LEVEMIR, Disp: 150 each, Rfl: 3   lisinopril-hydrochlorothiazide (ZESTORETIC) 20-25 MG tablet, TAKE 1/2 TABLET BY MOUTH EVERY DAY, Disp: 45 tablet, Rfl: 2   Multiple Vitamin (MULTIVITAMIN) tablet, Take 1 tablet by mouth daily., Disp: , Rfl:    OneTouch Delica Lancets [id number] MISC, Use as directed to check blood sugars 3 times per  day dx: e11.65, Disp: 150 each, Rfl: 3   ONETOUCH VERIO test strip, Use as instructed, Disp: 200 strip, Rfl: 3   pantoprazole (PROTONIX) 20 MG tablet, TAKE 1 TABLET BY MOUTH EVERY DAY, Disp: 90 tablet, Rfl: 1   Semaglutide, 1 MG/DOSE, (OZEMPIC, 1 MG/DOSE,) 4 MG/3ML SOPN, Inject 1 mg into the skin once a week., Disp: 3 mL, Rfl: 2   triamcinolone cream (KENALOG) 0.1 %, APPLY TO AFFECTED AREA TWICE DAILY AS NEEDED, Disp: 30 g, Rfl: 1  Current Facility-Administered Medications:    dextrose 5 % solution, , Intravenous, Continuous, Nandigam, Kavitha V, MD   No Known Allergies   Review of Systems  Constitutional: Negative.  Negative for fatigue.   Eyes:  Negative for blurred vision.  Respiratory: Negative.  Negative for shortness of breath.   Cardiovascular: Negative.  Negative for chest pain and palpitations.  Endocrine: Negative for polydipsia, polyphagia and polyuria.  Neurological: Negative.   Psychiatric/Behavioral: Negative.       Today's Vitals   07/06/22 1504  BP: 106/72  Pulse: 60  Temp: 97.9 F (36.6 C)  SpO2: 98%  Weight: 147 lb 3.2 oz (66.8 kg)  Height: 5' 4"$  (1.626 m)   Body mass index is 25.27 kg/m.  Wt Readings from Last 3 Encounters:  07/06/22 147 lb (66.7 kg)  07/06/22 147 lb 3.2 oz (66.8 kg)  05/10/22 154 lb (69.9 kg)    Objective:  Physical Exam Vitals and nursing note reviewed.  Constitutional:      Appearance: Normal appearance.  HENT:     Head: Normocephalic and atraumatic.     Nose:     Comments: Masked     Mouth/Throat:     Comments: masked Eyes:     Extraocular Movements: Extraocular movements intact.  Cardiovascular:     Rate and Rhythm: Normal rate and regular rhythm.     Heart sounds: Normal heart sounds.  Pulmonary:     Effort: Pulmonary effort is normal.     Breath sounds: Normal breath sounds.  Musculoskeletal:     Cervical back: Normal range of motion.  Skin:    General: Skin is warm.  Neurological:     General: No focal deficit present.     Mental Status: She is alert.  Psychiatric:        Mood and Affect: Mood normal.        Behavior: Behavior normal.         Assessment And Plan:     1. Type 2 diabetes mellitus with diabetic neuropathy, with long-term current use of insulin (HCC) Comments: Chronic, she will c/w Tresiba 38 units nightly. Ozempic weekly and Farxiga 87m daily. Encouraged to follow dietary recommendations. - POCT Urinalysis Dipstick (81002) - Microalbumin / creatinine urine ratio  2. Essential (primary) hypertension Comments: Chronic, well controlled. She will c/w lisinopril/hctz 20/12.573mdaily. Encouraged to follow low sodium diet. - POCT  Urinalysis Dipstick (81002) - Microalbumin / creatinine urine ratio  3. Pure hypercholesterolemia Comments: chronic, LDL goal < 70.  She will c/w atorvastatin 8086maily. She is encouraged to follow a heart healthy lifestyle.  4. Postnasal drip Comments: Advised to c/w Zyrtec. Advised to decrease dairy intake as well. If persistent, will consider adding Astelin to her current regimen.  5. BMI 25.0-25.9,adult Comments: She is encouraged to aim for at least 150 minutes of exercise per week.   Patient was given opportunity to ask questions. Patient verbalized understanding of the plan and was able to repeat key elements of  the plan. All questions were answered to their satisfaction.   I, [name], MD, have reviewed all documentation for this visit. The documentation on 07/11/22 for the exam, diagnosis, procedures, and orders are all accurate and complete.   IF YOU HAVE BEEN REFERRED TO A SPECIALIST, IT MAY TAKE 1-2 WEEKS TO SCHEDULE/PROCESS THE REFERRAL. IF YOU HAVE NOT HEARD FROM US/SPECIALIST IN TWO WEEKS, PLEASE GIVE [place] A CALL AT [phone] X [phone].   THE PATIENT IS ENCOURAGED TO PRACTICE SOCIAL DISTANCING DUE TO THE COVID-19 PANDEMIC.

## 2022-07-06 NOTE — Progress Notes (Signed)
Subjective:   Sylvia Kelly is a 67 y.o. female who presents for an Initial Medicare Annual Wellness Visit.  Review of Systems     Cardiac Risk Factors include: advanced age (>63mn, >>61women);diabetes mellitus;hypertension     Objective:    Today's Vitals   07/06/22 1523  BP: 106/72  Pulse: 60  Temp: 97.9 F (36.6 C)  TempSrc: Oral  SpO2: 98%  Weight: 147 lb (66.7 kg)  Height: '5\' 4"'$  (1.626 m)   Body mass index is 25.23 kg/m.     07/06/2022    3:38 PM 06/17/2021    3:19 PM 09/28/2015    4:25 PM 04/10/2015   10:03 PM  Advanced Directives  Does Patient Have a Medical Advance Directive? No No No No  Would patient like information on creating a medical advance directive? Yes (MAU/Ambulatory/Procedural Areas - Information given) Yes (ED - Information included in AVS) No - patient declined information No - patient declined information    Current Medications (verified) Outpatient Encounter Medications as of 07/06/2022  Medication Sig   aspirin 81 MG tablet Take 81 mg by mouth daily.   atorvastatin (LIPITOR) 80 MG tablet TAKE 1 TABLET BY MOUTH EVERY DAY   dapagliflozin propanediol (FARXIGA) 10 MG TABS tablet Take by mouth daily.   fluticasone (FLONASE) 50 MCG/ACT nasal spray SPRAY 1 SPRAY INTO EACH NOSTRIL EVERY DAY   hydrOXYzine (ATARAX) 10 MG tablet 1-2 TABS AT BEDTIME AS NEEDED FOR ITCHING   insulin degludec (TRESIBA FLEXTOUCH) 200 UNIT/ML FlexTouch Pen Inject 38 Units into the skin at bedtime.   Insulin Pen Needle (B-D UF III MINI PEN NEEDLES) 31G X 5 MM MISC USE AS DIRECTED WITH LEVEMIR   lisinopril-hydrochlorothiazide (ZESTORETIC) 20-25 MG tablet TAKE 1/2 TABLET BY MOUTH EVERY DAY   Multiple Vitamin (MULTIVITAMIN) tablet Take 1 tablet by mouth daily.   OneTouch Delica Lancets 386VMISC Use as directed to check blood sugars 3 times per day dx: e11.65   ONETOUCH VERIO test strip Use as instructed   pantoprazole (PROTONIX) 20 MG tablet TAKE 1 TABLET BY MOUTH EVERY DAY    Semaglutide, 1 MG/DOSE, (OZEMPIC, 1 MG/DOSE,) 4 MG/3ML SOPN Inject 1 mg into the skin once a week.   triamcinolone cream (KENALOG) 0.1 % APPLY TO AFFECTED AREA TWICE DAILY AS NEEDED   Facility-Administered Encounter Medications as of 07/06/2022  Medication   dextrose 5 % solution    Allergies (verified) Patient has no known allergies.   History: Past Medical History:  Diagnosis Date   Allergy    Arthritis    hip   Atrophic vaginitis    Diabetes mellitus    Elevated cholesterol    Hypertension    Neuromuscular disorder (HCC)    neuropathy hands and feet   Past Surgical History:  Procedure Laterality Date   COLONOSCOPY     10 yrs ago[facility]- normal per pt   OVARIAN CYST SURGERY  05/29/1990   REFRACTIVE SURGERY     TUBAL LIGATION     Family History  Problem Relation Age of Onset   Heart disease Mother    Diabetes Mother    Cancer Father        Lung cancer   Colon cancer Neg Hx    Colon polyps Neg Hx    Esophageal cancer Neg Hx    Rectal cancer Neg Hx    Stomach cancer Neg Hx    Social History   Socioeconomic History   Marital status: Married  Spouse name: Not on file   Number of children: Not on file   Years of education: Not on file   Highest education level: Not on file  Occupational History   Not on file  Tobacco Use   Smoking status: Never   Smokeless tobacco: Never  Vaping Use   Vaping Use: Never used  Substance and Sexual Activity   Alcohol use: No   Drug use: No   Sexual activity: Yes    Birth control/protection: Surgical  Other Topics Concern   Not on file  Social History Narrative   Not on file   Social Determinants of Health   Financial Resource Strain: Low Risk  (07/06/2022)   Overall Financial Resource Strain (CARDIA)    Difficulty of Paying Living Expenses: Not hard at all  Food Insecurity: No Food Insecurity (07/06/2022)   Hunger Vital Sign    Worried About Running Out of Food in the Last Year: Never true    [place] in  the Last Year: Never true  Transportation Needs: No Transportation Needs (07/06/2022)   PRAPARE - Hydrologist (Medical): No    Lack of Transportation (Non-Medical): No  Physical Activity: Insufficiently Active (07/06/2022)   Exercise Vital Sign    Days of Exercise per Week: 4 days    Minutes of Exercise per Session: 30 min  Stress: No Stress Concern Present (07/06/2022)   [place]    Feeling of Stress : Not at all  Social Connections: Not on file    Tobacco Counseling Counseling given: Not Answered   Clinical Intake:  Pre-visit preparation completed: Yes  Pain : No/denies pain     Nutritional Status: BMI 25 -29 Overweight Nutritional Risks: None Diabetes: No  How often do you need to have someone help you when you read instructions, pamphlets, or other written materials from your doctor or pharmacy?: 1 - Never  Diabetic? Yes Nutrition Risk Assessment:  Has the patient had any N/V/D within the last 2 months?  No  Does the patient have any non-healing wounds?  No  Has the patient had any unintentional weight loss or weight gain?  No   Diabetes:  Is the patient diabetic?  Yes  If diabetic, was a CBG obtained today?  No  Did the patient bring in their glucometer from home?  No  How often do you monitor your CBG's? daily.   Financial Strains and Diabetes Management:  Are you having any financial strains with the device, your supplies or your medication? No .  Does the patient want to be seen by Chronic Care Management for management of their diabetes?  No  Would the patient like to be referred to a Nutritionist or for Diabetic Management?  No   Diabetic Exams:  Diabetic Eye Exam: Completed 02/07/2022 Diabetic Foot Exam: Overdue, Pt has been advised about the importance in completing this exam. Pt is scheduled for diabetic foot exam on next appointment.   Interpreter Needed?:  No  Information entered by :: NAllen LPN   Activities of Daily Living    07/06/2022    3:39 PM  In your present state of health, do you have any difficulty performing the following activities:  Hearing? 0  Vision? 0  Difficulty concentrating or making decisions? 0  Walking or climbing stairs? 0  Dressing or bathing? 0  Doing errands, shopping? 0  Preparing Food and eating ? N  Using the  Toilet? N  In the past six months, have you accidently leaked urine? N  Do you have problems with loss of bowel control? N  Managing your Medications? N  Managing your Finances? N  Housekeeping or managing your Housekeeping? N    Patient Care Team: Glendale Chard, MD as PCP - General (Internal Medicine) Mayford Knife, [facility] (Pharmacist)  Indicate any recent Medical Services you may have received from other than [facility] providers in the past year (date may be approximate).     Assessment:   This is a routine wellness examination for [place].  Hearing/Vision screen Vision Screening - Comments:: Regular eye exams, [facility]  Dietary issues and exercise activities discussed: Current Exercise Habits: Home exercise routine, Type of exercise: walking, Time (Minutes): 30, Frequency (Times/Week): 4, Weekly Exercise (Minutes/Week): 120   Goals Addressed             This Visit's Progress    Patient Stated       07/06/2022, increase muscle mass       Depression Screen    07/06/2022    3:39 PM 07/06/2022    3:04 PM 05/10/2022    4:07 PM 06/17/2021    3:21 PM 11/10/2020    8:59 AM 11/04/2019    9:22 AM 02/06/2019   11:02 AM  PHQ 2/9 Scores  PHQ - 2 Score 0 0 0 0 0 0 0    Fall Risk    07/06/2022    3:39 PM 07/06/2022    3:04 PM 05/10/2022    4:07 PM 06/17/2021    3:20 PM 11/10/2020    8:58 AM  [place] in the past year? 0 0 0 0 1  Number falls in past yr: 0 0 0 0 0  Comment     patient [place]  Injury with Fall? 0 0 0 0 1  Risk for fall due to :  Medication side effect No Fall Risks No Fall Risks    Follow up Falls prevention discussed;Education provided;Falls evaluation completed Falls evaluation completed Falls evaluation completed      FALL RISK PREVENTION PERTAINING TO THE HOME:  Any stairs in or around the home? Yes  If so, are there any without handrails? No  Home free of loose throw rugs in walkways, pet beds, electrical cords, etc? Yes  Adequate lighting in your home to reduce risk of falls? Yes   ASSISTIVE DEVICES UTILIZED TO PREVENT FALLS:  Life alert? No  Use of a cane, walker or w/c? No  Grab bars in the bathroom? No  Shower chair or bench in shower? No  Elevated toilet seat or a handicapped toilet? No   TIMED UP AND GO:  Was the test performed? Yes .  Length of time to ambulate 10 feet: 5 sec.   Gait steady and fast without use of assistive device  Cognitive Function:        07/06/2022    3:40 PM 06/17/2021    3:22 PM  6CIT Screen  What Year? 0 points 0 points  What month? 0 points 0 points  What time? 0 points 0 points  Count back from 20 0 points 0 points  Months in reverse 0 points 0 points  Repeat phrase 0 points 0 points  Total Score 0 points 0 points    Immunizations Immunization History  Administered Date(s) Administered   DTaP 09/09/2013   Fluad Quad(high Dose 65+) 04/14/2021  Influenza Inj Mdck Quad Pf 03/20/2019   Influenza,inj,Quad PF,6+ Mos 04/04/2012, 02/26/2017, 03/21/2018, 03/20/2019, 04/09/2020   Influenza-Unspecified 03/21/2018, 01/28/2022   Moderna Covid-19 Vaccine Bivalent Booster 31yr & up 07/26/2021   Moderna Sars-Covid-2 Vaccination 07/08/2019, 08/05/2019, 03/26/2020, 01/10/2021   Pneumococcal Conjugate-13 05/21/2018   Pneumococcal Polysaccharide-23 05/08/2019   Tdap 05/29/2012   Zoster Recombinat (Shingrix) 11/12/2020, 06/07/2021    TDAP status: Up to date  Flu Vaccine status: Up to date  Pneumococcal vaccine status: Up to date  Covid-19 vaccine status:  Completed vaccines  Qualifies for Shingles Vaccine? Yes   Zostavax completed Yes   Shingrix Completed?: Yes  Screening Tests Health Maintenance  Topic Date Due   COVID-19 Vaccine (6 - 2023-24 season) 01/27/2022   Diabetic kidney evaluation - Urine ACR  06/17/2022   Medicare Annual Wellness (AWV)  06/17/2022   FOOT EXAM  06/17/2022   HEMOGLOBIN A1C  11/09/2022   OPHTHALMOLOGY EXAM  02/08/2023   Diabetic kidney evaluation - eGFR measurement  05/11/2023   DTaP/Tdap/Td (3 - Td or Tdap) 09/10/2023   MAMMOGRAM  03/03/2024   Pneumonia Vaccine 67 Years old (375of 3 - PPSV23 or PCV20) 05/07/2024   COLONOSCOPY (Pts 45-442yrInsurance coverage will need to be confirmed)  02/08/2031   INFLUENZA VACCINE  Completed   DEXA SCAN  Completed   Hepatitis C Screening  Completed   Zoster Vaccines- Shingrix  Completed   HPV VACCINES  Aged Out    Health Maintenance  Health Maintenance Due  Topic Date Due   COVID-19 Vaccine (6 - 2023-24 season) 01/27/2022   Diabetic kidney evaluation - Urine ACR  06/17/2022   Medicare Annual Wellness (AWV)  06/17/2022   FOOT EXAM  06/17/2022    Colorectal cancer screening: Type of screening: Colonoscopy. Completed 02/07/2021. Repeat every 10 years  Mammogram status: Completed 03/03/2022. Repeat every year  Bone Density status: Completed 07/08/2021.   Lung Cancer Screening: (Low Dose CT Chest recommended if Age 67-80ears, 30 pack-year currently smoking OR have quit w/in 15years.) does not qualify.   Lung Cancer Screening Referral: no  Additional Screening:  Hepatitis C Screening: does qualify; Completed 02/24/2013  Vision Screening: Recommended annual ophthalmology exams for early detection of glaucoma and other disorders of the eye. Is the patient up to date with their annual eye exam?  Yes  Who is the provider or what is the name of the office in which the patient attends annual eye exams? [facility] is not established with a provider, would they  like to be referred to a provider to establish care? No .   Dental Screening: Recommended annual dental exams for proper oral hygiene  Community Resource Referral / Chronic Care Management: CRR required this visit?  No   CCM required this visit?  No      Plan:     I have personally reviewed and noted the following in the patient's chart:   Medical and social history Use of alcohol, tobacco or illicit drugs  Current medications and supplements including opioid prescriptions. Patient is not currently taking opioid prescriptions. Functional ability and status Nutritional status Physical activity Advanced directives List of other physicians Hospitalizations, surgeries, and ER visits in previous 12 months Vitals Screenings to include cognitive, depression, and falls Referrals and appointments  In addition, I have reviewed and discussed with patient certain preventive protocols, quality metrics, and best practice recommendations. A written personalized care plan for preventive services as well as general preventive health recommendations were provided to  patient.     Kellie Simmering, LPN   10/04/7329   Nurse Notes: none

## 2022-07-06 NOTE — Patient Instructions (Addendum)
[name] , Thank you for taking time to come for your Medicare Wellness Visit. I appreciate your ongoing commitment to your health goals. Please review the following plan we discussed and let me know if I can assist you in the future.   These are the goals we discussed:  Goals      DIET - EAT MORE FRUITS AND VEGETABLES     Increase physical activity     Manage My Medicine     Timeframe:  Long-Range Goal Priority:  High Start Date:                             Expected End Date:                       Follow Up Date 04/11/2022  In Progress:   - call for medicine refill 2 or 3 days before it runs out - call if I am sick and can't take my medicine - keep a list of all the medicines I take; vitamins and herbals too    Why is this important?   These steps will help you keep on track with your medicines.   Notes:  Please call with any questions or concerns, and fill out patient assistance paperwork that was mailed to you for next year.      Patient Stated     07/06/2022, increase muscle mass        This is a list of the screening recommended for you and due dates:  Health Maintenance  Topic Date Due   COVID-19 Vaccine (6 - 2023-24 season) 01/27/2022   Yearly kidney health urinalysis for diabetes  06/17/2022   Complete foot exam   06/17/2022   Hemoglobin A1C  11/09/2022   Eye exam for diabetics  02/08/2023   Yearly kidney function blood test for diabetes  05/11/2023   Medicare Annual Wellness Visit  07/07/2023   DTaP/Tdap/Td vaccine (3 - Td or Tdap) 09/10/2023   Mammogram  03/03/2024   Pneumonia Vaccine (3 of 3 - PPSV23 or PCV20) 05/07/2024   Colon Cancer Screening  02/08/2031   Flu Shot  Completed   DEXA scan (bone density measurement)  Completed   Hepatitis C Screening: USPSTF Recommendation to screen - Ages 38-79 yo.  Completed   Zoster (Shingles) Vaccine  Completed   HPV Vaccine  Aged Out    Advanced directives: Advance directive discussed with you today. I have  provided a copy for you to complete at home and have notarized. Once this is complete please bring a copy in to our office so we can scan it into your chart.  Conditions/risks identified: none  Next appointment: Follow up in one year for your annual wellness visit    Preventive Care 65 Years and Older, Female Preventive care refers to lifestyle choices and visits with your health care provider that can promote health and wellness. What does preventive care include? A yearly physical exam. This is also called an annual well check. Dental exams once or twice a year. Routine eye exams. Ask your health care provider how often you should have your eyes checked. Personal lifestyle choices, including: Daily care of your teeth and gums. Regular physical activity. Eating a healthy diet. Avoiding tobacco and drug use. Limiting alcohol use. Practicing safe sex. Taking low-dose aspirin every day. Taking vitamin and mineral supplements as recommended by your health care provider. What happens during an annual  well check? The services and screenings done by your health care provider during your annual well check will depend on your age, overall health, lifestyle risk factors, and family history of disease. Counseling  Your health care provider may ask you questions about your: Alcohol use. Tobacco use. Drug use. Emotional well-being. Home and relationship well-being. Sexual activity. Eating habits. History of falls. Memory and ability to understand (cognition). Work and work Statistician. Reproductive health. Screening  You may have the following tests or measurements: Height, weight, and BMI. Blood pressure. Lipid and cholesterol levels. These may be checked every 5 years, or more frequently if you are over 28 years old. Skin check. Lung cancer screening. You may have this screening every year starting at age 25 if you have a 30-pack-year history of smoking and currently smoke or have  quit within the past 15 years. Fecal occult blood test (FOBT) of the stool. You may have this test every year starting at age 6. Flexible sigmoidoscopy or colonoscopy. You may have a sigmoidoscopy every 5 years or a colonoscopy every 10 years starting at age 68. Hepatitis C blood test. Hepatitis B blood test. Sexually transmitted disease (STD) testing. Diabetes screening. This is done by checking your blood sugar (glucose) after you have not eaten for a while (fasting). You may have this done every 1-3 years. Bone density scan. This is done to screen for osteoporosis. You may have this done starting at age 53. Mammogram. This may be done every 1-2 years. Talk to your health care provider about how often you should have regular mammograms. Talk with your health care provider about your test results, treatment options, and if necessary, the need for more tests. Vaccines  Your health care provider may recommend certain vaccines, such as: Influenza vaccine. This is recommended every year. Tetanus, diphtheria, and acellular pertussis (Tdap, Td) vaccine. You may need a Td booster every 10 years. Zoster vaccine. You may need this after age 62. Pneumococcal 13-valent conjugate (PCV13) vaccine. One dose is recommended after age 62. Pneumococcal polysaccharide (PPSV23) vaccine. One dose is recommended after age 39. Talk to your health care provider about which screenings and vaccines you need and how often you need them. This information is not intended to replace advice given to you by your health care provider. Make sure you discuss any questions you have with your health care provider. Document Released: 06/11/2015 Document Revised: 02/02/2016 Document Reviewed: 03/16/2015 Elsevier Interactive Patient Education  2017 [employer] Prevention in the Home Falls can cause injuries. They can happen to people of all ages. There are many things you can do to make your home safe and to help prevent  falls. What can I do on the outside of my home? Regularly fix the edges of walkways and driveways and fix any cracks. Remove anything that might make you trip as you walk through a door, such as a raised step or threshold. Trim any bushes or trees on the path to your home. Use bright outdoor lighting. Clear any walking paths of anything that might make someone trip, such as rocks or tools. Regularly check to see if handrails are loose or broken. Make sure that both sides of any steps have handrails. Any raised decks and porches should have guardrails on the edges. Have any leaves, snow, or ice cleared regularly. Use sand or salt on walking paths during winter. Clean up any spills in your garage right away. This includes oil or grease spills. What can I do  in the bathroom? Use night lights. Install grab bars by the toilet and in the tub and shower. Do not use towel bars as grab bars. Use non-skid mats or decals in the tub or shower. If you need to sit down in the shower, use a plastic, non-slip stool. Keep the floor dry. Clean up any water that spills on the floor as soon as it happens. Remove soap buildup in the tub or shower regularly. Attach bath mats securely with double-sided non-slip rug tape. Do not have throw rugs and other things on the floor that can make you trip. What can I do in the bedroom? Use night lights. Make sure that you have a light by your bed that is easy to reach. Do not use any sheets or blankets that are too big for your bed. They should not hang down onto the floor. Have a firm chair that has side arms. You can use this for support while you get dressed. Do not have throw rugs and other things on the floor that can make you trip. What can I do in the kitchen? Clean up any spills right away. Avoid walking on wet floors. Keep items that you use a lot in easy-to-reach places. If you need to reach something above you, use a strong step stool that has a grab  bar. Keep electrical cords out of the way. Do not use floor polish or wax that makes floors slippery. If you must use wax, use non-skid floor wax. Do not have throw rugs and other things on the floor that can make you trip. What can I do with my stairs? Do not leave any items on the stairs. Make sure that there are handrails on both sides of the stairs and use them. Fix handrails that are broken or loose. Make sure that handrails are as long as the stairways. Check any carpeting to make sure that it is firmly attached to the stairs. Fix any carpet that is loose or worn. Avoid having throw rugs at the top or bottom of the stairs. If you do have throw rugs, attach them to the floor with carpet tape. Make sure that you have a light switch at the top of the stairs and the bottom of the stairs. If you do not have them, ask someone to add them for you. What else can I do to help prevent falls? Wear shoes that: Do not have high heels. Have rubber bottoms. Are comfortable and fit you well. Are closed at the toe. Do not wear sandals. If you use a stepladder: Make sure that it is fully opened. Do not climb a closed stepladder. Make sure that both sides of the stepladder are locked into place. Ask someone to hold it for you, if possible. Clearly mark and make sure that you can see: Any grab bars or handrails. First and last steps. Where the edge of each step is. Use tools that help you move around (mobility aids) if they are needed. These include: Canes. Walkers. Scooters. Crutches. Turn on the lights when you go into a dark area. Replace any light bulbs as soon as they burn out. Set up your furniture so you have a clear path. Avoid moving your furniture around. If any of your floors are uneven, fix them. If there are any pets around you, be aware of where they are. Review your medicines with your doctor. Some medicines can make you feel dizzy. This can increase your chance of falling. Ask  your  doctor what other things that you can do to help prevent falls. This information is not intended to replace advice given to you by your health care provider. Make sure you discuss any questions you have with your health care provider. Document Released: 03/11/2009 Document Revised: 10/21/2015 Document Reviewed: 06/19/2014 Elsevier Interactive Patient Education  2017 [employer].

## 2022-07-06 NOTE — Patient Instructions (Addendum)
DECREASE DOSE OF OZEMPIC DOWN TO 65 CLICKS (0.'5MG'$ )  Type 2 Diabetes Mellitus, Diagnosis, Adult Type 2 diabetes (type 2 diabetes mellitus) is a long-term (chronic) disease. It may happen when there is one or both of these problems: The pancreas does not make enough insulin. The body does not react in a normal way to insulin that it makes. Insulin lets sugars go into cells in your body. If you have type 2 diabetes, sugars cannot get into your cells. Sugars build up in the blood. This causes high blood sugar. What are the causes? The exact cause of this condition is not known. What increases the risk? Having type 2 diabetes in your family. Being overweight or very overweight. Not being active. Your body not reacting in a normal way to the insulin it makes. Having higher than normal blood sugar over time. Having a type of diabetes when you were pregnant. Having a condition that causes small fluid-filled sacs on your ovaries. What are the signs or symptoms? At first, you may have no symptoms. You will get symptoms slowly. They may include: More thirst than normal. More hunger than normal. Needing to pee more than normal. Losing weight without trying. Feeling tired. Feeling weak. Seeing things blurry. Dark patches on your skin. How is this treated? This condition may be treated by a diabetes expert. You may need to: Follow an eating plan made by a food expert (dietitian). Get regular exercise. Find ways to deal with stress. Check blood sugar as often as told. Take medicines. Your doctor will set treatment goals for you. Your blood sugar should be at these levels: Before meals: 80-130 mg/dL (4.4-7.2 mmol/L). After meals: below 180 mg/dL (10 mmol/L). Over the last 2-3 months: less than 7%. Follow these instructions at home: Medicines Take your diabetes medicines or insulin every day. Take medicines as told to help you prevent other problems caused by this condition. You may  need: Aspirin. Medicine to lower cholesterol. Medicine to control blood pressure. Questions to ask your doctor Should I meet with a diabetes educator? What medicines do I need, and when should I take them? What will I need to treat my condition at home? When should I check my blood sugar? Where can I find a support group? Who can I call if I have questions? When is my next doctor visit? General instructions Take over-the-counter and prescription medicines only as told by your doctor. Keep all follow-up visits. Where to find more information For help and guidance and more information about diabetes, please go to: American Diabetes Association (ADA): www.diabetes.org American Association of Diabetes Care and Education Specialists (ADCES): www.diabeteseducator.org International Diabetes Federation (IDF): MemberVerification.ca Contact a doctor if: Your blood sugar is at or above 240 mg/dL (13.3 mmol/L) for 2 days in a row. You have been sick for 2 days or more, and you are not getting better. You have had a fever for 2 days or more, and you are not getting better. You have any of these problems for more than 6 hours: You cannot eat or drink. You feel like you may vomit. You vomit. You have watery poop (diarrhea). Get help right away if: Your blood sugar is lower than 54 mg/dL (3 mmol/L). You feel mixed up (confused). You have trouble thinking clearly. You have trouble breathing. You have medium or large ketone levels in your pee. These symptoms may be an emergency. Get help right away. Call your local emergency services (911 in the [place] Do not wait to see  if the symptoms will go away. Do not drive yourself to the hospital. Summary Type 2 diabetes is a long-term disease. Your pancreas may not make enough insulin, or your body may not react in a normal way to insulin that it makes. This condition is treated with an eating plan, lifestyle changes, and medicines. Your doctor will set  treatment goals for you. These will help you keep your blood sugar in a healthy range. Keep all follow-up visits. This information is not intended to replace advice given to you by your health care provider. Make sure you discuss any questions you have with your health care provider. Document Revised: 08/09/2020 Document Reviewed: 08/09/2020 Elsevier Patient Education  [place].

## 2022-07-08 LAB — MICROALBUMIN / CREATININE URINE RATIO
Creatinine, Urine: 37.6 mg/dL
Microalb/Creat Ratio: <8 mg/g creat (ref 0–29)
Microalbumin, Urine: <3 ug/mL

## 2022-09-06 ENCOUNTER — Other Ambulatory Visit: Payer: Self-pay | Admitting: Nurse Practitioner

## 2022-09-14 ENCOUNTER — Encounter: Payer: Self-pay | Admitting: Internal Medicine

## 2022-09-14 ENCOUNTER — Ambulatory Visit (INDEPENDENT_AMBULATORY_CARE_PROVIDER_SITE_OTHER): Payer: Medicare Other | Admitting: Internal Medicine

## 2022-09-14 VITALS — BP 110/68 | HR 84 | Temp 98.8°F | Ht 64.0 in | Wt 145.6 lb

## 2022-09-14 DIAGNOSIS — E559 Vitamin D deficiency, unspecified: Secondary | ICD-10-CM

## 2022-09-14 DIAGNOSIS — R198 Other specified symptoms and signs involving the digestive system and abdomen: Secondary | ICD-10-CM | POA: Diagnosis not present

## 2022-09-14 DIAGNOSIS — K5900 Constipation, unspecified: Secondary | ICD-10-CM

## 2022-09-14 DIAGNOSIS — I10 Essential (primary) hypertension: Secondary | ICD-10-CM | POA: Diagnosis not present

## 2022-09-14 DIAGNOSIS — E1140 Type 2 diabetes mellitus with diabetic neuropathy, unspecified: Secondary | ICD-10-CM | POA: Diagnosis not present

## 2022-09-14 DIAGNOSIS — L989 Disorder of the skin and subcutaneous tissue, unspecified: Secondary | ICD-10-CM

## 2022-09-14 DIAGNOSIS — Z794 Long term (current) use of insulin: Secondary | ICD-10-CM

## 2022-09-14 NOTE — Patient Instructions (Addendum)
54 clicks  Type 2 Diabetes Mellitus, Diagnosis, Adult Type 2 diabetes (type 2 diabetes mellitus) is a long-term (chronic) disease. It may happen when there is one or both of these problems: The pancreas does not make enough insulin. The body does not react in a normal way to insulin that it makes. Insulin lets sugars go into cells in your body. If you have type 2 diabetes, sugars cannot get into your cells. Sugars build up in the blood. This causes high blood sugar. What are the causes? The exact cause of this condition is not known. What increases the risk? Having type 2 diabetes in your family. Being overweight or very overweight. Not being active. Your body not reacting in a normal way to the insulin it makes. Having higher than normal blood sugar over time. Having a type of diabetes when you were pregnant. Having a condition that causes small fluid-filled sacs on your ovaries. What are the signs or symptoms? At first, you may have no symptoms. You will get symptoms slowly. They may include: More thirst than normal. More hunger than normal. Needing to pee more than normal. Losing weight without trying. Feeling tired. Feeling weak. Seeing things blurry. Dark patches on your skin. How is this treated? This condition may be treated by a diabetes expert. You may need to: Follow an eating plan made by a food expert (dietitian). Get regular exercise. Find ways to deal with stress. Check blood sugar as often as told. Take medicines. Your doctor will set treatment goals for you. Your blood sugar should be at these levels: Before meals: 80-130 mg/dL (1.6-1.0 mmol/L). After meals: below 180 mg/dL (10 mmol/L). Over the last 2-3 months: less than 7%. Follow these instructions at home: Medicines Take your diabetes medicines or insulin every day. Take medicines as told to help you prevent other problems caused by this condition. You may need: Aspirin. Medicine to lower  cholesterol. Medicine to control blood pressure. Questions to ask your doctor Should I meet with a diabetes educator? What medicines do I need, and when should I take them? What will I need to treat my condition at home? When should I check my blood sugar? Where can I find a support group? Who can I call if I have questions? When is my next doctor visit? General instructions Take over-the-counter and prescription medicines only as told by your doctor. Keep all follow-up visits. Where to find more information For help and guidance and more information about diabetes, please go to: American Diabetes Association (ADA): www.diabetes.org American Association of Diabetes Care and Education Specialists (ADCES): www.diabeteseducator.org International Diabetes Federation (IDF): DCOnly.dk Contact a doctor if: Your blood sugar is at or above 240 mg/dL (96.0 mmol/L) for 2 days in a row. You have been sick for 2 days or more, and you are not getting better. You have had a fever for 2 days or more, and you are not getting better. You have any of these problems for more than 6 hours: You cannot eat or drink. You feel like you may vomit. You vomit. You have watery poop (diarrhea). Get help right away if: Your blood sugar is lower than 54 mg/dL (3 mmol/L). You feel mixed up (confused). You have trouble thinking clearly. You have trouble breathing. You have medium or large ketone levels in your pee. These symptoms may be an emergency. Get help right away. Call your local emergency services (911 in the [place] Do not wait to see if the symptoms will go away. Do  not drive yourself to the hospital. Summary Type 2 diabetes is a long-term disease. Your pancreas may not make enough insulin, or your body may not react in a normal way to insulin that it makes. This condition is treated with an eating plan, lifestyle changes, and medicines. Your doctor will set treatment goals for you. These will help  you keep your blood sugar in a healthy range. Keep all follow-up visits. This information is not intended to replace advice given to you by your health care provider. Make sure you discuss any questions you have with your health care provider. Document Revised: 08/09/2020 Document Reviewed: 08/09/2020 Elsevier Patient Education  [place].

## 2022-09-14 NOTE — Progress Notes (Signed)
I,Sylvia Kelly,acting as a scribe for Sylvia Aliment, MD.,have documented all relevant documentation on the behalf of Sylvia Aliment, MD,as directed by  Sylvia Aliment, MD while in the presence of Sylvia Aliment, MD.    Subjective:     Patient ID: Sylvia Kelly , female    DOB: 02-Jan-1956 , 67 y.o.   MRN: [id number]   Chief Complaint  Patient presents with   Diabetes   Hypertension   Hyperlipidemia    HPI  She presents today for DM/HTN follow-up.  She reports compliance with meds.  She denies headaches, chest pain and shortness of breath. She states she wanted to decrease dose of Ozempic; however, she plans to return to the  because her sugars did increase to the 300s when she did this.   Lastly, she reports a recent bout with constipation. Last night she states having to give herself an enema. She states she had minimal results. She does not eat apples on a regular basis. She plans to get a Squatty Potty in the near future.       Diabetes She presents for her follow-up diabetic visit. She has type 2 diabetes mellitus. Her disease course has been improving. There are no hypoglycemic associated symptoms. Pertinent negatives for diabetes include no blurred vision, no chest pain, no fatigue, no polydipsia, no polyphagia and no polyuria. There are no hypoglycemic complications. Diabetic complications include peripheral neuropathy. Risk factors for coronary artery disease include diabetes mellitus, dyslipidemia, sedentary lifestyle, hypertension and post-menopausal.  Hypertension This is a chronic problem. The current episode started more than 1 year ago. The problem has been gradually improving since onset. Pertinent negatives include no blurred vision, chest pain, palpitations or shortness of breath.  Hyperlipidemia Pertinent negatives include no chest pain or shortness of breath.     Past Medical History:  Diagnosis Date   Allergy    Arthritis    hip   Atrophic  vaginitis    Diabetes mellitus    Elevated cholesterol    Hypertension    Neuromuscular disorder    neuropathy hands and feet     Family History  Problem Relation Age of Onset   Heart disease Mother    Diabetes Mother    Cancer Father        Lung cancer   Colon cancer Neg Hx    Colon polyps Neg Hx    Esophageal cancer Neg Hx    Rectal cancer Neg Hx    Stomach cancer Neg Hx      Current Outpatient Medications:    aspirin 81 MG tablet, Take 81 mg by mouth daily., Disp: , Rfl:    atorvastatin (LIPITOR) 80 MG tablet, TAKE 1 TABLET BY MOUTH EVERY DAY, Disp: 90 tablet, Rfl: 1   dapagliflozin propanediol (FARXIGA) 10 MG TABS tablet, Take by mouth daily., Disp: , Rfl:    fluticasone (FLONASE) 50 MCG/ACT nasal spray, SPRAY 1 SPRAY INTO EACH NOSTRIL EVERY DAY, Disp: 16 g, Rfl: 0   insulin degludec (TRESIBA FLEXTOUCH) 200 UNIT/ML FlexTouch Pen, Inject 38 Units into the skin at bedtime., Disp: 27 mL, Rfl: 2   Insulin Pen Needle (B-D UF III MINI PEN NEEDLES) 31G X 5 MM MISC, USE AS DIRECTED WITH LEVEMIR, Disp: 150 each, Rfl: 3   lisinopril-hydrochlorothiazide (ZESTORETIC) 20-25 MG tablet, TAKE 1/2 TABLET BY MOUTH EVERY DAY, Disp: 45 tablet, Rfl: 2   Multiple Vitamin (MULTIVITAMIN) tablet, Take 1 tablet by mouth daily., Disp: , Rfl:  OZEMPIC, 1 MG/DOSE, 4 MG/3ML SOPN, INJECT 1MG  SUBCUTANEOUSLY  ONCE A WEEK, Disp: 3 mL, Rfl: 2   pantoprazole (PROTONIX) 20 MG tablet, TAKE 1 TABLET BY MOUTH EVERY DAY, Disp: 90 tablet, Rfl: 1   triamcinolone cream (KENALOG) 0.1 %, APPLY TO AFFECTED AREA TWICE DAILY AS NEEDED, Disp: 30 g, Rfl: 1  Current Facility-Administered Medications:    dextrose 5 % solution, , Intravenous, Continuous, Nandigam, Kavitha V, MD   No Known Allergies   Review of Systems  Constitutional: Negative.  Negative for fatigue.  Eyes:  Negative for blurred vision.  Respiratory: Negative.  Negative for shortness of breath.   Cardiovascular: Negative.  Negative for chest pain and  palpitations.  Gastrointestinal:  Positive for constipation.       She also mentions having loud, rumbling sounds from her stomach. No pain.   Endocrine: Negative for polydipsia, polyphagia and polyuria.  Skin: Negative.   Neurological: Negative.   Psychiatric/Behavioral: Negative.       Today's Vitals   09/14/22 1136  BP: 110/68  Pulse: 84  Temp: 98.8 F (37.1 C)  SpO2: 98%  Weight: 145 lb 9.6 oz (66 kg)  Height: 5\' 4"  (1.626 m)   Body mass index is 24.99 kg/m.  Wt Readings from Last 3 Encounters:  09/14/22 145 lb 9.6 oz (66 kg)  07/06/22 147 lb (66.7 kg)  07/06/22 147 lb 3.2 oz (66.8 kg)    Objective:  Physical Exam Vitals and nursing note reviewed.  Constitutional:      Appearance: Normal appearance.  HENT:     Head: Normocephalic and atraumatic.  Eyes:     Extraocular Movements: Extraocular movements intact.  Cardiovascular:     Rate and Rhythm: Normal rate and regular rhythm.     Heart sounds: Normal heart sounds.  Pulmonary:     Effort: Pulmonary effort is normal.     Breath sounds: Normal breath sounds.  Skin:    General: Skin is warm.     Comments: Scattered hyperpigmented scaly lesions on lower abdomen  Neurological:     General: No focal deficit present.     Mental Status: She is alert.  Psychiatric:        Mood and Affect: Mood normal.        Behavior: Behavior normal.         Assessment And Plan:     1. Type 2 diabetes mellitus with diabetic neuropathy, with long-term current use of insulin Comments: Chronic, she plans to resume 0.75mg  Ozempic (54 clicks) x2 weeks, then resume 1mg  weekly. I will check labs as below. Encouraged to decrease juice intake. - CMP14+EGFR - Hemoglobin A1c  2. Essential (primary) hypertension Comments: Chronic, controlled. She will c/w lisinopril/hctz 20/25mg  daily.  3. Skin lesion Comments: Suggestive of seborrheic keratoses. No treatment needed at this time.  4. Borborygmus Comments: Patient given reassurance.  She may benefit from taking a quick walk after meals to help with this symptom.  5. Constipation, unspecified constipation type Comments: Advised to try Dulcolax suppository and Miralax. She will let me know if her sx persist. She was advised to perform abdominal massage nightly.  6. Vitamin D deficiency disease Comments: I will check vitamin D level and supplement as needed. - Vitamin D (25 hydroxy)   Patient was given opportunity to ask questions. Patient verbalized understanding of the plan and was able to repeat key elements of the plan. All questions were answered to their satisfaction.   I, Sylvia Aliment, MD, have reviewed  all documentation for this visit. The documentation on 09/14/22 for the exam, diagnosis, procedures, and orders are all accurate and complete.   IF YOU HAVE BEEN REFERRED TO A SPECIALIST, IT MAY TAKE 1-2 WEEKS TO SCHEDULE/PROCESS THE REFERRAL. IF YOU HAVE NOT HEARD FROM US/SPECIALIST IN TWO WEEKS, PLEASE GIVE [place] A CALL AT [phone].   THE PATIENT IS ENCOURAGED TO PRACTICE SOCIAL DISTANCING DUE TO THE COVID-19 PANDEMIC.

## 2022-09-15 LAB — CMP14+EGFR
ALT: 18 IU/L (ref 0–32)
AST: 24 IU/L (ref 0–40)
Albumin/Globulin Ratio: 1.3 (ref 1.2–2.2)
Albumin: 4.1 g/dL (ref 3.9–4.9)
Alkaline Phosphatase: 116 IU/L (ref 44–121)
BUN/Creatinine Ratio: 20 (ref 12–28)
BUN: 18 mg/dL (ref 8–27)
Bilirubin Total: 0.3 mg/dL (ref 0.0–1.2)
CO2: 24 mmol/L (ref 20–29)
Calcium: 9.3 mg/dL (ref 8.7–10.3)
Chloride: 96 mmol/L (ref 96–106)
Creatinine, Ser: 0.91 mg/dL (ref 0.57–1.00)
Globulin, Total: 3.1 g/dL (ref 1.5–4.5)
Glucose: 186 mg/dL — ABNORMAL HIGH (ref 70–99)
Potassium: 4.1 mmol/L (ref 3.5–5.2)
Sodium: 138 mmol/L (ref 134–144)
Total Protein: 7.2 g/dL (ref 6.0–8.5)
eGFR: 70 mL/min/{1.73_m2} (ref >59)

## 2022-09-15 LAB — HEMOGLOBIN A1C
Est. average glucose Bld gHb Est-mCnc: 209 mg/dL
Hgb A1c MFr Bld: 8.9 % — ABNORMAL HIGH (ref 4.8–5.6)

## 2022-09-15 LAB — VITAMIN D 25 HYDROXY (VIT D DEFICIENCY, FRACTURES): Vit D, 25-Hydroxy: 28.1 ng/mL — ABNORMAL LOW (ref 30.0–100.0)

## 2022-09-29 ENCOUNTER — Telehealth: Payer: Self-pay

## 2022-09-29 NOTE — Progress Notes (Signed)
Care Management & Coordination Services Pharmacy Team  Reason for Encounter: Appointment Reminder  Contacted patient to confirm telephone appointment with Cherylin Mylar, PharmD on 10-03-2022 at 3:00. Unsuccessful outreach. Left voicemail for patient to return call.   Chart review: Recent office visits:  09-14-2022 Dorothyann Peng, MD. Follow up visit for Diabetes. Stop hydroxyzine.  07-06-2022 Barb Merino, LPN. Medicare wellness visit.   07-06-2022 Dorothyann Peng, MD. Follow up visit for Diabetes. No changes.  Recent consult visits:  None  Hospital visits:  None in previous 6 months   Star Rating Drugs:  Atorvastatin 80 mg- Last filled 07-24-2022 90 [facility]. Previous 04-14-2022 90 DS Ozempic 1 mg- Last filled 09-29-2022 28 [facility]. Previous 09-06-2022 28 DS.  Farxiga 10 mg- PAP Lisinopril/HCTZ 20-25 mg- Last filled 04-28-2022 90 [facility]. Previous 04-07-2022 90 DS. Tried to confirm last fill but was unable to hold longer than 10 minutes.  Care Gaps: Annual wellness visit in last year? Yes Covid vaccine overdue Foot exam overdue  If Diabetic: Last eye exam / retinopathy screening: 02-07-2022 Last diabetic foot exam: None   Huey Romans [facility] Clinical Pharmacist Assistant [phone]

## 2022-10-01 ENCOUNTER — Ambulatory Visit (HOSPITAL_COMMUNITY)
Admission: EM | Admit: 2022-10-01 | Discharge: 2022-10-01 | Disposition: A | Discharge status: Home / Self Care | Payer: Medicare Other | Attending: Family Medicine | Admitting: Family Medicine

## 2022-10-01 ENCOUNTER — Other Ambulatory Visit: Payer: Self-pay

## 2022-10-01 ENCOUNTER — Encounter (HOSPITAL_COMMUNITY): Payer: Self-pay | Admitting: Emergency Medicine

## 2022-10-01 DIAGNOSIS — H00039 Abscess of eyelid unspecified eye, unspecified eyelid: Secondary | ICD-10-CM | POA: Diagnosis not present

## 2022-10-01 DIAGNOSIS — H1013 Acute atopic conjunctivitis, bilateral: Secondary | ICD-10-CM

## 2022-10-01 MED ORDER — OLOPATADINE HCL 0.1 % OP SOLN: 1.0000 [drp] | Freq: Two times a day (BID) | OPHTHALMIC | 0 refills | Status: DC

## 2022-10-01 MED ORDER — CEPHALEXIN 250 MG PO CAPS: 250.0000 mg | ORAL_CAPSULE | Freq: Three times a day (TID) | ORAL | 0 refills | Status: AC

## 2022-10-01 NOTE — ED Triage Notes (Signed)
09/24/2022  noticed a headache around eyes, then started itching and then noticed crustiness.  Both eyes are red.  No runny nose or cough.    Patient has not used any eye drops

## 2022-10-01 NOTE — ED Provider Notes (Signed)
[facility]    CSN: [id number] Arrival date & time: 10/01/22  1111      History   Chief Complaint Chief Complaint  Patient presents with   Eye Problem    HPI Sylvia Kelly is a 67 y.o. female.    Eye Problem  Here for itching in both eyes.  Began about 6 or 7 days ago.  This was after she had been sitting outside around a bunch of trees are blooming at a party.  She now notes some swelling in both lower eyelids and the left one is red.  There is no injection of either eye, but they have been itching a lot.  She does have diabetes, and she states it has been well-controlled  Past Medical History:  Diagnosis Date   Allergy    Arthritis    hip   Atrophic vaginitis    Diabetes mellitus    Elevated cholesterol    Hypertension    Neuromuscular disorder (HCC)    neuropathy hands and feet    Patient Active Problem List   Diagnosis Date Noted   Skin lesion 09/14/2022   Borborygmus 09/14/2022   Constipation 09/14/2022   Vitamin D insufficiency 06/24/2021   Body mass index (BMI) of 25.0 to 25.9 in adult 06/24/2021   Pseudopolyposis of colon without complication, unspecified part of colon (HCC) 06/17/2021   Multinodular goiter 11/12/2020   Paresthesia and pain of extremity 11/12/2020   Female climacteric state 01/04/2017   Other long term (current) drug therapy 01/04/2017   Essential (primary) hypertension    Type 2 diabetes mellitus with diabetic neuropathy, unspecified (HCC)    Elevated cholesterol     Past Surgical History:  Procedure Laterality Date   COLONOSCOPY     10 yrs ago- [employer] - normal per pt   OVARIAN CYST SURGERY  05/29/1990   REFRACTIVE SURGERY     TUBAL LIGATION      OB History     Gravida  2   Para  1   Term  1   Preterm      AB  1   Living  1      SAB      IAB      Ectopic      Multiple      Live Births               Home Medications    Prior to Admission medications   Medication Sig Start  Date End Date Taking? Authorizing Provider  cephALEXin (KEFLEX) 250 MG capsule Take 1 capsule (250 mg total) by mouth 3 (three) times daily for 7 days. 10/01/22 10/08/22 Yes Zenia Resides, MD  famotidine (PEPCID) 10 MG tablet Take 10 mg by mouth 2 (two) times daily.   Yes [provider]  olopatadine (PATANOL) 0.1 % ophthalmic solution Place 1 drop into both eyes 2 (two) times daily. 10/01/22  Yes Zenia Resides, MD  aspirin 81 MG tablet Take 81 mg by mouth daily.    [provider]  atorvastatin (LIPITOR) 80 MG tablet TAKE 1 TABLET BY MOUTH EVERY DAY 04/14/22   Dorothyann Peng, MD  dapagliflozin propanediol (FARXIGA) 10 MG TABS tablet Take by mouth daily.    [provider]  fluticasone (FLONASE) 50 MCG/ACT nasal spray SPRAY 1 SPRAY INTO EACH NOSTRIL EVERY DAY 12/05/21   Raspet, Erin K, PA-C  insulin degludec (TRESIBA FLEXTOUCH) 200 UNIT/ML FlexTouch Pen Inject 38 Units into the skin at  bedtime. 06/21/22   Arnette Felts, FNP  Insulin Pen Needle (B-D UF III MINI PEN NEEDLES) 31G X 5 MM MISC USE AS DIRECTED WITH LEVEMIR 02/17/22   Dorothyann Peng, MD  lisinopril-hydrochlorothiazide (ZESTORETIC) 20-25 MG tablet TAKE 1/2 TABLET BY MOUTH EVERY DAY 04/07/22   Dorothyann Peng, MD  Multiple Vitamin (MULTIVITAMIN) tablet Take 1 tablet by mouth daily.    [provider]  OZEMPIC, 1 MG/DOSE, 4 MG/3ML SOPN INJECT 1MG  SUBCUTANEOUSLY  ONCE A WEEK 09/06/22   Arnette Felts, FNP  pantoprazole (PROTONIX) 20 MG tablet TAKE 1 TABLET BY MOUTH EVERY DAY Patient not taking: Reported on 10/01/2022 03/16/22   Dorothyann Peng, MD  triamcinolone cream (KENALOG) 0.1 % APPLY TO AFFECTED AREA TWICE DAILY AS NEEDED Patient not taking: Reported on 10/01/2022 02/15/22   Dorothyann Peng, MD    Family History Family History  Problem Relation Age of Onset   Heart disease Mother    Diabetes Mother    Cancer Father        Lung cancer   Colon cancer Neg Hx    Colon polyps Neg Hx    Esophageal cancer  Neg Hx    Rectal cancer Neg Hx    Stomach cancer Neg Hx     Social History Social History   Tobacco Use   Smoking status: Never   Smokeless tobacco: Never  Vaping Use   Vaping Use: Never used  Substance Use Topics   Alcohol use: No   Drug use: No     Allergies   Patient has no known allergies.   Review of Systems Review of Systems   Physical Exam Triage Vital Signs ED Triage Vitals  Enc Vitals Group     BP 10/01/22 1203 109/72     Pulse Rate 10/01/22 1203 82     Resp 10/01/22 1203 20     Temp 10/01/22 1203 98.5 F (36.9 C)     Temp Source 10/01/22 1203 Oral     SpO2 10/01/22 1203 98 %     Weight --      Height --      Head Circumference --      Peak Flow --      Pain Score 10/01/22 1159 4     Pain Loc --      Pain Edu? --      Excl. in GC? --    No data found.  Updated Vital Signs BP 109/72 (BP Location: Right Arm)   Pulse 82   Temp 98.5 F (36.9 C) (Oral)   Resp 20   SpO2 98%   Visual Acuity Right Eye Distance:   Left Eye Distance:   Bilateral Distance:    Right Eye Near:   Left Eye Near:    Bilateral Near:     Physical Exam Vitals reviewed.  Constitutional:      General: She is not in acute distress.    Appearance: She is not ill-appearing, toxic-appearing or diaphoretic.  HENT:     Nose: Nose normal.     Mouth/Throat:     Mouth: Mucous membranes are moist.  Eyes:     Extraocular Movements: Extraocular movements intact.     Conjunctiva/sclera: Conjunctivae normal.     Pupils: Pupils are equal, round, and reactive to light.     Comments: The left lower eyelid is mildly swollen and has some erythema on the lateral portion.  I do not see an obvious hordeolum developing at this time.  There is a little  bit of swelling of the right lower eyelid, but no erythema there.  At this time I do not see any crusting on the eyelids  Skin:    Coloration: Skin is not pale.  Neurological:     Mental Status: She is alert and oriented to person,  place, and time.  Psychiatric:        Behavior: Behavior normal.      UC Treatments / Results  Labs (all labs ordered are listed, but only abnormal results are displayed) Labs Reviewed - No data to display  EKG   Radiology No results found.  Procedures Procedures (including critical care time)  Medications Ordered in UC Medications - No data to display  Initial Impression / Assessment and Plan / UC Course  I have reviewed the triage vital signs and the nursing notes.  Pertinent labs & imaging results that were available during my care of the patient were reviewed by me and considered in my medical decision making (see chart for details).        I am going to send in Keflex for the cellulitis in the eyelids.  Patanol was sent in for possible allergic conjunctivitis.   Final Clinical Impressions(s) / UC Diagnoses   Final diagnoses:  Cellulitis of eyelid  Allergic conjunctivitis of both eyes     Discharge Instructions      Take cephalexin 250 mg--1 capsule 3 times daily for 7 days  Put Patanol/olopatadine drops in both eyes twice daily for allergies.  He could also take Zyrtec/cetirizine as needed for allergies     ED Prescriptions     Medication Sig Dispense Auth. Provider   cephALEXin (KEFLEX) 250 MG capsule Take 1 capsule (250 mg total) by mouth 3 (three) times daily for 7 days. 21 capsule Zenia Resides, MD   olopatadine (PATANOL) 0.1 % ophthalmic solution Place 1 drop into both eyes 2 (two) times daily. 5 mL Zenia Resides, MD      PDMP not reviewed this encounter.   Zenia Resides, MD 10/01/22 1240

## 2022-10-01 NOTE — Discharge Instructions (Signed)
Take cephalexin 250 mg--1 capsule 3 times daily for 7 days  Put Patanol/olopatadine drops in both eyes twice daily for allergies.  He could also take Zyrtec/cetirizine as needed for allergies

## 2022-10-03 ENCOUNTER — Ambulatory Visit: Payer: Medicare Other

## 2022-10-03 NOTE — Progress Notes (Signed)
Care Management & Coordination Services Pharmacy Note  10/03/2022 Name:  Sylvia Kelly MRN:  [id number] DOB:  [birth year]  Summary: Patient reports that she has been doing well but recently div  Recommendations/Changes made from today's visit: Recommend patient continue to check bs at least three bs checks per day, she is checking one or 1-2 times per day Continue doing Yes2next which is a series of exercise for muscle and exercise. It is a Psychologist, educational and her mother who she finds it easy to do. She is going to continue to do 30 minutes every day for the next month.  Follow up plan: Patient to have a follow up visit in one month.  HC to reach patient on Friday to review BS readings, and again in another 4 days for a diabetes assessment.     Subjective: Sylvia Kelly is an 67 y.o. year old female who is a primary patient of Dorothyann Peng, MD.  The care coordination team was consulted for assistance with disease management and care coordination needs.    Engaged with patient by telephone for follow up visit.  Recent office visits:  09-14-2022 Dorothyann Peng, MD. Follow up visit for Diabetes. Stop hydroxyzine.   07-06-2022 Barb Merino, LPN. Medicare wellness visit.    07-06-2022 Dorothyann Peng, MD. Follow up visit for Diabetes. No changes.   Recent consult visits:  None   Hospital visits:  None in previous 6 months   Objective:  Lab Results  Component Value Date   CREATININE 0.91 09/14/2022   BUN 18 09/14/2022   EGFR 70 09/14/2022   GFRNONAA 75 06/09/2020   GFRAA 86 06/09/2020   NA 138 09/14/2022   K 4.1 09/14/2022   CALCIUM 9.3 09/14/2022   CO2 24 09/14/2022   GLUCOSE 186 (H) 09/14/2022    Lab Results  Component Value Date/Time   HGBA1C 8.9 (H) 09/14/2022 12:28 PM   HGBA1C 6.9 (H) 05/10/2022 04:44 PM   HGBA1C 7.7 01/16/2018 12:00 AM   MICROALBUR 30 11/10/2020 12:48 PM   MICROALBUR 30 11/04/2019 11:00 AM    Last diabetic Eye exam:  Lab Results   Component Value Date/Time   HMDIABEYEEXA No Retinopathy 02/07/2022 12:00 AM    Last diabetic Foot exam: No results found for: "HMDIABFOOTEX"   Lab Results  Component Value Date   CHOL 168 02/01/2022   HDL 63 02/01/2022   LDLCALC 84 02/01/2022   TRIG 121 02/01/2022   CHOLHDL 2.7 02/01/2022       Latest Ref Rng & Units 09/14/2022   12:28 PM 02/01/2022    5:00 PM 06/17/2021    4:24 PM  Hepatic Function  Total Protein 6.0 - 8.5 g/dL 7.2  7.4  7.0   Albumin 3.9 - 4.9 g/dL 4.1  4.6  4.7   AST 0 - 40 IU/L 24  24  21    ALT 0 - 32 IU/L 18  23  19    Alk Phosphatase 44 - 121 IU/L 116  117  125   Total Bilirubin 0.0 - 1.2 mg/dL 0.3  0.3  0.2     Lab Results  Component Value Date/Time   TSH 0.60 10/10/2017 12:00 AM       Latest Ref Rng & Units 02/01/2022    5:00 PM 06/17/2021    4:24 PM 11/04/2019   10:08 AM  CBC  WBC 3.4 - 10.8 x10E3/uL 4.9  4.6  3.6   Hemoglobin 11.1 - 15.9 g/dL 46.9  62.9  52.8   Hematocrit  34.0 - 46.6 % 39.9  38.1  36.8   Platelets 150 - 450 x10E3/uL 312  305  285     Lab Results  Component Value Date/Time   VD25OH 28.1 (L) 09/14/2022 12:28 PM   VD25OH 20.6 (L) 06/17/2021 04:24 PM   VITAMINB12 509 06/17/2021 04:24 PM    Clinical ASCVD: No  The 10-year ASCVD risk score (Arnett DK, et al., 2019) is: 13%   Values used to calculate the score:     Age: 67 years     Sex: Female     Is Non-Hispanic African American: Yes     Diabetic: Yes     Tobacco smoker: No     Systolic Blood Pressure: 109 mmHg     Is BP treated: Yes     HDL Cholesterol: 63 mg/dL     Total Cholesterol: 168 mg/dL        4/54/0981   19:14 AM 07/06/2022    3:39 PM 07/06/2022    3:04 PM  Depression screen PHQ 2/9  Decreased Interest 0 0 0  Down, Depressed, Hopeless 0 0 0  PHQ - 2 Score 0 0 0  Altered sleeping 0    Tired, decreased energy 0    Change in appetite 0    Feeling bad or failure about yourself  0    Trouble concentrating 0    Moving slowly or fidgety/restless 0     Suicidal thoughts 0    PHQ-9 Score 0    Difficult doing work/chores Not difficult at all       Social History   Tobacco Use  Smoking Status Never  Smokeless Tobacco Never   BP Readings from Last 3 Encounters:  10/01/22 109/72  09/14/22 110/68  07/06/22 106/72   Pulse Readings from Last 3 Encounters:  10/01/22 82  09/14/22 84  07/06/22 60   Wt Readings from Last 3 Encounters:  09/14/22 145 lb 9.6 oz (66 kg)  07/06/22 147 lb (66.7 kg)  07/06/22 147 lb 3.2 oz (66.8 kg)   BMI Readings from Last 3 Encounters:  09/14/22 24.99 kg/m  07/06/22 25.23 kg/m  07/06/22 25.27 kg/m    No Known Allergies  Medications Reviewed Today     Reviewed by Liberty Handy, RN (Registered Nurse) on 10/01/22 at 1202  Med List Status: <None>   Medication Order Taking? Sig Documenting Provider Last Dose Status Informant  aspirin 81 MG tablet [id number]  Take 81 mg by mouth daily. [provider]  Active   atorvastatin (LIPITOR) 80 MG tablet [id number]  TAKE 1 TABLET BY MOUTH EVERY DAY Dorothyann Peng, MD  Active   dapagliflozin propanediol (FARXIGA) 10 MG TABS tablet [id number]  Take by mouth daily. [provider]  Active   dextrose 5 % solution [id number]   Napoleon Form, MD  Active   famotidine (PEPCID) 10 MG tablet [id number] Yes Take 10 mg by mouth 2 (two) times daily. [provider]  Active   fluticasone (FLONASE) 50 MCG/ACT nasal spray [id number]  SPRAY 1 SPRAY INTO EACH NOSTRIL EVERY DAY Raspet, Erin K, PA-C  Active   insulin degludec (TRESIBA FLEXTOUCH) 200 UNIT/ML FlexTouch Pen [id number]  Inject 38 Units into the skin at bedtime. Arnette Felts, FNP  Active   Insulin Pen Needle (B-D UF III MINI PEN NEEDLES) 31G X 5 MM MISC [id number]  USE AS DIRECTED WITH Corene Cornea, MD  Active   lisinopril-hydrochlorothiazide (ZESTORETIC) 20-25 MG tablet [id number]  TAKE 1/2 TABLET BY MOUTH  EVERY DAY Dorothyann Peng, MD  Active   Multiple Vitamin  (MULTIVITAMIN) tablet [id number]  Take 1 tablet by mouth daily. [provider]  Active   OZEMPIC, 1 MG/DOSE, 4 MG/3ML SOPN [id number]  INJECT 1MG  SUBCUTANEOUSLY  ONCE A WEEK Arnette Felts, FNP  Active   pantoprazole (PROTONIX) 20 MG tablet [id number] No TAKE 1 TABLET BY MOUTH EVERY DAY  Patient not taking: Reported on 10/01/2022   Dorothyann Peng, MD Not Taking Active   triamcinolone cream (KENALOG) 0.1 % [id number] No APPLY TO AFFECTED AREA TWICE DAILY AS NEEDED  Patient not taking: Reported on 10/01/2022   Dorothyann Peng, MD Not Taking Active             SDOH:  (Social Determinants of Health) assessments and interventions performed: Yes SDOH Interventions    Flowsheet Row Care Coordination from 10/03/2022 in [facility] Clinical Support from 07/06/2022 in [facility] Office Visit from 06/17/2021 in [facility]  SDOH Interventions     Food Insecurity Interventions -- Intervention Not Indicated Intervention Not Indicated  Housing Interventions -- -- Intervention Not Indicated  Transportation Interventions -- Intervention Not Indicated --  Financial Strain Interventions --  [Patient assistance for Farxiga, tresiba and Ozempic] Intervention Not Indicated Intervention Not Indicated  Physical Activity Interventions -- Intervention Not Indicated --  [Plans to increase time spent exercising]  Stress Interventions --  [discussed iwth PCP] Intervention Not Indicated --       Medication Assistance:  Loura Pardon obtained through [employer], [place]  medication assistance program.  Enrollment ends 04/2023  Medication Access: Within the past 30 days, how often has patient missed a dose of medication? No Is a pillbox or other method used to improve adherence? Yes  Factors that may affect medication adherence? financial need and lack of understanding of disease management Are meds synced by current pharmacy? No   Are meds delivered by current pharmacy? No  Does patient experience delays in picking up medications due to transportation concerns? No   [facility] Reviewed: Is patient disadvantaged to use [facility]?: No  Current Rx insurance plan: Medicare Advantage  Name and location of Current pharmacy:  [id number] [place], [place] - [address] AT [facility] [address] [place] [address] Phone: [phone] Fax: [phone]  [facility] - [address] [place], [place] - [address] [place] [address] Phone: [phone] Fax: [phone]  [facility] - [place], [place] - [address] [facility] AT Portal to Registered [facility] Sites [address] [place] [address] Phone: [phone] Fax: [phone]   Compliance/Adherence/Medication fill history: Care Gaps: Annual wellness visit in last year? Yes Covid vaccine overdue Foot exam overdue   If Diabetic: Last eye exam / retinopathy screening: 02-07-2022 Last diabetic foot exam: None   Star-Rating Drugs: Atorvastatin 80 mg- Last [facility]22 [facility] Previous 04-14-2022 [facility] DS Ozempic 1 mg- Last filled 09-29-2022 [facility] Previous 09-06-2022 28 DS.  Farxiga 10 mg- PAP Lisinopril/HCTZ 20-25 mg- Last filled [facility]-28-2022 [facility] Previous 04-07-2022 90 DS. Tried to confirm last fill but was unable to hold longer than 10 minutes   Assessment/Plan   Diabetes (A1c goal <7%) -Uncontrolled -Current medications: Farxiga 10 mg tablet once per day Appropriate, Effective, Safe, Accessible Tresiba 200 unit/ml - inject 38 units per day  Appropriate, Effective, Safe, Accessible  Ozempic 1 mg - inject 1 mg once per week Appropriate, Effective, Safe, Accessible -Current home glucose readings: she does not have her meter with her so she does not have the readings  fasting glucose: 66 - light  headed - placed in [place]   -Reports hypoglycemic/hyperglycemic symptoms -Current meal patterns:  breakfast: grits, with butter and scrambled egg   lunch: kale with boiled chicken   dinner: green beans, baked chicken, and macaroni cheese - Sunday  snacks: no snacks through the day or dessert drinks: water, and apple juice- with water in it, she also drinks filtered water at work, she is drinking at least 4 glasses of water at home, and 4 glasses at work, in 8 ounce glasses -Current exercise: She spends thirty minutes per day exercising, she also using her three pound weights  -Educated on Complications of diabetes including kidney damage, retinal damage, and cardiovascular disease; Prevention and management of hypoglycemic episodes; Benefits of routine self-monitoring of blood sugar; Continuous glucose monitoring; -Counseled to check feet daily and get yearly eye exams -Counseled on diet and exercise extensively Recommended to continue current medication  Cherylin Mylar, CPP, PharmD Clinical Pharmacist Practitioner [facility] [phone]

## 2022-10-09 ENCOUNTER — Telehealth: Payer: Self-pay

## 2022-10-09 NOTE — Progress Notes (Signed)
Care Management & Coordination Services Pharmacy Team  Reason for Encounter: Diabetes  Contacted patient to discuss diabetes disease state. Unsuccessful outreach. Left voicemail for patient to return call.   Adherence Review: Is the patient currently on a STATIN medication? Yes Is the patient currently on ACE/ARB medication? Yes Does the patient have >5 day gap between last estimated fill dates? No   Chart Updates: Recent office visits:  None  Recent consult visits:  None  Hospital visits:  Medication Reconciliation was completed by comparing discharge summary, patient's EMR and Pharmacy list, and upon discussion with patient.  Admitted to the hospital on 10-01-2022 due to cellulitis of eyelid. Discharge date was 10-01-2022. Discharged from [facility] health urgent care  New?Medications Started at [facility] Discharge:?? Keflex 250 mg 3 times daily Patanol place 1 drop into both eyes daily  Medication Changes at Hospital Discharge: None  Medications Discontinued at Hospital Discharge: None  Medications that remain the same after Hospital Discharge:??  -All other medications will remain the same.    Medications: Outpatient Encounter Medications as of 10/09/2022  Medication Sig   aspirin 81 MG tablet Take 81 mg by mouth daily.   atorvastatin (LIPITOR) 80 MG tablet TAKE 1 TABLET BY MOUTH EVERY DAY   dapagliflozin propanediol (FARXIGA) 10 MG TABS tablet Take by mouth daily.   famotidine (PEPCID) 10 MG tablet Take 10 mg by mouth 2 (two) times daily.   fluticasone (FLONASE) 50 MCG/ACT nasal spray SPRAY 1 SPRAY INTO EACH NOSTRIL EVERY DAY   insulin degludec (TRESIBA FLEXTOUCH) 200 UNIT/ML FlexTouch Pen Inject 38 Units into the skin at bedtime.   Insulin Pen Needle (B-D UF III MINI PEN NEEDLES) 31G X 5 MM MISC USE AS DIRECTED WITH LEVEMIR   lisinopril-hydrochlorothiazide (ZESTORETIC) 20-25 MG tablet TAKE 1/2 TABLET BY MOUTH EVERY DAY   Multiple Vitamin (MULTIVITAMIN) tablet Take 1  tablet by mouth daily.   olopatadine (PATANOL) 0.1 % ophthalmic solution Place 1 drop into both eyes 2 (two) times daily.   OZEMPIC, 1 MG/DOSE, 4 MG/3ML SOPN INJECT 1MG  SUBCUTANEOUSLY  ONCE A WEEK   pantoprazole (PROTONIX) 20 MG tablet TAKE 1 TABLET BY MOUTH EVERY DAY (Patient not taking: Reported on 10/01/2022)   triamcinolone cream (KENALOG) 0.1 % APPLY TO AFFECTED AREA TWICE DAILY AS NEEDED (Patient not taking: Reported on 10/01/2022)   Facility-Administered Encounter Medications as of 10/09/2022  Medication   dextrose 5 % solution    Recent Relevant Labs: Lab Results  Component Value Date/Time   HGBA1C 8.9 (H) 09/14/2022 12:28 PM   HGBA1C 6.9 (H) 05/10/2022 04:44 PM   HGBA1C 7.7 01/16/2018 12:00 AM   MICROALBUR 30 11/10/2020 12:48 PM   MICROALBUR 30 11/04/2019 11:00 AM    Kidney Function Lab Results  Component Value Date/Time   CREATININE 0.91 09/14/2022 12:28 PM   CREATININE 0.95 05/10/2022 04:44 PM   GFRNONAA 75 06/09/2020 11:01 AM   GFRAA 86 06/09/2020 11:01 AM  10-09-2022: 1st attempt left VM 10-12-2022: 2nd attempt left VM 10-16-2022: 3rd attempt left VM  Star Rating Drugs:  Atorvastatin 80 mg- [facility] Previous 04-14-2022 90 DS Ozempic 1 mg- Last filled 09-29-2022 [facility] Previous 09-06-2022 28 DS.  Farxiga 10 mg- PAP Lisinopril/HCTZ 20-25 mg- Last filled 04-28-2022 [facility] Previous 04-07-2022 90 DS  Care Gaps: Annual wellness visit in last year? Yes Last eye exam / retinopathy screening:02-07-2022  Last diabetic foot exam: None   Huey Romans [facility] Clinical Pharmacist Assistant [phone]

## 2022-10-23 ENCOUNTER — Other Ambulatory Visit: Payer: Self-pay | Admitting: Internal Medicine

## 2022-10-27 ENCOUNTER — Telehealth: Payer: Self-pay

## 2022-10-27 NOTE — Progress Notes (Signed)
Care Management & Coordination Services Pharmacy Team  Reason for Encounter: Appointment Reminder  Contacted patient to confirm telephone appointment with Cherylin Mylar, PharmD on 10/31/2022 at 3:00 pm. Spoke with patient on 10/27/2022   Do you have any problems getting your medications? No If yes what types of problems are you experiencing?  N/a  What is your top health concern you would like to discuss at your upcoming visit? None- patient informed that her blood sugars have been doing really good, Fasting AM have been running around 106, 85, 86. She hasn't been able to check her blood sugars at night due to going from class to work. Patient continues with her exercise program and will have some new recipe's to share with [facility].   Have you seen any other providers since your last visit with PCP? No   Chart review:  Recent office visits:  10/03/2022- Cherylin Mylar, Pharm D (CCM)- No medication changes  Recent consult visits:  None  Hospital visits:  None in previous 6 months   Star Rating Drugs:  Medication:  Last Fill: Day Supply Atorvastatin 80 mg 10/24/2022, 07/24/2022 90 DS Ozempic 1 mg  09/29/2022, 09/06/2022 28 DS Lisinopril/HCTZ 20/25 mg     04/28/2022, 04/07/2022    90DS *CARE GAP Farxiga 10 mg- [place] patient assistance    Care Gaps: Annual wellness visit in last year? Yes- 07/06/2022, next scheduled 07/25/2023 Last Mammogram- 03/03/2022 Last DEXA- 07/08/2021 Last Colonoscopy- 02/07/2021 repeat in 3-5 years- Marsa Aris, MD ([facility]) COVID-19 Vaccine (6 - 2023-24 season)- Last completed: Jul 26, 2021  FOOT EXAM Edd Arbour)- Last completed: Jun 17, 2021   If Diabetic: Last eye exam / retinopathy screening: 9/12/[facility]- No Diabetes Retinopathy Last diabetic foot exam:  Billee Cashing, [facility] Clinical Pharmacist Assistant [phone]

## 2022-10-31 ENCOUNTER — Ambulatory Visit: Payer: Medicare Other

## 2022-10-31 NOTE — Progress Notes (Incomplete)
Care Management & Coordination Services Pharmacy Note  10/31/2022 Name:  Sylvia Kelly MRN:  [id number] DOB:  1956/02/04  Summary: She is going to use the PLATE method to prepare her meals. She is going to continue to take her medication at the same time each day.   Recommendations/Changes made from today's visit: Recommend Sylvia Kelly use the PLATE method for her eating habits.  Change patients current Lisinopril/HCTZ dose for ease of use.   Follow up plan: Collaborate with PCP to change patients current Lisinopril/HCTZ to 10-12.5 mg tablet once per day [facility].  Patient to start utilizing the PLATE method for her eating at least once per she is going to have a half plate of fresh vegetables.  Continue to focus on eating healthy and being consistent.  HC to send patient PLATE method handout, and complete quick patient outreach calls over the next two months to review BS readings and medication habits.    Subjective: Sylvia Kelly is an 66 y.o. year old female who is a primary patient of Sylvia Kelly.  The care coordination team was consulted for assistance with disease management and care coordination needs.    Engaged with patient by telephone for follow up visit.  Recent office visits:  10/03/2022- Sylvia Kelly, Pharm D (CCM)- No medication changes   Recent consult visits:  None   Hospital visits:  None in previous 6 months   Objective:  Lab Results  Component Value Date   CREATININE 0.91 09/14/2022   BUN 18 09/14/2022   EGFR 70 09/14/2022   GFRNONAA 75 06/09/2020   GFRAA 86 06/09/2020   NA 138 09/14/2022   K 4.1 09/14/2022   CALCIUM 9.3 09/14/2022   CO2 24 09/14/2022   GLUCOSE 186 (H) 09/14/2022    Lab Results  Component Value Date/Time   HGBA1C 8.9 (H) 09/14/2022 12:28 PM   HGBA1C 6.9 (H) 05/10/2022 04:44 PM   HGBA1C 7.7 01/16/2018 12:00 AM   MICROALBUR 30 11/10/2020 12:48 PM   MICROALBUR 30 11/04/2019 11:00 AM    Last  diabetic Eye exam:  Lab Results  Component Value Date/Time   HMDIABEYEEXA No Retinopathy 02/07/2022 12:00 AM    Last diabetic Foot exam: No results found for: "HMDIABFOOTEX"   Lab Results  Component Value Date   CHOL 168 02/01/2022   HDL 63 02/01/2022   LDLCALC 84 02/01/2022   TRIG 121 02/01/2022   CHOLHDL 2.7 02/01/2022       Latest Ref Rng & Units 09/14/2022   12:28 PM 02/01/2022    5:00 PM 06/17/2021    4:24 PM  Hepatic Function  Total Protein 6.0 - 8.5 g/dL 7.2  7.4  7.0   Albumin 3.9 - 4.9 g/dL 4.1  4.6  4.7   AST 0 - 40 IU/L 24  24  21    ALT 0 - 32 IU/L 18  23  19    Alk Phosphatase 44 - 121 IU/L 116  117  125   Total Bilirubin 0.0 - 1.2 mg/dL 0.3  0.3  0.2     Lab Results  Component Value Date/Time   TSH 0.60 10/10/2017 12:00 AM       Latest Ref Rng & Units 02/01/2022    5:00 PM 06/17/2021    4:24 PM 11/04/2019   10:08 AM  CBC  WBC 3.4 - 10.8 x10E3/uL 4.9  4.6  3.6   Hemoglobin 11.1 - 15.9 g/dL 40.9  81.1  91.4   Hematocrit 34.0 -  46.6 % 39.9  38.1  36.8   Platelets 150 - 450 x10E3/uL 312  305  285     Lab Results  Component Value Date/Time   VD25OH 28.1 (L) 09/14/2022 12:28 PM   VD25OH 20.6 (L) 06/17/2021 04:24 PM   VITAMINB12 509 06/17/2021 04:24 PM    Clinical ASCVD: No  The 10-year ASCVD risk score (Arnett DK, et al., 2019) is: 13%   Values used to calculate the score:     Age: 58 years     Sex: Female     Is Non-Hispanic African American: Yes     Diabetic: Yes     Tobacco smoker: No     Systolic Blood Pressure: 109 mmHg     Is BP treated: Yes     HDL Cholesterol: 63 mg/dL     Total Cholesterol: 168 mg/dL        06/03/1094    0:45 PM 09/14/2022   11:33 AM 07/06/2022    3:39 PM  Depression screen PHQ 2/9  Decreased Interest 0 0 0  Down, Depressed, Hopeless 0 0 0  PHQ - 2 Score 0 0 0  Altered sleeping  0   Tired, decreased energy  0   Change in appetite  0   Feeling bad or failure about yourself   0   Trouble concentrating  0   Moving slowly  or fidgety/restless  0   Suicidal thoughts  0   PHQ-9 Score  0   Difficult doing work/chores  Not difficult at all      Social History   Tobacco Use  Smoking Status Never  Smokeless Tobacco Never   BP Readings from Last 3 Encounters:  10/01/22 109/72  09/14/22 110/68  07/06/22 106/72   Pulse Readings from Last 3 Encounters:  10/01/22 82  09/14/22 84  07/06/22 60   Wt Readings from Last 3 Encounters:  09/14/22 145 lb 9.6 oz (66 kg)  07/06/22 147 lb (66.7 kg)  07/06/22 147 lb 3.2 oz (66.8 kg)   BMI Readings from Last 3 Encounters:  09/14/22 24.99 kg/m  07/06/22 25.23 kg/m  07/06/22 25.27 kg/m    No Known Allergies  Medications Reviewed Today     Reviewed by Sylvia Kelly, RPH (Pharmacist) on 10/03/22 at 1522  Med List Status: <None>   Medication Order Taking? Sig Documenting Provider Last Dose Status Informant  aspirin 81 MG tablet [id number] No Take 81 mg by mouth daily. Provider, Historical, Kelly Taking Active   atorvastatin (LIPITOR) 80 MG tablet [id number] No TAKE 1 TABLET BY MOUTH EVERY DAY Sylvia Kelly Taking Active   cephALEXin (KEFLEX) 250 MG capsule [id number]  Take 1 capsule (250 mg total) by mouth 3 (three) times daily for 7 days. Sylvia Kelly  Active   dapagliflozin propanediol (FARXIGA) 10 MG TABS tablet [id number] No Take by mouth daily. Provider, Historical, Kelly Taking Active   dextrose 5 % solution [id number]   Sylvia Kelly  Active   famotidine (PEPCID) 10 MG tablet [id number]  Take 10 mg by mouth 2 (two) times daily. Provider, Historical, Kelly  Active   fluticasone (FLONASE) 50 MCG/ACT nasal spray [id number] No SPRAY 1 SPRAY INTO EACH NOSTRIL EVERY DAY Raspet, Sylvia Kelly Taking Active   insulin degludec (TRESIBA FLEXTOUCH) 200 UNIT/ML FlexTouch Pen [id number] No Inject 38 Units into the skin at bedtime. Sylvia Kelly Taking Active   Insulin Pen Needle (B-D UF III MINI PEN NEEDLES) 31G X 5 MM  MISC [id number] No USE AS DIRECTED  WITH Sylvia Kelly Taking Active   lisinopril-hydrochlorothiazide (ZESTORETIC) 20-25 MG tablet [id number] No TAKE 1/2 TABLET BY MOUTH EVERY DAY Sylvia Kelly Taking Active   Multiple Vitamin (MULTIVITAMIN) tablet [id number] No Take 1 tablet by mouth daily. Provider, Historical, Kelly Taking Active   olopatadine (PATANOL) 0.1 % ophthalmic solution [id number]  Place 1 drop into both eyes 2 (two) times daily. Sylvia Kelly  Active   OZEMPIC, 1 MG/DOSE, 4 MG/3ML Namon Cirri [id number] No INJECT 1MG  SUBCUTANEOUSLY  ONCE A WEEK Sylvia Kelly Taking Active   pantoprazole (PROTONIX) 20 MG tablet [id number] No TAKE 1 TABLET BY MOUTH EVERY DAY  Patient not taking: Reported on 10/01/2022   Sylvia Kelly Not Taking Active   triamcinolone cream (KENALOG) 0.1 % [id number] No APPLY TO AFFECTED AREA TWICE DAILY AS NEEDED  Patient not taking: Reported on 10/01/2022   Sylvia Kelly Not Taking Active             SDOH:  (Social Determinants of Health) assessments and interventions performed: No SDOH Interventions    Flowsheet Row Care Coordination from 10/03/2022 in [facility] Clinical Support from 07/06/2022 in [facility] Office Visit from 06/17/2021 in [facility]  SDOH Interventions     Food Insecurity Interventions -- Intervention Not Indicated Intervention Not Indicated  Housing Interventions -- -- Intervention Not Indicated  Transportation Interventions -- Intervention Not Indicated --  Financial Strain Interventions --  [Patient assistance for Farxiga, tresiba and Ozempic] Intervention Not Indicated Intervention Not Indicated  Physical Activity Interventions -- Intervention Not Indicated --  [Plans to increase time spent exercising]  Stress Interventions --  [discussed iwth PCP] Intervention Not Indicated --       Medication Assistance: None required.  Patient affirms current coverage meets  needs.  Medication Access: Name and location of current pharmacy:  [id number] [place], [place] - [address] AT [facility] [address] [place] [address] Phone: [phone] Fax: [phone]  [facility] - [address] [place], [place] - [address] [place] [address] Phone: [phone] Fax: [phone]  [facility] [place], [place] - [facility] AT Portal to [facility] [address] [place] [address] Phone: [phone] Fax: [phone]  Within the past 30 days, how often has patient missed a dose of medication? None Is a pillbox or other method used to improve adherence? Yes  Factors that may affect medication adherence? nonadherence to medications and lack of understanding of disease management Are meds synced by current pharmacy? No  Are meds delivered by current pharmacy? Yes  Does patient experience delays in picking up medications due to transportation concerns? No   Compliance/Adherence/Medication fill history:  Care Gaps: Annual wellness visit in last year? Yes- 07/06/2022, next scheduled 07/25/2023 Last Mammogram- 03/03/2022 Last DEXA- 07/08/2021 Last Colonoscopy- 02/07/2021 repeat in 3-5 years- Marsa Aris, Kelly ([facility]) COVID-19 Vaccine (6 - 2023-24 season)- Last completed: Jul 26, 2021     FOOT EXAM Edd Arbour)- Last completed: Jun 17, 2021     If Diabetic: Last eye exam / retinopathy screening: 9/12/[facility]23[facility]- No Diabetes Retinopathy   Star-Rating Drugs: Medication:                Last Fill:  Day Supply Atorvastatin 80 mg      10/24/2022, 07/24/2022  90 DS Ozempic 1 mg             09/29/2022, 09/06/2022    28 DS Lisinopril/HCTZ 20/25 mg     04/28/2022, 04/07/2022    90DS *CARE GAP Farxiga 10 mg- [facility] patient assistance   Assessment/Plan   Diabetes (A1c goal  <7%) -Uncontrolled -Current medications: Ozempic 1 mg - inject 1 mg once per week Appropriate, Effective, Safe, Accessible Farxiga 10 mg tablet once per day Appropriate, Effective, Safe, Accessible Tresiba - inject 38 units at bedtime Appropriate, Effective, Safe, Accessible -Current home glucose readings fasting glucose: 94, 75, 106, 102   -Denies hypoglycemic/hyperglycemic symptoms -Current meal patterns:  lunch: boiled egg, wheat toast   dinner: split pea soup snacks: Oik[employer] yogurt - 15 grams of protein, blueberries   drinks: plenty of water  -Current exercise: will discuss during next visit  -Educated on Carbohydrate counting and/or plate method -She is limiting the amount of bread that she eats  -Counseled to check feet daily and get yearly eye exams -Counseled on diet and exercise extensively Recommended to continue current medication  Medication Adherence (Goal: close gap measure ACE) -Uncontrolled -Current treatment  Lisinopril/HCTZ 20-25 mg tablet once per day Appropriate, Effective, Safe, Query accessible Patient reports  having extra medication due to splitting pills in half.  -Collaborated with PCP changing patients current medication to Lisinopril/HCTZ 10/12.5 mg tablet once per day. Patient reports that she is going to start taking this new dose tomorrow, and pick up the medication from the pharmacy today.  -Educated on the importance of medication adherence for her health goals and maintenance.    Sylvia Kelly, CPP, PharmD Clinical Pharmacist Practitioner [facility] [phone]

## 2022-11-01 MED ORDER — LISINOPRIL-HYDROCHLOROTHIAZIDE 10-12.5 MG PO TABS: 1.0000 | ORAL_TABLET | Freq: Every day | ORAL | 3 refills | Status: DC

## 2022-11-08 ENCOUNTER — Telehealth: Payer: Self-pay

## 2022-11-08 NOTE — Progress Notes (Cosign Needed)
Faxed refill request form to [facility] for Farxiga 10 mg.    Tamala Melvin, CMA Clinical Pharmacist Assistant [phone]  

## 2022-12-28 ENCOUNTER — Encounter (HOSPITAL_COMMUNITY): Payer: Self-pay | Admitting: *Deleted

## 2022-12-28 ENCOUNTER — Ambulatory Visit (HOSPITAL_COMMUNITY)
Admission: EM | Admit: 2022-12-28 | Discharge: 2022-12-28 | Disposition: A | Discharge status: Home / Self Care | Payer: Medicare Other | Attending: Family Medicine | Admitting: Family Medicine

## 2022-12-28 DIAGNOSIS — U071 COVID-19: Secondary | ICD-10-CM | POA: Diagnosis not present

## 2022-12-28 DIAGNOSIS — J0140 Acute pansinusitis, unspecified: Secondary | ICD-10-CM | POA: Diagnosis not present

## 2022-12-28 DIAGNOSIS — R031 Nonspecific low blood-pressure reading: Secondary | ICD-10-CM | POA: Diagnosis not present

## 2022-12-28 DIAGNOSIS — J069 Acute upper respiratory infection, unspecified: Secondary | ICD-10-CM

## 2022-12-28 DIAGNOSIS — R42 Dizziness and giddiness: Secondary | ICD-10-CM | POA: Insufficient documentation

## 2022-12-28 LAB — POCT URINALYSIS DIP (MANUAL ENTRY)
Bilirubin, UA: NEGATIVE
Glucose, UA: 500 mg/dL — AB
Ketones, POC UA: NEGATIVE mg/dL
Leukocytes, UA: NEGATIVE
Nitrite, UA: NEGATIVE
Protein Ur, POC: NEGATIVE mg/dL
Spec Grav, UA: <=1.005 — AB (ref 1.010–1.025)
Urobilinogen, UA: 0.2 E.U./dL
pH, UA: 5 (ref 5.0–8.0)

## 2022-12-28 LAB — POCT FASTING CBG KUC MANUAL ENTRY: POCT Glucose (KUC): 146 mg/dL — AB (ref 70–99)

## 2022-12-28 MED ORDER — PROMETHAZINE-DM 6.25-15 MG/5ML PO SYRP: 5.0000 mL | ORAL_SOLUTION | Freq: Four times a day (QID) | ORAL | 0 refills | Status: DC | PRN

## 2022-12-28 MED ORDER — AZITHROMYCIN 250 MG PO TABS: ORAL_TABLET | ORAL | 0 refills | Status: DC

## 2022-12-28 NOTE — ED Provider Notes (Signed)
[facility]    CSN: [id number] Arrival date & time: 12/28/22  0908      History   Chief Complaint Chief Complaint  Patient presents with   Cough   Nasal Congestion   Headache   Dizziness    HPI Sylvia Kelly is a 67 y.o. female.   HPI Patient presents with URI symptoms including cough, nasal congestion, and sinus pressure. Unknown of COVID exposure.  Patient endorses dizziness since onset of symptoms.Denies worrisome symptoms of shortness of breath, weakness, N&V, or chest pain.Symptoms present for one week. Patient is a diabetic and has a diagnosis of  hyperlipidemia which is a risk factor for complicated COVID. She has taken  Mucinex for symptoms without significant  improvement. Past Medical History:  Diagnosis Date   Allergy    Arthritis    hip   Atrophic vaginitis    Diabetes mellitus    Elevated cholesterol    Hypertension    Neuromuscular disorder (HCC)    neuropathy hands and feet    Patient Active Problem List   Diagnosis Date Noted   Skin lesion 09/14/2022   Borborygmus 09/14/2022   Constipation 09/14/2022   Vitamin D insufficiency 06/24/2021   Body mass index (BMI) of 25.0 to 25.9 in adult 06/24/2021   Pseudopolyposis of colon without complication, unspecified part of colon (HCC) 06/17/2021   Multinodular goiter 11/12/2020   Paresthesia and pain of extremity 11/12/2020   Female climacteric state 01/04/2017   Other long term (current) drug therapy 01/04/2017   Essential (primary) hypertension    Type 2 diabetes mellitus with diabetic neuropathy, unspecified (HCC)    Elevated cholesterol     Past Surgical History:  Procedure Laterality Date   COLONOSCOPY     10 yrs ago- [employer] - normal per pt   OVARIAN CYST SURGERY  05/29/1990   REFRACTIVE SURGERY     TUBAL LIGATION      OB History     Gravida  2   Para  1   Term  1   Preterm      AB  1   Living  1      SAB      IAB      Ectopic      Multiple      Live  Births               Home Medications    Prior to Admission medications   Medication Sig Start Date End Date Taking? Authorizing Provider  aspirin 81 MG tablet Take 81 mg by mouth daily.   Yes [provider]  atorvastatin (LIPITOR) 80 MG tablet TAKE 1 TABLET BY MOUTH EVERY DAY 10/24/22  Yes Dorothyann Peng, MD  azithromycin (ZITHROMAX) 250 MG tablet Take 2 tabs PO x 1 dose, then 1 tab PO QD x 4 days 12/28/22  Yes Bing Neighbors, NP  dapagliflozin propanediol (FARXIGA) 10 MG TABS tablet Take by mouth daily.   Yes [provider]  insulin degludec (TRESIBA FLEXTOUCH) 200 UNIT/ML FlexTouch Pen Inject 38 Units into the skin at bedtime. 06/21/22  Yes Arnette Felts, FNP  Insulin Pen Needle (B-D UF III MINI PEN NEEDLES) 31G X 5 MM MISC USE AS DIRECTED WITH LEVEMIR 02/17/22  Yes Dorothyann Peng, MD  lisinopril-hydrochlorothiazide (ZESTORETIC) 10-12.5 MG tablet Take 1 tablet by mouth daily. 11/01/22  Yes Dorothyann Peng, MD  Multiple Vitamin (MULTIVITAMIN) tablet Take 1 tablet by mouth daily.   Yes [provider]  olopatadine (PATANOL) 0.1 % ophthalmic solution Place 1 drop into both eyes 2 (two) times daily. 10/01/22  Yes Banister, Janace Aris, MD  OZEMPIC, 1 MG/DOSE, 4 MG/3ML SOPN INJECT 1MG  SUBCUTANEOUSLY  ONCE A WEEK 09/06/22  Yes Arnette Felts, FNP  promethazine-dextromethorphan (PROMETHAZINE-DM) 6.25-15 MG/5ML syrup Take 5 mLs by mouth 4 (four) times daily as needed for cough. 12/28/22  Yes Bing Neighbors, NP  famotidine (PEPCID) 10 MG tablet Take 10 mg by mouth 2 (two) times daily.    [provider]  fluticasone (FLONASE) 50 MCG/ACT nasal spray SPRAY 1 SPRAY INTO EACH NOSTRIL EVERY DAY 12/05/21   Raspet, Erin K, PA-C  pantoprazole (PROTONIX) 20 MG tablet TAKE 1 TABLET BY MOUTH EVERY DAY Patient not taking: Reported on 10/01/2022 03/16/22   Dorothyann Peng, MD  triamcinolone cream (KENALOG) 0.1 % APPLY TO AFFECTED AREA TWICE DAILY AS NEEDED 02/15/22   Dorothyann Peng,  MD    Family History Family History  Problem Relation Age of Onset   Heart disease Mother    Diabetes Mother    Cancer Father        Lung cancer   Colon cancer Neg Hx    Colon polyps Neg Hx    Esophageal cancer Neg Hx    Rectal cancer Neg Hx    Stomach cancer Neg Hx     Social History Social History   Tobacco Use   Smoking status: Never   Smokeless tobacco: Never  Vaping Use   Vaping status: Never Used  Substance Use Topics   Alcohol use: No   Drug use: No     Allergies   Patient has no known allergies.   Review of Systems Review of Systems Pertinent negatives listed in HPI   Physical Exam Triage Vital Signs ED Triage Vitals  Encounter Vitals Group     BP 12/28/22 0945 96/64     Systolic BP Percentile --      Diastolic BP Percentile --      Pulse Rate 12/28/22 0945 (!) 107     Resp 12/28/22 0945 20     Temp 12/28/22 0945 98.7 F (37.1 C)     Temp Source 12/28/22 0945 Oral     SpO2 12/28/22 0945 98 %     Weight --      Height --      Head Circumference --      Peak Flow --      Pain Score 12/28/22 0943 0     Pain Loc --      Pain Education --      Exclude from Growth Chart --    No data found.  Updated Vital Signs BP 96/64 (BP Location: Left Arm)   Pulse (!) 107   Temp 98.7 F (37.1 C) (Oral)   Resp 20   SpO2 98%   Visual Acuity Right Eye Distance:   Left Eye Distance:   Bilateral Distance:    Right Eye Near:   Left Eye Near:    Bilateral Near:     Physical Exam Vitals reviewed.  Constitutional:      Appearance: She is well-developed. She is ill-appearing.  HENT:     Head: Normocephalic and atraumatic.     Right Ear: Hearing, tympanic membrane, ear canal and external ear normal.     Left Ear: Hearing, tympanic membrane, ear canal and external ear normal.     Nose: Mucosal edema, congestion and rhinorrhea present.     Right Sinus: Maxillary sinus  tenderness and frontal sinus tenderness present.     Left Sinus: Maxillary sinus  tenderness and frontal sinus tenderness present.     Mouth/Throat:     Mouth: Mucous membranes are moist.  Eyes:     Extraocular Movements: Extraocular movements intact.     Pupils: Pupils are equal, round, and reactive to light.  Cardiovascular:     Rate and Rhythm: Regular rhythm. Tachycardia present.  Pulmonary:     Effort: Pulmonary effort is normal.     Breath sounds: Normal breath sounds.  Musculoskeletal:        General: Normal range of motion.     Cervical back: Normal range of motion and neck supple.  Lymphadenopathy:     Cervical: Cervical adenopathy present.  Skin:    General: Skin is warm and dry.  Neurological:     General: No focal deficit present.     Mental Status: She is alert.      UC Treatments / Results  Labs (all labs ordered are listed, but only abnormal results are displayed) Labs Reviewed  SARS CORONAVIRUS 2 (TAT 6-24 HRS) - Abnormal; Notable for the following components:      Result Value   SARS Coronavirus 2 POSITIVE (*)    All other components within normal limits  POCT FASTING CBG [facility] MANUAL ENTRY - Abnormal; Notable for the following components:   POCT Glucose ([facility]) 146 (*)    All other components within normal limits  POCT URINALYSIS DIP (MANUAL ENTRY) - Abnormal; Notable for the following components:   Glucose, UA =500 (*)    Spec Grav, UA <=1.005 (*)    Blood, UA moderate (*)    All other components within normal limits    EKG   Radiology No results found.  Procedures Procedures (including critical care time)  Medications Ordered in UC Medications - No data to display  Initial Impression / Assessment and Plan / UC Course  I have reviewed the triage vital signs and the nursing notes.  Pertinent labs & imaging results that were available during my care of the patient were reviewed by me and considered in my medical decision making (see chart for details).    Screen for COVID given symptoms. Encouraged to hydrate well with  fluids. Discussed with patient low blood pressure reading. BP rechecked and BP remained low. Advised patient to follow-up with primary care to discuss BP medications and low BP. Treating for acute sinusitis per discharge medications order. If symptoms worsen or do not improve return for evaluation. Final Clinical Impressions(s) / UC Diagnoses   Final diagnoses:  Dizziness and giddiness  Low blood pressure reading  Acute non-recurrent pansinusitis     Discharge Instructions      Talk with your primary care provider on your next appointment regarding lower your blood pressure has been dropping while taking lisinopril and HCTZ.  Suspect this is the reason why you have been so dizzy.  Your COVID test is pending.  Your results will be available tomorrow.  Drink well with fluids.  Our office will only call you if your results are positive.  You can view your test results via MyChart.     ED Prescriptions     Medication Sig Dispense Auth. Provider   azithromycin (ZITHROMAX) 250 MG tablet Take 2 tabs PO x 1 dose, then 1 tab PO QD x 4 days 6 tablet Bing Neighbors, NP   promethazine-dextromethorphan (PROMETHAZINE-DM) 6.25-15 MG/5ML syrup Take 5 mLs by mouth 4 (four) times  daily as needed for cough. 180 mL Bing Neighbors, NP      PDMP not reviewed this encounter.   Bing Neighbors, NP 12/31/22 [phone]

## 2022-12-28 NOTE — ED Triage Notes (Signed)
Pt states for the past week she has had cough, congestion, headache, dizziness. She has been taking mucinex.

## 2022-12-28 NOTE — Discharge Instructions (Signed)
Talk with your primary care provider on your next appointment regarding lower your blood pressure has been dropping while taking lisinopril and HCTZ.  Suspect this is the reason why you have been so dizzy.  Your COVID test is pending.  Your results will be available tomorrow.  Drink well with fluids.  Our office will only call you if your results are positive.  You can view your test results via MyChart.

## 2023-01-04 ENCOUNTER — Ambulatory Visit: Payer: Self-pay | Admitting: Internal Medicine

## 2023-01-14 ENCOUNTER — Other Ambulatory Visit: Payer: Self-pay

## 2023-01-14 ENCOUNTER — Ambulatory Visit (HOSPITAL_COMMUNITY)
Admission: EM | Admit: 2023-01-14 | Discharge: 2023-01-14 | Disposition: A | Discharge status: Home / Self Care | Payer: Medicare Other | Attending: Emergency Medicine | Admitting: Emergency Medicine

## 2023-01-14 ENCOUNTER — Encounter (HOSPITAL_COMMUNITY): Payer: Self-pay | Admitting: *Deleted

## 2023-01-14 DIAGNOSIS — H5789 Other specified disorders of eye and adnexa: Secondary | ICD-10-CM | POA: Diagnosis not present

## 2023-01-14 MED ORDER — ERYTHROMYCIN 5 MG/GM OP OINT: TOPICAL_OINTMENT | OPHTHALMIC | 0 refills | Status: DC

## 2023-01-14 NOTE — Discharge Instructions (Addendum)
Please go home and remove your mascara.  Please do not use this mascara until your eye has improved.  Please start the erythromycin ointment to help cover against bacterial pathogens.  Seek immediate care if you develop sudden worsening eye pain, vision loss, or any new concerning symptoms.

## 2023-01-14 NOTE — ED Triage Notes (Signed)
Pt reports Rt eye pain with no injury.

## 2023-01-14 NOTE — ED Provider Notes (Signed)
[facility]    CSN: [id number] Arrival date & time: 01/14/23  1504      History   Chief Complaint Chief Complaint  Patient presents with   Eye Pain    HPI Sylvia Kelly is a 67 y.o. female.   Patient presents to clinic for right eye pain and discomfort. She recently started using a new mascara and felt like some got into her eye causing irritation. She has been itching her right eye.  Denies vision changes or eye drainage. She wears glasses, does not wear contacts.     The history is provided by the patient and medical records.  Eye Pain    Past Medical History:  Diagnosis Date   Allergy    Arthritis    hip   Atrophic vaginitis    Diabetes mellitus    Elevated cholesterol    Hypertension    Neuromuscular disorder (HCC)    neuropathy hands and feet    Patient Active Problem List   Diagnosis Date Noted   Skin lesion 09/14/2022   Borborygmus 09/14/2022   Constipation 09/14/2022   Vitamin D insufficiency 06/24/2021   Body mass index (BMI) of 25.0 to 25.9 in adult 06/24/2021   Pseudopolyposis of colon without complication, unspecified part of colon (HCC) 06/17/2021   Multinodular goiter 11/12/2020   Paresthesia and pain of extremity 11/12/2020   Female climacteric state 01/04/2017   Other long term (current) drug therapy 01/04/2017   Essential (primary) hypertension    Type 2 diabetes mellitus with diabetic neuropathy, unspecified (HCC)    Elevated cholesterol     Past Surgical History:  Procedure Laterality Date   COLONOSCOPY     10 yrs ago- [employer] - normal per pt   OVARIAN CYST SURGERY  05/29/1990   REFRACTIVE SURGERY     TUBAL LIGATION      OB History     Gravida  2   Para  1   Term  1   Preterm      AB  1   Living  1      SAB      IAB      Ectopic      Multiple      Live Births               Home Medications    Prior to Admission medications   Medication Sig Start Date End Date Taking?  Authorizing Provider  aspirin 81 MG tablet Take 81 mg by mouth daily.   Yes [provider]  atorvastatin (LIPITOR) 80 MG tablet TAKE 1 TABLET BY MOUTH EVERY DAY 10/24/22  Yes Dorothyann Peng, MD  dapagliflozin propanediol (FARXIGA) 10 MG TABS tablet Take by mouth daily.   Yes [provider]  erythromycin ophthalmic ointment Place a 1/2 inch ribbon of ointment into the lower eyelid 4x daily x 5 days. 01/14/23  Yes Rinaldo Ratel, [place] N, FNP  famotidine (PEPCID) 10 MG tablet Take 10 mg by mouth 2 (two) times daily.   Yes [provider]  fluticasone (FLONASE) 50 MCG/ACT nasal spray SPRAY 1 SPRAY INTO EACH NOSTRIL EVERY DAY 12/05/21  Yes Raspet, Erin K, PA-C  insulin degludec (TRESIBA FLEXTOUCH) 200 UNIT/ML FlexTouch Pen Inject 38 Units into the skin at bedtime. 06/21/22  Yes Arnette Felts, FNP  Insulin Pen Needle (B-D UF III MINI PEN NEEDLES) 31G X 5 MM MISC USE AS DIRECTED WITH LEVEMIR 02/17/22  Yes Dorothyann Peng, MD  lisinopril-hydrochlorothiazide (ZESTORETIC) 10-12.5 MG  tablet Take 1 tablet by mouth daily. 11/01/22  Yes Dorothyann Peng, MD  Multiple Vitamin (MULTIVITAMIN) tablet Take 1 tablet by mouth daily.   Yes [provider]  olopatadine (PATANOL) 0.1 % ophthalmic solution Place 1 drop into both eyes 2 (two) times daily. 10/01/22  Yes Banister, Janace Aris, MD  OZEMPIC, 1 MG/DOSE, 4 MG/3ML SOPN INJECT 1MG  SUBCUTANEOUSLY  ONCE A WEEK 09/06/22  Yes Arnette Felts, FNP  promethazine-dextromethorphan (PROMETHAZINE-DM) 6.25-15 MG/5ML syrup Take 5 mLs by mouth 4 (four) times daily as needed for cough. 12/28/22  Yes Bing Neighbors, NP  azithromycin (ZITHROMAX) 250 MG tablet Take 2 tabs PO x 1 dose, then 1 tab PO QD x 4 days 12/28/22   Bing Neighbors, NP  pantoprazole (PROTONIX) 20 MG tablet TAKE 1 TABLET BY MOUTH EVERY DAY Patient not taking: Reported on 10/01/2022 03/16/22   Dorothyann Peng, MD  triamcinolone cream (KENALOG) 0.1 % APPLY TO AFFECTED AREA TWICE DAILY AS NEEDED  02/15/22   Dorothyann Peng, MD    Family History Family History  Problem Relation Age of Onset   Heart disease Mother    Diabetes Mother    Cancer Father        Lung cancer   Colon cancer Neg Hx    Colon polyps Neg Hx    Esophageal cancer Neg Hx    Rectal cancer Neg Hx    Stomach cancer Neg Hx     Social History Social History   Tobacco Use   Smoking status: Never   Smokeless tobacco: Never  Vaping Use   Vaping status: Never Used  Substance Use Topics   Alcohol use: No   Drug use: No     Allergies   Patient has no known allergies.   Review of Systems Review of Systems  Eyes:  Positive for pain and itching. Negative for photophobia, discharge, redness and visual disturbance.     Physical Exam Triage Vital Signs ED Triage Vitals  Encounter Vitals Group     BP 01/14/23 1604 99/67     Systolic BP Percentile --      Diastolic BP Percentile --      Pulse Rate 01/14/23 1604 84     Resp 01/14/23 1604 16     Temp 01/14/23 1604 98.3 F (36.8 C)     Temp src --      SpO2 01/14/23 1604 95 %     Weight --      Height --      Head Circumference --      Peak Flow --      Pain Score 01/14/23 1601 4     Pain Loc --      Pain Education --      Exclude from Growth Chart --    No data found.  Updated Vital Signs BP 99/67   Pulse 84   Temp 98.3 F (36.8 C)   Resp 16   SpO2 95%   Visual Acuity Right Eye Distance:   Left Eye Distance:   Bilateral Distance:    Right Eye Near:   Left Eye Near:    Bilateral Near:     Physical Exam Vitals and nursing note reviewed.  Constitutional:      Appearance: Normal appearance.  HENT:     Head: Normocephalic and atraumatic.     Right Ear: External ear normal.     Left Ear: External ear normal.     Nose: Nose normal.  Mouth/Throat:     Mouth: Mucous membranes are moist.  Eyes:     General: Lids are normal. Lids are everted, no foreign bodies appreciated. Vision grossly intact. Gaze aligned appropriately.         Right eye: No discharge.        Left eye: No discharge.     Extraocular Movements: Extraocular movements intact.     Conjunctiva/sclera:     Right eye: Right conjunctiva is injected.     Pupils: Pupils are equal, round, and reactive to light.     Comments: Slight conjunctival irritation of right eye. No drainage or discharge noted. PERRLA.   Cardiovascular:     Rate and Rhythm: Normal rate.  Pulmonary:     Effort: Pulmonary effort is normal. No respiratory distress.  Musculoskeletal:        General: Normal range of motion.  Skin:    General: Skin is warm and dry.  Neurological:     General: No focal deficit present.     Mental Status: She is alert and oriented to person, place, and time.  Psychiatric:        Mood and Affect: Mood normal.        Behavior: Behavior normal. Behavior is cooperative.      UC Treatments / Results  Labs (all labs ordered are listed, but only abnormal results are displayed) Labs Reviewed - No data to display  EKG   Radiology No results found.  Procedures Procedures (including critical care time)  Medications Ordered in UC Medications - No data to display  Initial Impression / Assessment and Plan / UC Course  I have reviewed the triage vital signs and the nursing notes.  Pertinent labs & imaging results that were available during my care of the patient were reviewed by me and considered in my medical decision making (see chart for details).  Vitals and triage reviewed, patient is hemodynamically stable. PERRLA.  Slight erythema to right conjunctiva.  Vision grossly intact. Since itching and irritation happened a few days prior, do not feel as if a foreign body would remain at this time.  No obvious foreign body on exam.  Will prevent against bacterial infection with erythromycin ointment.  POC, f/u care and return precautions given, no questions at this time.      Final Clinical Impressions(s) / UC Diagnoses   Final diagnoses:  Eye  irritation     Discharge Instructions      Please go home and remove your mascara.  Please do not use this mascara until your eye has improved.  Please start the erythromycin ointment to help cover against bacterial pathogens.  Seek immediate care if you develop sudden worsening eye pain, vision loss, or any new concerning symptoms.     ED Prescriptions     Medication Sig Dispense Auth. Provider   erythromycin ophthalmic ointment Place a 1/2 inch ribbon of ointment into the lower eyelid 4x daily x 5 days. 3.5 g Tajana Crotteau, [place] N, FNP      PDMP not reviewed this encounter.   Victorya Hillman, [place] N, [place] 01/14/23 [id number]

## 2023-01-17 NOTE — Patient Instructions (Signed)

## 2023-01-17 NOTE — Progress Notes (Signed)
I,[name], CMA,acting as a Neurosurgeon for [name], MD.,have documented all relevant documentation on the behalf of [name], MD,as directed by  [name], MD while in the presence of [name], MD.  Subjective:  Patient ID: Sylvia Kelly , female    DOB: 03-21-1956 , 67 y.o.   MRN: [id number]  Chief Complaint  Patient presents with   Diabetes   Hypertension   Hyperlipidemia    HPI  She presents today for DM, HTN & cholesterol follow-up.  She reports compliance with meds.  She denies headaches, chest pain and shortness of breath.   She reports testing positive for COVID on August 2nd. She states she isolated and got a lot of rest. Sh eis now feeling better.  She has upcoming trip to [place], she is looking forward to the relaxation.    She asks, how long should she wait to get covid booster. Also if it is recommended to get RSV immunization.      Diabetes She presents for her follow-up diabetic visit. She has type 2 diabetes mellitus. Her disease course has been improving. There are no hypoglycemic associated symptoms. Pertinent negatives for diabetes include no blurred vision, no chest pain, no fatigue, no polydipsia, no polyphagia and no polyuria. There are no hypoglycemic complications. Diabetic complications include peripheral neuropathy. Risk factors for coronary artery disease include diabetes mellitus, dyslipidemia, sedentary lifestyle, hypertension and post-menopausal.  Hypertension This is a chronic problem. The current episode started more than 1 year ago. The problem has been gradually improving since onset. Pertinent negatives include no blurred vision, chest pain, palpitations or shortness of breath.  Hyperlipidemia This is a chronic problem. The current episode started more than 1 year ago. Exacerbating diseases include diabetes. Pertinent negatives include no chest pain or shortness of breath. Current antihyperlipidemic treatment  includes statins. The current treatment provides moderate improvement of lipids. There are no compliance problems.  Risk factors for coronary artery disease include post-menopausal, hypertension, diabetes mellitus and dyslipidemia.     Past Medical History:  Diagnosis Date   Allergy    Arthritis    hip   Atrophic vaginitis    Diabetes mellitus    Elevated cholesterol    Hypertension    Neuromuscular disorder (HCC)    neuropathy hands and feet     Family History  Problem Relation Age of Onset   Heart disease Mother    Diabetes Mother    Cancer Father        Lung cancer   Colon cancer Neg Hx    Colon polyps Neg Hx    Esophageal cancer Neg Hx    Rectal cancer Neg Hx    Stomach cancer Neg Hx      Current Outpatient Medications:    aspirin 81 MG tablet, Take 81 mg by mouth daily., Disp: , Rfl:    atorvastatin (LIPITOR) 80 MG tablet, TAKE 1 TABLET BY MOUTH EVERY DAY, Disp: 90 tablet, Rfl: 1   azithromycin (ZITHROMAX) 250 MG tablet, Take 2 tabs PO x 1 dose, then 1 tab PO QD x 4 days, Disp: 6 tablet, Rfl: 0   dapagliflozin propanediol (FARXIGA) 10 MG TABS tablet, Take by mouth daily., Disp: , Rfl:    erythromycin ophthalmic ointment, Place a 1/2 inch ribbon of ointment into the lower eyelid 4x daily x 5 days., Disp: 3.5 g, Rfl: 0   famotidine (PEPCID) 10 MG tablet, Take 10 mg by mouth 2 (two) times daily., Disp: ,  Rfl:    fluticasone (FLONASE) 50 MCG/ACT nasal spray, SPRAY 1 SPRAY INTO EACH NOSTRIL EVERY DAY, Disp: 16 g, Rfl: 0   insulin degludec (TRESIBA FLEXTOUCH) 200 UNIT/ML FlexTouch Pen, Inject 38 Units into the skin at bedtime., Disp: 27 mL, Rfl: 2   Insulin Pen Needle (B-D UF III MINI PEN NEEDLES) 31G X 5 MM MISC, USE AS DIRECTED WITH LEVEMIR, Disp: 150 each, Rfl: 3   lisinopril-hydrochlorothiazide (ZESTORETIC) 10-12.5 MG tablet, Take 1 tablet by mouth daily., Disp: 90 tablet, Rfl: 3   Multiple Vitamin (MULTIVITAMIN) tablet, Take 1 tablet by mouth daily., Disp: , Rfl:     OZEMPIC, 1 MG/DOSE, 4 MG/3ML SOPN, INJECT 1MG  SUBCUTANEOUSLY  ONCE A WEEK, Disp: 3 mL, Rfl: 2   triamcinolone cream (KENALOG) 0.1 %, APPLY TO AFFECTED AREA TWICE DAILY AS NEEDED, Disp: 30 g, Rfl: 1   olopatadine (PATANOL) 0.1 % ophthalmic solution, Place 1 drop into both eyes 2 (two) times daily. (Patient not taking: Reported on 01/18/2023), Disp: 5 mL, Rfl: 0   pantoprazole (PROTONIX) 20 MG tablet, TAKE 1 TABLET BY MOUTH EVERY DAY (Patient not taking: Reported on 10/01/2022), Disp: 90 tablet, Rfl: 1   promethazine-dextromethorphan (PROMETHAZINE-DM) 6.25-15 MG/5ML syrup, Take 5 mLs by mouth 4 (four) times daily as needed for cough. (Patient not taking: Reported on 01/18/2023), Disp: 180 mL, Rfl: 0  Current Facility-Administered Medications:    dextrose 5 % solution, , Intravenous, Continuous, Nandigam, Kavitha V, MD   No Known Allergies   Review of Systems  Constitutional: Negative.  Negative for fatigue.  HENT: Negative.    Eyes: Negative.  Negative for blurred vision.  Respiratory: Negative.  Negative for shortness of breath.   Cardiovascular: Negative.  Negative for chest pain and palpitations.  Endocrine: Negative for polydipsia, polyphagia and polyuria.  Neurological: Negative.   Psychiatric/Behavioral: Negative.       Today's Vitals   01/18/23 0827  BP: 115/64  Pulse: 92  Temp: 98.5 F (36.9 C)  SpO2: 98%  Weight: 139 lb 6.4 oz (63.2 kg)  Height: 5\' 4"  (1.626 m)   Body mass index is 23.93 kg/m.  Wt Readings from Last 3 Encounters:  01/18/23 139 lb 6.4 oz (63.2 kg)  09/14/22 145 lb 9.6 oz (66 kg)  07/06/22 147 lb (66.7 kg)     Objective:  Physical Exam Vitals and nursing note reviewed.  Constitutional:      Appearance: Normal appearance.  HENT:     Head: Normocephalic and atraumatic.  Eyes:     Extraocular Movements: Extraocular movements intact.  Cardiovascular:     Rate and Rhythm: Normal rate and regular rhythm.     Pulses:          Dorsalis pedis pulses are 2+  on the right side and 2+ on the left side.     Heart sounds: Normal heart sounds.  Pulmonary:     Effort: Pulmonary effort is normal.     Breath sounds: Normal breath sounds.  Musculoskeletal:     Cervical back: Normal range of motion.  Feet:     Right foot:     Protective Sensation: 5 sites tested.  5 sites sensed.     Skin integrity: Dry skin present.     Toenail Condition: Right toenails are normal.     Left foot:     Protective Sensation: 5 sites tested.  5 sites sensed.     Skin integrity: Dry skin present.     Toenail Condition: Left toenails are normal.  Skin:    General: Skin is warm.  Neurological:     General: No focal deficit present.     Mental Status: She is alert.  Psychiatric:        Mood and Affect: Mood normal.        Behavior: Behavior normal.         Assessment And Plan:  Type 2 diabetes mellitus with diabetic neuropathy, with long-term current use of insulin (HCC) Assessment & Plan: Chronic, importance of dietary compliance was discussed with the patient. She will continue with semaglutide 1mg  weekly and Tresiba 38 units nightly. She will f/u in 3-4 months for re-evaluation.   Orders: -     CBC -     CMP14+EGFR -     Lipid panel -     Hemoglobin A1c -     Amb Referral To Provider Referral Exercise Program (P.R.E.P) -     TSH -     AMB Referral to Pharmacy Medication Management  Essential (primary) hypertension Assessment & Plan: Chronic, well controlled.  She will continue with lisinopril/hct 10/12.5mg  daily. She is encouraged to follow low sodium diet.   Orders: -     Amb Referral To Provider Referral Exercise Program (P.R.E.P) -     AMB Referral to Pharmacy Medication Management  Pure hypercholesterolemia Assessment & Plan: Chronic, LDL goal is less than 70.  She will continue with atorvastatin daily. She is encouraged to follow heart healthy lifestyle.   Orders: -     Lipid panel -     Amb Referral To Provider Referral Exercise Program  (P.R.E.P) -     TSH -     AMB Referral to Pharmacy Medication Management  Postviral fatigue syndrome Assessment & Plan: She was given vitamin B12 IM x 1.   Orders: -     Cyanocobalamin  Drug therapy  Personal history of COVID-19     Return in 3 months (on 04/20/2023), or dm check, for Add RS visit(DM) to upcoming AWV visit.  Patient was given opportunity to ask questions. Patient verbalized understanding of the plan and was able to repeat key elements of the plan. All questions were answered to their satisfaction.   I, [name], MD, have reviewed all documentation for this visit. The documentation on 01/18/23 for the exam, diagnosis, procedures, and orders are all accurate and complete.   IF YOU HAVE BEEN REFERRED TO A SPECIALIST, IT MAY TAKE 1-2 WEEKS TO SCHEDULE/PROCESS THE REFERRAL. IF YOU HAVE NOT HEARD FROM US/SPECIALIST IN TWO WEEKS, PLEASE GIVE [place] A CALL AT [phone] X [phone].   THE PATIENT IS ENCOURAGED TO PRACTICE SOCIAL DISTANCING DUE TO THE COVID-19 PANDEMIC.

## 2023-01-18 ENCOUNTER — Encounter: Payer: Self-pay | Admitting: Internal Medicine

## 2023-01-18 ENCOUNTER — Ambulatory Visit (INDEPENDENT_AMBULATORY_CARE_PROVIDER_SITE_OTHER): Payer: Medicare Other | Admitting: Internal Medicine

## 2023-01-18 VITALS — BP 115/64 | HR 92 | Temp 98.5°F | Ht 64.0 in | Wt 139.4 lb

## 2023-01-18 DIAGNOSIS — E1140 Type 2 diabetes mellitus with diabetic neuropathy, unspecified: Secondary | ICD-10-CM | POA: Diagnosis not present

## 2023-01-18 DIAGNOSIS — Z79899 Other long term (current) drug therapy: Secondary | ICD-10-CM

## 2023-01-18 DIAGNOSIS — I10 Essential (primary) hypertension: Secondary | ICD-10-CM | POA: Diagnosis not present

## 2023-01-18 DIAGNOSIS — E559 Vitamin D deficiency, unspecified: Secondary | ICD-10-CM

## 2023-01-18 DIAGNOSIS — G9331 Postviral fatigue syndrome: Secondary | ICD-10-CM

## 2023-01-18 DIAGNOSIS — E7800 Pure hypercholesterolemia, unspecified: Secondary | ICD-10-CM | POA: Diagnosis not present

## 2023-01-18 DIAGNOSIS — Z8616 Personal history of COVID-19: Secondary | ICD-10-CM

## 2023-01-18 DIAGNOSIS — R198 Other specified symptoms and signs involving the digestive system and abdomen: Secondary | ICD-10-CM

## 2023-01-18 DIAGNOSIS — L989 Disorder of the skin and subcutaneous tissue, unspecified: Secondary | ICD-10-CM

## 2023-01-18 DIAGNOSIS — Z794 Long term (current) use of insulin: Secondary | ICD-10-CM

## 2023-01-18 MED ORDER — CYANOCOBALAMIN 1000 MCG/ML IJ SOLN
1000.0000 ug | Freq: Once | INTRAMUSCULAR | Status: AC
Start: 2023-01-18 — End: 2023-01-18
  Administered 2023-01-18: 1000 ug via INTRAMUSCULAR

## 2023-01-19 ENCOUNTER — Telehealth: Payer: Self-pay

## 2023-01-19 NOTE — Telephone Encounter (Signed)
VMT pt requesting call back to discuss starting PREP

## 2023-01-21 LAB — CBC
Hematocrit: 33.9 % — ABNORMAL LOW (ref 34.0–46.6)
Hemoglobin: 10.5 g/dL — ABNORMAL LOW (ref 11.1–15.9)
MCH: 27 pg (ref 26.6–33.0)
MCHC: 31 g/dL — ABNORMAL LOW (ref 31.5–35.7)
MCV: 87 fL (ref 79–97)
Platelets: 404 10*3/uL (ref 150–450)
RBC: 3.89 x10E6/uL (ref 3.77–5.28)
RDW: 14.6 % (ref 11.7–15.4)
WBC: 6.7 10*3/uL (ref 3.4–10.8)

## 2023-01-21 LAB — LIPID PANEL
Chol/HDL Ratio: 3.3 ratio (ref 0.0–4.4)
Cholesterol, Total: 174 mg/dL (ref 100–199)
HDL: 52 mg/dL (ref >39)
LDL Chol Calc (NIH): 105 mg/dL — ABNORMAL HIGH (ref 0–99)
Triglycerides: 92 mg/dL (ref 0–149)
VLDL Cholesterol Cal: 17 mg/dL (ref 5–40)

## 2023-01-21 LAB — CMP14+EGFR
ALT: 11 IU/L (ref 0–32)
AST: 15 IU/L (ref 0–40)
Albumin: 3.9 g/dL (ref 3.9–4.9)
Alkaline Phosphatase: 121 IU/L (ref 44–121)
BUN/Creatinine Ratio: 21 (ref 12–28)
BUN: 19 mg/dL (ref 8–27)
Bilirubin Total: 0.4 mg/dL (ref 0.0–1.2)
CO2: 27 mmol/L (ref 20–29)
Calcium: 9 mg/dL (ref 8.7–10.3)
Chloride: 98 mmol/L (ref 96–106)
Creatinine, Ser: 0.89 mg/dL (ref 0.57–1.00)
Globulin, Total: 3.5 g/dL (ref 1.5–4.5)
Glucose: 132 mg/dL — ABNORMAL HIGH (ref 70–99)
Potassium: 4.1 mmol/L (ref 3.5–5.2)
Sodium: 138 mmol/L (ref 134–144)
Total Protein: 7.4 g/dL (ref 6.0–8.5)
eGFR: 71 mL/min/{1.73_m2} (ref >59)

## 2023-01-21 LAB — TSH: TSH: 2.79 u[IU]/mL (ref 0.450–4.500)

## 2023-01-21 LAB — HEMOGLOBIN A1C
Est. average glucose Bld gHb Est-mCnc: 183 mg/dL
Hgb A1c MFr Bld: 8 % — ABNORMAL HIGH (ref 4.8–5.6)

## 2023-01-22 ENCOUNTER — Encounter: Payer: Self-pay | Admitting: Internal Medicine

## 2023-01-25 DIAGNOSIS — G9331 Postviral fatigue syndrome: Secondary | ICD-10-CM | POA: Insufficient documentation

## 2023-01-25 NOTE — Assessment & Plan Note (Signed)
Chronic, well controlled.  She will continue with lisinopril/hct 10/12.5mg  daily. She is encouraged to follow low sodium diet.

## 2023-01-25 NOTE — Assessment & Plan Note (Signed)
She was given vitamin B12 IM x 1.

## 2023-01-25 NOTE — Assessment & Plan Note (Addendum)
Chronic, importance of dietary compliance was discussed with the patient. She will continue with semaglutide 1mg  weekly and Tresiba 38 units nightly. She will f/u in 3-4 months for re-evaluation.

## 2023-01-25 NOTE — Assessment & Plan Note (Signed)
Chronic, LDL goal is less than 70.  She will continue with atorvastatin daily. She is encouraged to follow heart healthy lifestyle.

## 2023-01-26 ENCOUNTER — Other Ambulatory Visit: Payer: Self-pay | Admitting: Internal Medicine

## 2023-01-26 DIAGNOSIS — Z1231 Encounter for screening mammogram for malignant neoplasm of breast: Secondary | ICD-10-CM

## 2023-01-30 ENCOUNTER — Other Ambulatory Visit (HOSPITAL_COMMUNITY): Payer: Self-pay

## 2023-01-30 ENCOUNTER — Telehealth: Payer: Self-pay

## 2023-01-30 NOTE — Telephone Encounter (Signed)
[facility] pt requesting call back reference more info on PREP class start date.   Call to pt. MW 230p-345p time will not work but a MW [phone] starting in NOV will.   Explained will call her when I have the start dates worked out. Pt agreeable.

## 2023-02-01 ENCOUNTER — Telehealth: Payer: Self-pay

## 2023-02-01 NOTE — Progress Notes (Signed)
   Care Guide Note  02/01/2023 Name: Sylvia Kelly MRN: [id number] DOB: [birth year]  Referred by: Dorothyann Peng, MD Reason for referral : Care Coordination (Outreach to schedule with pharm d)   Sylvia Kelly is a 67 y.o. year old female who is a primary care patient of Dorothyann Peng, MD. Sylvia Kelly was referred to the pharmacist for assistance related to DM.    Successful contact was made with the patient to discuss pharmacy services including being ready for the pharmacist to call at least 5 minutes before the scheduled appointment time, to have medication bottles and any blood sugar or blood pressure readings ready for review. The patient agreed to meet with the pharmacist via with the pharmacist via telephone visit on (date/time).  03/09/2023   Penne Lash, RMA Care Guide [facility]  [place], [place] [address] Direct Dial: [phone] [email or web address]

## 2023-02-02 ENCOUNTER — Other Ambulatory Visit (HOSPITAL_COMMUNITY): Payer: Self-pay

## 2023-02-12 ENCOUNTER — Other Ambulatory Visit: Payer: Self-pay | Admitting: Nurse Practitioner

## 2023-02-14 ENCOUNTER — Other Ambulatory Visit (INDEPENDENT_AMBULATORY_CARE_PROVIDER_SITE_OTHER): Payer: Medicare Other

## 2023-02-14 DIAGNOSIS — D649 Anemia, unspecified: Secondary | ICD-10-CM | POA: Diagnosis not present

## 2023-02-14 LAB — HEMOCCULT GUIAC POC 1CARD (OFFICE)
Card #2 Fecal Occult Blod, POC: NEGATIVE
Card #3 Date: 9072024
Card #3 Fecal Occult Blood, POC: NEGATIVE
Fecal Occult Blood, POC: NEGATIVE

## 2023-02-16 ENCOUNTER — Telehealth: Payer: Self-pay

## 2023-02-16 NOTE — Telephone Encounter (Signed)
Patient aware. Patient assistance ready for pick up.

## 2023-02-28 ENCOUNTER — Encounter: Payer: Self-pay | Admitting: Pharmacist

## 2023-02-28 ENCOUNTER — Encounter: Payer: Self-pay | Admitting: Internal Medicine

## 2023-02-28 ENCOUNTER — Other Ambulatory Visit: Payer: Self-pay

## 2023-02-28 ENCOUNTER — Ambulatory Visit (INDEPENDENT_AMBULATORY_CARE_PROVIDER_SITE_OTHER): Payer: Medicare Other | Admitting: Internal Medicine

## 2023-02-28 VITALS — BP 120/78 | HR 69 | Temp 98.1°F | Ht 64.0 in | Wt 141.8 lb

## 2023-02-28 DIAGNOSIS — W19XXXA Unspecified fall, initial encounter: Secondary | ICD-10-CM | POA: Diagnosis not present

## 2023-02-28 DIAGNOSIS — Z794 Long term (current) use of insulin: Secondary | ICD-10-CM

## 2023-02-28 DIAGNOSIS — M5489 Other dorsalgia: Secondary | ICD-10-CM

## 2023-02-28 DIAGNOSIS — K5140 Inflammatory polyps of colon without complications: Secondary | ICD-10-CM

## 2023-02-28 DIAGNOSIS — E1140 Type 2 diabetes mellitus with diabetic neuropathy, unspecified: Secondary | ICD-10-CM | POA: Diagnosis not present

## 2023-02-28 DIAGNOSIS — Z23 Encounter for immunization: Secondary | ICD-10-CM

## 2023-02-28 DIAGNOSIS — S20419A Abrasion of unspecified back wall of thorax, initial encounter: Secondary | ICD-10-CM

## 2023-02-28 LAB — HEMOGLOBIN A1C
Est. average glucose Bld gHb Est-mCnc: 166 mg/dL
Hgb A1c MFr Bld: 7.4 % — ABNORMAL HIGH (ref 4.8–5.6)

## 2023-02-28 NOTE — Progress Notes (Unsigned)
I,Ouida Abeyta T Deloria Lair, CMA,acting as a Neurosurgeon for [place], CMA.,have documented all relevant documentation on the behalf of [place] Breeze, [place] directed by  Coolidge Breeze, CMA while in the presence of [place] Breeze, [place].  Subjective:  Patient ID: Sylvia Kelly , female    DOB: Jun 25, 1955 , 67 y.o.   MRN: [id number]  No chief complaint on file.   HPI  HPI   Past Medical History:  Diagnosis Date   Allergy    Arthritis    hip   Atrophic vaginitis    Diabetes mellitus    Elevated cholesterol    Hypertension    Neuromuscular disorder (HCC)    neuropathy hands and feet     Family History  Problem Relation Age of Onset   Heart disease Mother    Diabetes Mother    Cancer Father        Lung cancer   Colon cancer Neg Hx    Colon polyps Neg Hx    Esophageal cancer Neg Hx    Rectal cancer Neg Hx    Stomach cancer Neg Hx      Current Outpatient Medications:    aspirin 81 MG tablet, Take 81 mg by mouth daily., Disp: , Rfl:    atorvastatin (LIPITOR) 80 MG tablet, TAKE 1 TABLET BY MOUTH EVERY DAY, Disp: 90 tablet, Rfl: 1   azithromycin (ZITHROMAX) 250 MG tablet, Take 2 tabs PO x 1 dose, then 1 tab PO QD x 4 days, Disp: 6 tablet, Rfl: 0   dapagliflozin propanediol (FARXIGA) 10 MG TABS tablet, Take by mouth daily., Disp: , Rfl:    erythromycin ophthalmic ointment, Place a 1/2 inch ribbon of ointment into the lower eyelid 4x daily x 5 days., Disp: 3.5 g, Rfl: 0   famotidine (PEPCID) 10 MG tablet, Take 10 mg by mouth 2 (two) times daily., Disp: , Rfl:    fluticasone (FLONASE) 50 MCG/ACT nasal spray, SPRAY 1 SPRAY INTO EACH NOSTRIL EVERY DAY, Disp: 16 g, Rfl: 0   insulin degludec (TRESIBA FLEXTOUCH) 200 UNIT/ML FlexTouch Pen, Inject 38 Units into the skin at bedtime., Disp: 27 mL, Rfl: 2   Insulin Pen Needle (B-D UF III MINI PEN NEEDLES) 31G X 5 MM MISC, USE AS DIRECTED WITH LEVEMIR, Disp: 150 each, Rfl: 3   lisinopril-hydrochlorothiazide (ZESTORETIC)  10-12.5 MG tablet, Take 1 tablet by mouth daily., Disp: 90 tablet, Rfl: 3   Multiple Vitamin (MULTIVITAMIN) tablet, Take 1 tablet by mouth daily., Disp: , Rfl:    olopatadine (PATANOL) 0.1 % ophthalmic solution, Place 1 drop into both eyes 2 (two) times daily. (Patient not taking: Reported on 01/18/2023), Disp: 5 mL, Rfl: 0   OZEMPIC, 1 MG/DOSE, 4 MG/3ML SOPN, INJECT 1MG  SUBCUTANEOUSLY  ONCE A WEEK, Disp: 3 mL, Rfl: 2   pantoprazole (PROTONIX) 20 MG tablet, TAKE 1 TABLET BY MOUTH EVERY DAY (Patient not taking: Reported on 10/01/2022), Disp: 90 tablet, Rfl: 1   promethazine-dextromethorphan (PROMETHAZINE-DM) 6.25-15 MG/5ML syrup, Take 5 mLs by mouth 4 (four) times daily as needed for cough. (Patient not taking: Reported on 01/18/2023), Disp: 180 mL, Rfl: 0   triamcinolone cream (KENALOG) 0.1 %, APPLY TO AFFECTED AREA TWICE DAILY AS NEEDED, Disp: 30 g, Rfl: 1  Current Facility-Administered Medications:    dextrose 5 % solution, , Intravenous, Continuous, Nandigam, Kavitha V, MD   No Known Allergies   Review of Systems   There were no vitals filed for this visit. There is no height or  weight on file to calculate BMI.  Wt Readings from Last 3 Encounters:  02/28/23 141 lb 12.8 oz (64.3 kg)  01/18/23 139 lb 6.4 oz (63.2 kg)  09/14/22 145 lb 9.6 oz (66 kg)     Objective:  Physical Exam      Assessment And Plan:  There are no diagnoses linked to this encounter.   No follow-ups on file.  Patient was given opportunity to ask questions. Patient verbalized understanding of the plan and was able to repeat key elements of the plan. All questions were answered to their satisfaction.  [place] Breeze, CMA  I, [place] Bree[place], CMA, have reviewed all documentation for this visit. The documentation on 02/28/23 for the exam, diagnosis, procedures, and orders are all accurate and complete.   IF YOU HAVE BEEN REFERRED TO A SPECIALIST, IT MAY TAKE 1-2 WEEKS TO SCHEDULE/PROCESS THE REFERRAL. IF YOU  HAVE NOT HEARD FROM US/SPECIALIST IN TWO WEEKS, PLEASE GIVE [place] A CALL AT [phone] X [phone].   THE PATIENT IS ENCOURAGED TO PRACTICE SOCIAL DISTANCING DUE TO THE COVID-19 PANDEMIC.

## 2023-02-28 NOTE — Progress Notes (Signed)
I,[name], CMA,acting as a Neurosurgeon for Sylvia Aliment, MD.,have documented all relevant documentation on the behalf of Sylvia Aliment, MD,as directed by  Sylvia Aliment, MD while in the presence of Sylvia Aliment, MD.  Subjective:  Patient ID: Sylvia Kelly , female    DOB: 1955-10-28 , 67 y.o.   MRN: [id number]  Chief Complaint  Patient presents with   Fall   Mid Back Pain    HPI  Patient presents today for evaluation after a fall on Monday 02/26/23. She states she was stepping up on her step stool to get into the bed, and she fell sideways and hit her back on the knob of her nightstand.   She reports having DM eye exam scheduled for 10/29.       Past Medical History:  Diagnosis Date   Allergy    Arthritis    hip   Atrophic vaginitis    Diabetes mellitus    Elevated cholesterol    Hypertension    Neuromuscular disorder (HCC)    neuropathy hands and feet     Family History  Problem Relation Age of Onset   Heart disease Mother    Diabetes Mother    Cancer Father        Lung cancer   Colon cancer Neg Hx    Colon polyps Neg Hx    Esophageal cancer Neg Hx    Rectal cancer Neg Hx    Stomach cancer Neg Hx      Current Outpatient Medications:    aspirin 81 MG tablet, Take 81 mg by mouth daily., Disp: , Rfl:    atorvastatin (LIPITOR) 80 MG tablet, TAKE 1 TABLET BY MOUTH EVERY DAY, Disp: 90 tablet, Rfl: 1   dapagliflozin propanediol (FARXIGA) 10 MG TABS tablet, Take by mouth daily., Disp: , Rfl:    famotidine (PEPCID) 10 MG tablet, Take 10 mg by mouth 2 (two) times daily., Disp: , Rfl:    fluticasone (FLONASE) 50 MCG/ACT nasal spray, SPRAY 1 SPRAY INTO EACH NOSTRIL EVERY DAY (Patient not taking: Reported on 03/05/2023), Disp: 16 g, Rfl: 0   Insulin Pen Needle (B-D UF III MINI PEN NEEDLES) 31G X 5 MM MISC, USE AS DIRECTED WITH LEVEMIR, Disp: 150 each, Rfl: 3   lisinopril-hydrochlorothiazide (ZESTORETIC) 10-12.5 MG tablet, Take 1 tablet by mouth daily., Disp:  90 tablet, Rfl: 3   Multiple Vitamin (MULTIVITAMIN) tablet, Take 1 tablet by mouth daily., Disp: , Rfl:    OZEMPIC, 1 MG/DOSE, 4 MG/3ML SOPN, INJECT 1MG  SUBCUTANEOUSLY  ONCE A WEEK, Disp: 3 mL, Rfl: 2   triamcinolone cream (KENALOG) 0.1 %, APPLY TO AFFECTED AREA TWICE DAILY AS NEEDED, Disp: 30 g, Rfl: 1   Cholecalciferol (VITAMIN D3) 50 MCG (2000 UT) capsule, Take 2,000 Units by mouth daily., Disp: , Rfl:    ezetimibe (ZETIA) 10 MG tablet, Take 1 tablet (10 mg total) by mouth daily., Disp: 90 tablet, Rfl: 3   insulin degludec (TRESIBA FLEXTOUCH) 200 UNIT/ML FlexTouch Pen, Inject 32 Units into the skin at bedtime., Disp: , Rfl:   Current Facility-Administered Medications:    dextrose 5 % solution, , Intravenous, Continuous, Nandigam, Kavitha V, MD   No Known Allergies   Review of Systems  Constitutional: Negative.   Respiratory: Negative.    Cardiovascular: Negative.   Gastrointestinal: Negative.   Musculoskeletal:  Positive for back pain.  Neurological: Negative.   Psychiatric/Behavioral: Negative.       Today's Vitals   02/28/23 1000  BP: 120/78  Pulse: 69  Temp: 98.1 F (36.7 C)  SpO2: 98%  Weight: 141 lb 12.8 oz (64.3 kg)  Height: 5\' 4"  (1.626 m)   Body mass index is 24.34 kg/m.  Wt Readings from Last 3 Encounters:  02/28/23 141 lb 12.8 oz (64.3 kg)  01/18/23 139 lb 6.4 oz (63.2 kg)  09/14/22 145 lb 9.6 oz (66 kg)     Objective:  Physical Exam Vitals and nursing note reviewed.  Constitutional:      Appearance: Normal appearance.  HENT:     Head: Normocephalic and atraumatic.  Eyes:     Extraocular Movements: Extraocular movements intact.  Cardiovascular:     Rate and Rhythm: Normal rate and regular rhythm.     Heart sounds: Normal heart sounds.  Pulmonary:     Effort: Pulmonary effort is normal.     Breath sounds: Normal breath sounds.  Musculoskeletal:     Cervical back: Normal range of motion.  Skin:    General: Skin is warm.     Comments: Abrasion on  midback  Neurological:     General: No focal deficit present.     Mental Status: She is alert.  Psychiatric:        Mood and Affect: Mood normal.        Behavior: Behavior normal.         Assessment And Plan:  Midline back pain, unspecified back location, unspecified chronicity Assessment & Plan: This is result of her fall. She will take Tylenol prn.    Fall, initial encounter Assessment & Plan: Occurred on 02/26/23. She is advised to plant her feet in the middle of her stepstool on not on the edge.    Abrasion of back, initial encounter Assessment & Plan: I applied Bacitracin to affected area. She was advised to have a family member apply to her back 1-2 times daily until fully healed.    Type 2 diabetes mellitus with diabetic neuropathy, with long-term current use of insulin (HCC) -     Hemoglobin A1c  Pseudopolyposis of colon without complication, unspecified part of colon [facility]) Assessment & Plan: She is due for repeat colonoscopy in 2027, last one done in 2022.       Return if symptoms worsen or fail to improve.  Patient was given opportunity to ask questions. Patient verbalized understanding of the plan and was able to repeat key elements of the plan. All questions were answered to their satisfaction.    I, Sylvia Aliment, MD, have reviewed all documentation for this visit. The documentation on 02/28/23 for the exam, diagnosis, procedures, and orders are all accurate and complete.   IF YOU HAVE BEEN REFERRED TO A SPECIALIST, IT MAY TAKE 1-2 WEEKS TO SCHEDULE/PROCESS THE REFERRAL. IF YOU HAVE NOT HEARD FROM US/SPECIALIST IN TWO WEEKS, PLEASE GIVE [place] A CALL AT [phone].   THE PATIENT IS ENCOURAGED TO PRACTICE SOCIAL DISTANCING DUE TO THE COVID-19 PANDEMIC.

## 2023-02-28 NOTE — Patient Instructions (Signed)
Fall Prevention in the Home, Adult Falls can cause injuries and can happen to people of all ages. There are many things you can do to make your home safer and to help prevent falls. What actions can I take to prevent falls? General information Use good lighting in all rooms. Make sure to: Replace any light bulbs that burn out. Turn on the lights in dark areas and use night-lights. Keep items that you use often in easy-to-reach places. Lower the shelves around your home if needed. Move furniture so that there are clear paths around it. Do not use throw rugs or other things on the floor that can make you trip. If any of your floors are uneven, fix them. Add color or contrast paint or tape to clearly mark and help you see: Grab bars or handrails. First and last steps of staircases. Where the edge of each step is. If you use a ladder or stepladder: Make sure that it is fully opened. Do not climb a closed ladder. Make sure the sides of the ladder are locked in place. Have someone hold the ladder while you use it. Know where your pets are as you move through your home. What can I do in the bathroom?     Keep the floor dry. Clean up any water on the floor right away. Remove soap buildup in the bathtub or shower. Buildup makes bathtubs and showers slippery. Use non-skid mats or decals on the floor of the bathtub or shower. Attach bath mats securely with double-sided, non-slip rug tape. If you need to sit down in the shower, use a non-slip stool. Install grab bars by the toilet and in the bathtub and shower. Do not use towel bars as grab bars. What can I do in the bedroom? Make sure that you have a light by your bed that is easy to reach. Do not use any sheets or blankets on your bed that hang to the floor. Have a firm chair or bench with side arms that you can use for support when you get dressed. What can I do in the kitchen? Clean up any spills right away. If you need to reach something  above you, use a step stool with a grab bar. Keep electrical cords out of the way. Do not use floor polish or wax that makes floors slippery. What can I do with my stairs? Do not leave anything on the stairs. Make sure that you have a light switch at the top and the bottom of the stairs. Make sure that there are handrails on both sides of the stairs. Fix handrails that are broken or loose. Install non-slip stair treads on all your stairs if they do not have carpet. Avoid having throw rugs at the top or bottom of the stairs. Choose a carpet that does not hide the edge of the steps on the stairs. Make sure that the carpet is firmly attached to the stairs. Fix carpet that is loose or worn. What can I do on the outside of my home? Use bright outdoor lighting. Fix the edges of walkways and driveways and fix any cracks. Clear paths of anything that can make you trip, such as tools or rocks. Add color or contrast paint or tape to clearly mark and help you see anything that might make you trip as you walk through a door, such as a raised step or threshold. Trim any bushes or trees on paths to your home. Check to see if handrails are loose   or broken and that both sides of all steps have handrails. Install guardrails along the edges of any raised decks and porches. Have leaves, snow, or ice cleared regularly. Use sand, salt, or ice melter on paths if you live where there is ice and snow during the winter. Clean up any spills in your garage right away. This includes grease or oil spills. What other actions can I take? Review your medicines with your doctor. Some medicines can cause dizziness or changes in blood pressure, which increase your risk of falling. Wear shoes that: Have a low heel. Do not wear high heels. Have rubber bottoms and are closed at the toe. Feel good on your feet and fit well. Use tools that help you move around if needed. These include: Canes. Walkers. Scooters. Crutches. Ask  your doctor what else you can do to help prevent falls. This may include seeing a physical therapist to learn to do exercises to move better and get stronger. Where to find more information Centers for Disease Control and Prevention, [employer]: cdc.gov National Institute on Aging: nia.nih.gov National Institute on Aging: nia.nih.gov Contact a doctor if: You are afraid of falling at home. You feel weak, drowsy, or dizzy at home. You fall at home. Get help right away if you: Lose consciousness or have trouble moving after a fall. Have a fall that causes a head injury. These symptoms may be an emergency. Get help right away. Call 911. Do not wait to see if the symptoms will go away. Do not drive yourself to the hospital. This information is not intended to replace advice given to you by your health care provider. Make sure you discuss any questions you have with your health care provider. Document Revised: 01/16/2022 Document Reviewed: 01/16/2022 Elsevier Patient Education  2024 Elsevier Inc.  

## 2023-03-03 ENCOUNTER — Encounter: Payer: Self-pay | Admitting: Internal Medicine

## 2023-03-05 ENCOUNTER — Other Ambulatory Visit (INDEPENDENT_AMBULATORY_CARE_PROVIDER_SITE_OTHER): Payer: Medicare Other | Admitting: Pharmacist

## 2023-03-05 DIAGNOSIS — Z794 Long term (current) use of insulin: Secondary | ICD-10-CM

## 2023-03-05 MED ORDER — TRESIBA FLEXTOUCH 200 UNIT/ML ~~LOC~~ SOPN: 32.0000 [IU] | PEN_INJECTOR | Freq: Every day | SUBCUTANEOUS | Status: DC

## 2023-03-05 MED ORDER — EZETIMIBE 10 MG PO TABS: 10.0000 mg | ORAL_TABLET | Freq: Every day | ORAL | 3 refills | Status: DC

## 2023-03-05 NOTE — Progress Notes (Signed)
03/05/2023 Name: Sylvia Kelly MRN: [id number] DOB: 1955/06/02  Chief Complaint  Patient presents with   Hypertension   Medication Management   Diabetes    CHARIKA MIKELSON is a 67 y.o. year old female who presented for a telephone visit.   They were referred to the pharmacist by their PCP for assistance in managing diabetes, hypertension, and hyperlipidemia.    Subjective:  Care Team: Primary Care Provider: Dorothyann Peng, MD ; Next Scheduled Visit: 07/24/22  Medication Access/Adherence  Current Pharmacy:  [id number] - [place], [place] - [address] AT [facility] [address] [place] [address] Phone: [phone] Fax: [phone]  [facility] - [address] [place], [place] - [address] [place] [address] Phone: [phone] Fax: [phone]  [facility] - [place], [place] - [facility] AT Portal to Reg[facility] [address] [place] [address] Phone: [phone] Fax: [phone]   Patient reports affordability concerns with their medications: No  Patient reports access/transportation concerns to their pharmacy: No  Patient reports adherence concerns with their medications:  No     Diabetes:  Current medications: Farxiga 10 mg daily, Tresiba 38 units, Ozempic 1 mg weekly Medications tried in the past: metformin - unclear reason for discontinuation   Current glucose readings: fasting: 65 this morning; reports fastings in 70-80s generally, sometimes checks in the afternoons  Patient denies hypoglycemic s/sx including dizziness, shakiness, sweating. Patient denies hyperglycemic symptoms including polyuria, polydipsia, polyphagia, nocturia, neuropathy, blurred vision.  Current meal patterns:  - Breakfast: sometimes protein shake; sometimes oatmeal; sometimes boiled egg - Lunch: sandwiches; protein shakes   - Supper: reports this is usually a smaller meal; yesterday had chicken pasta and cornbread (small servings) - Drinks: water; loves apple juice   Current physical activity: Planning on starting the [employer] program - 12 weeks; starting in November   Current medication access support: Farxiga through patient assistance, note of approval for [employer] in the Media chart   Hypertension:  Current medications: lisinopril/hydrochlorothiazide 10/12.5 mg daily  Patient has a validated, automated, upper arm home BP cuff  Patient denies hypotensive s/sx including dizziness, lightheadedness.  Patient denies hypertensive symptoms including headache, chest pain, shortness of breath  Hyperlipidemia/ASCVD Risk Reduction  Current lipid lowering medications: atorvastatin 80 mg daily  Notes she was previously on ezetimibe, but was too expensive.   Antiplatelet regimen: aspirin 81 mg daily   Objective:  Lab Results  Component Value Date   HGBA1C 7.4 (H) 02/28/2023    Lab Results  Component Value Date   CREATININE 0.89 01/18/2023   BUN 19 01/18/2023   NA 138 01/18/2023   K 4.1 01/18/2023   CL 98 01/18/2023   CO2 27 01/18/2023    Lab Results  Component Value Date   CHOL 174 01/18/2023   HDL 52 01/18/2023   LDLCALC 105 (H) 01/18/2023   TRIG 92 01/18/2023   CHOLHDL 3.3 01/18/2023    Medications Reviewed Today     Reviewed by Alden Hipp, RPH-CPP (Pharmacist) on 03/05/23 at 0908  Med List Status: <None>   Medication Order Taking? Sig Documenting Provider Last Dose Status Informant  aspirin 81 MG tablet [id number] Yes Take 81 mg by mouth daily. [provider] Taking Active   atorvastatin (LIPITOR) 80 MG tablet [id number] Yes TAKE 1 TABLET BY MOUTH EVERY DAY Dorothyann Peng, MD Taking Active   dapagliflozin  propanediol (FARXIGA) 10 MG TABS tablet [id number] Yes Take by mouth daily. [provider] Taking Active   dextrose 5 % solution [id number]   Napoleon Form, MD  Active   famotidine (PEPCID) 10 MG tablet [id number] Yes Take 10 mg by mouth 2 (two) times daily. [provider] Taking Active   fluticasone (FLONASE) 50 MCG/ACT nasal spray [id number] No SPRAY 1 SPRAY INTO EACH NOSTRIL EVERY DAY  Patient not taking: Reported on 03/05/2023   Raspet, Erin K, PA-C Not Taking Active   insulin degludec (TRESIBA FLEXTOUCH) 200 UNIT/ML FlexTouch Pen [id number] Yes Inject 38 Units into the skin at bedtime. Arnette Felts, FNP Taking Active   Insulin Pen Needle (B-D UF III MINI PEN NEEDLES) 31G X 5 MM MISC [id number] Yes USE AS DIRECTED WITH Corene Cornea, MD Taking Active   lisinopril-hydrochlorothiazide (ZESTORETIC) 10-12.5 MG tablet [id number] Yes Take 1 tablet by mouth daily. Dorothyann Peng, MD Taking Active   Multiple Vitamin (MULTIVITAMIN) tablet [id number] Yes Take 1 tablet by mouth daily. [provider] Taking Active   OZEMPIC, 1 MG/DOSE, 4 MG/3ML SOPN [id number] Yes INJECT 1MG  SUBCUTANEOUSLY  ONCE A WEEK Arnette Felts, FNP Taking Active   triamcinolone cream (KENALOG) 0.1 % [id number] Yes APPLY TO AFFECTED AREA TWICE DAILY AS NEEDED Dorothyann Peng, MD Taking Active               Assessment/Plan:   Diabetes: - Currently uncontrolled but improved  - Reviewed long term cardiovascular and renal outcomes of uncontrolled blood sugar - Reviewed goal A1c, goal fasting, and goal 2 hour post prandial glucose - Reviewed dietary modifications including: praised for focus on proteins, fibers.  - Reviewed lifestyle modifications including: praised for [employer] class - Recommend to reduce [place] to 32 units daily. Considered increasing Ozempic to 2 mg weekly, but patient denies desire for more weight loss at this time. Continue Farxiga 10 mg daily. Will collaborate with team for re-enrollment.  - Recommend to check glucose twice daily, fasting and 2 hour post prandial   Hypertension: - Currently controlled - Reviewed long term  cardiovascular and renal outcomes of uncontrolled blood pressure - Reviewed appropriate blood pressure monitoring technique and reviewed goal blood pressure. Recommended to check home blood pressure and heart rate periodically  - Recommend to continue current regimen at this time  Hyperlipidemia/ASCVD Risk Reduction: - Currently uncontrolled.  - Reviewed long term complications of uncontrolled cholesterol. Discussed goal LDL <70. Recommend addition of ezetimibe. Patient amenable.   Follow Up Plan: phone call in 6 weeks  Catie TClearance Coots, PharmD, BCACP, CPP Clinical Pharmacist [facility] [phone]

## 2023-03-05 NOTE — Patient Instructions (Signed)
Julio,   It was great talking to you today!  Decrease Tresiba to 32 units daily. Continue Ozempic 1 mg weekly, Farxiga 10 mg daily.   Check your blood sugars twice daily:  1) Fasting, first thing in the morning before breakfast and  2) 2 hours after your largest meal.   For a goal A1c of less than 7%, goal fasting readings are less than 130 and goal 2 hour after meal readings are less than 180.    Check your blood pressure periodically, and any time you have concerning symptoms like headache, chest pain, dizziness, shortness of breath, or vision changes.   Our goal is less than 130/80.  To appropriately check your blood pressure, make sure you do the following:  1) Avoid caffeine, exercise, or tobacco products for 30 minutes before checking. Empty your bladder. 2) Sit with your back supported in a flat-backed chair. Rest your arm on something flat (arm of the chair, table, etc). 3) Sit still with your feet flat on the floor, resting, for at least 5 minutes.  4) Check your blood pressure. Take 1-2 readings.  5) Write down these readings and bring with you to any provider appointments.  Bring your home blood pressure machine with you to a provider's office for accuracy comparison at least once a year.   Make sure you take your blood pressure medications before you come to any office visit, even if you were asked to fast for labs.   Take care!  Catie Eppie Gibson, PharmD, BCACP, CPP Clinical Pharmacist [facility] [phone]

## 2023-03-06 ENCOUNTER — Ambulatory Visit
Admission: RE | Admit: 2023-03-06 | Discharge: 2023-03-06 | Disposition: A | Discharge status: Home / Self Care | Payer: Medicare Other | Source: Ambulatory Visit | Attending: Internal Medicine | Admitting: Internal Medicine

## 2023-03-06 DIAGNOSIS — Z1231 Encounter for screening mammogram for malignant neoplasm of breast: Secondary | ICD-10-CM

## 2023-03-07 ENCOUNTER — Other Ambulatory Visit: Payer: Self-pay

## 2023-03-09 ENCOUNTER — Other Ambulatory Visit: Payer: Medicare Other | Admitting: Pharmacist

## 2023-03-11 ENCOUNTER — Encounter: Payer: Self-pay | Admitting: Internal Medicine

## 2023-03-11 DIAGNOSIS — M5489 Other dorsalgia: Secondary | ICD-10-CM | POA: Insufficient documentation

## 2023-03-11 DIAGNOSIS — W19XXXA Unspecified fall, initial encounter: Secondary | ICD-10-CM | POA: Insufficient documentation

## 2023-03-11 DIAGNOSIS — S20419A Abrasion of unspecified back wall of thorax, initial encounter: Secondary | ICD-10-CM | POA: Insufficient documentation

## 2023-03-11 NOTE — Assessment & Plan Note (Signed)
Occurred on 02/26/23. She is advised to plant her feet in the middle of her stepstool on not on the edge.

## 2023-03-11 NOTE — Assessment & Plan Note (Signed)
She is due for repeat colonoscopy in 2027, last one done in 2022.

## 2023-03-11 NOTE — Assessment & Plan Note (Signed)
This is result of her fall. She will take Tylenol prn.

## 2023-03-11 NOTE — Assessment & Plan Note (Signed)
I applied Bacitracin to affected area. She was advised to have a family member apply to her back 1-2 times daily until fully healed.

## 2023-03-13 ENCOUNTER — Telehealth: Payer: Self-pay

## 2023-03-13 NOTE — Telephone Encounter (Signed)
[facility] pt requesting info about NOV PREP times available.   LV[facility] pt. With update, have pt on the list for NOV 1030a MW class likely will have afternoon classes also. Will call her after current class finishes.

## 2023-03-27 LAB — HM DIABETES EYE EXAM

## 2023-04-17 NOTE — Progress Notes (Signed)
04/18/2023 Name: Sylvia Kelly MRN: [id number] DOB: 06-Mar-1956  Chief Complaint  Patient presents with   Hyperlipidemia   Hypertension   Diabetes    Sylvia Kelly is a 67 y.o. year old female who presented for a telephone visit.   They were referred to the pharmacist by their PCP for assistance in managing diabetes, hypertension, and hyperlipidemia.    Subjective:  Care Team: Primary Care Provider: Dorothyann Peng, Kelly ; Next Scheduled Visit: 07/24/2022  Medication Access/Adherence  Current Pharmacy:  [id number] [place], [place] - [address] AT [facility] [address] [place] [address] Phone: [phone] Fax: [phone]  [facility] - [address] [place], [place] - [address] [place] [address] Phone: [phone] Fax: [phone]  [facility] - [place], [place] - [address] [facility] AT Portal to Registered Caremark Sites One [place] [address] Phone: [phone] Fax: [phone]   Diabetes:  Current medications: Farxiga 10 mg daily, Tresiba 32 units, Ozempic 1 mg weekly Medications tried in the past: metformin but patient reports tolerating it okay  Current glucose readings:  - AM: 136 this morning; 11/19 120, 11/18 99, 11/17 85  - 2-3 pm: 85  - Reports some lows in the mid-60s but didn't feel poorly during this time  Using glucose meter; testing 1-2 time daily in the morning (fasting) and in the afternoon  Patient denies hypoglycemic s/sx including dizziness, shakiness, sweating. Patient denies hyperglycemic symptoms including polyuria, polydipsia, polyphagia, nocturia, neuropathy, blurred vision.  Current meal patterns:  - Breakfast: sometimes protein shake; sometimes oatmeal; sometimes boiled egg - Lunch: sandwiches; protein shakes  - Supper: reports this is usually a smaller meal; yesterday had  chicken pasta and cornbread (small servings) - Drinks: water; loves apple juice  - No changes in diet since last visit. Reports making more casseroles to make life easier. Has a food partner in [place] that is also calories conscious she talks to for recipes.   Current physical activity: Planning on starting the [employer] program - 12 weeks - waiting on someone to call her back about joining; currently walking around neighborhood   Current medication access support: Sylvia Kelly through patient assistance, note of approval for [employer] in the Media chart    Hypertension:  Current medications: lisinopril/hydrochlorothiazide 10-12.5 mg daily   Patient has a validated, automated, upper arm home BP cuff Current blood pressure readings readings: hasn't been checking at home  Patient denies hypotensive s/sx including dizziness, lightheadedness.  Patient denies hypertensive symptoms including headache, chest pain, shortness of breath   Hyperlipidemia/ASCVD Risk Reduction  Current lipid lowering medications: atorvastatin 80 mg daily, ezetimibe 10 mg daily   Antiplatelet regimen: aspirin 81 mg daily   ASCVD History: none Family History: diabetes (mother), heart disease (mother) Risk Factors: HTN, DM  The 10-year ASCVD risk score (Arnett DK, et al., 2019) is: 17.9%   Values used to calculate the score:     Age: 89 years     Sex: Female     Is Non-Hispanic African American: Yes     Diabetic: Yes     Tobacco smoker: No     Systolic Blood Pressure: 120 mmHg     Is BP treated: Yes     HDL Cholesterol: 52 mg/dL     Total Cholesterol: 174 mg/dL    Objective:  Lab Results  Component Value Date  HGBA1C 7.4 (H) 02/28/2023    Lab Results  Component Value Date   CREATININE 0.89 01/18/2023   BUN 19 01/18/2023   NA 138 01/18/2023   Kelly 4.1 01/18/2023   CL 98 01/18/2023   CO2 27 01/18/2023    Lab Results  Component Value Date   CHOL 174 01/18/2023   HDL 52 01/18/2023   LDLCALC 105 (H)  01/18/2023   TRIG 92 01/18/2023   CHOLHDL 3.3 01/18/2023    Medications Reviewed Today     Reviewed by Sylvia Kelly, [facility] (Pharmacist) on 04/18/23 at 0909  Med List Status: <None>   Medication Order Taking? Sig Documenting Provider Last Dose Status Informant  aspirin 81 MG tablet [id number] Yes Take 81 mg by mouth daily. Provider, Historical, Kelly Taking Active   atorvastatin (LIPITOR) 80 MG tablet [id number] Yes TAKE 1 TABLET BY MOUTH EVERY DAY Sylvia Kelly Taking Active   Cholecalciferol (VITAMIN D3) 50 MCG (2000 UT) capsule [id number] Yes Take 2,000 Units by mouth daily. Provider, Historical, Kelly Taking Active   dapagliflozin propanediol (FARXIGA) 10 MG TABS tablet [id number] Yes Take by mouth daily. Provider, Historical, Kelly Taking Active   dextrose 5 % solution [id number]   Sylvia Kelly  Active   ezetimibe (ZETIA) 10 MG tablet [id number] Yes Take 1 tablet (10 mg total) by mouth daily. Sylvia Kelly Taking Active   famotidine (PEPCID) 10 MG tablet [id number] Yes Take 10 mg by mouth 2 (two) times daily. Provider, Historical, Kelly Taking Active            Med Note Sylvia Kelly, Sylvia Kelly   Wed Apr 18, 2023  9:07 AM) Taking once daily as needed   fluticasone (FLONASE) 50 MCG/ACT nasal spray [id number] No SPRAY 1 SPRAY INTO EACH NOSTRIL EVERY DAY  Patient not taking: Reported on 03/05/2023   Sylvia Kelly Not Taking Active   insulin degludec (TRESIBA FLEXTOUCH) 200 UNIT/ML FlexTouch Pen [id number] Yes Inject 32 Units into the skin at bedtime. Sylvia Kelly Taking Active   Insulin Pen Needle (B-D UF III MINI PEN NEEDLES) 31G X 5 MM MISC [id number]  USE AS DIRECTED WITH Sylvia Cornea, Kelly  Active   lisinopril-hydrochlorothiazide (ZESTORETIC) 10-12.5 MG tablet [id number] Yes Take 1 tablet by mouth daily. Sylvia Kelly Taking Active   Multiple Vitamin (MULTIVITAMIN) tablet [id number] Yes Take 1 tablet by mouth daily. Provider, Historical, Kelly Taking Active   OZEMPIC, 1 MG/DOSE, 4  MG/3ML SOPN [id number] Yes INJECT 1MG  SUBCUTANEOUSLY  ONCE A WEEK Sylvia Felts, Sylvia Kelly Taking Active   triamcinolone cream (KENALOG) 0.1 % [id number] Yes APPLY TO AFFECTED AREA TWICE DAILY AS NEEDED Sylvia Kelly Taking Active             Assessment/Plan:   Diabetes: - Currently uncontrolled based on last A1c of 7.1% (goal <7%), however, patient is close to goal and home blood glucose readings have improved. Since patient tolerated metformin in the past, recommend re-trialing medication in hopes of eventually stopping insulin.  - Reviewed goal A1c, goal fasting, and goal 2 hour post prandial glucose - Recommend to continue Farxiga 10 mg daily and Ozempic 1 mg weekly, decrease Tresiba to 26 units daily, and begin metformin XR 500 mg twice daily.  - Recommend rechecking A1c at next PCP visit in February   - Recommend to continue checking glucose as currently doing so  - Meets financial criteria for [place] patient assistance program through drug company. Will fill out patient assistance paperwork for patient  to sign and leave at front desk   Hypertension: - Currently controlled based on last office readings (goal <130/80 mmHg) - Reviewed long term cardiovascular and renal outcomes of uncontrolled blood pressure - Reviewed appropriate blood pressure monitoring technique and reviewed goal blood pressure. Recommended to check home blood pressure and heart rate a few times a week. Patient plans to write on her calendar to begin checking BP - Recommend to continue lisinopril/hydrochlorothiazide 10-12.5 mg daily   Hyperlipidemia/ASCVD Risk Reduction: - Currently uncontrolled based on last LDL of 105 two months ago (goal LDL <70) - Recommend to continue current regimen - atorvastatin 80 mg daily, ezetimibe 10 mg daily, and ASA 81 mg daily - Recommend rechecking lipid panel at next PCP visit in February to follow-up on changes    Follow Up Plan: 06/04/2023 with pharmacist via telephone   Sylvia Kelly, PharmD PGY1 Pharmacy Resident 04/18/2023 9:09 AM   I have reviewed the pharmacist's encounter and agree with their documentation.   Catie Eppie Gibson, PharmD, BCACP, CPP [facility] [phone]

## 2023-04-18 ENCOUNTER — Other Ambulatory Visit: Payer: Self-pay | Admitting: Pharmacist

## 2023-04-18 ENCOUNTER — Other Ambulatory Visit: Payer: Medicare Other | Admitting: Pharmacist

## 2023-04-18 DIAGNOSIS — E1140 Type 2 diabetes mellitus with diabetic neuropathy, unspecified: Secondary | ICD-10-CM

## 2023-04-18 DIAGNOSIS — I10 Essential (primary) hypertension: Secondary | ICD-10-CM

## 2023-04-18 DIAGNOSIS — Z794 Long term (current) use of insulin: Secondary | ICD-10-CM

## 2023-04-18 MED ORDER — TRESIBA FLEXTOUCH 200 UNIT/ML ~~LOC~~ SOPN: 26.0000 [IU] | PEN_INJECTOR | Freq: Every day | SUBCUTANEOUS | Status: AC

## 2023-04-18 NOTE — Progress Notes (Signed)
Pharmacy Medication Assistance Program Note    04/18/2023  Patient ID: Sylvia Kelly, female   DOB: 07-20-1955, 67 y.o.   MRN: [id number]     04/18/2023  Outreach Medication One  Initial Outreach Date (Medication One) 04/18/2023  Manufacturer Medication One [employer] Drugs Pen Needles  Dose of Ozempic 1 mg  Dose of Tresiba U100  Type of Radiographer, therapeutic Assistance  Date Application Sent to Patient 04/18/2023  Application Items Requested Application  Date Application Sent to Prescriber 04/18/2023  Name of Prescriber Dorothyann Peng  Date Application Received From Patient 04/18/2023  Application Items Received From Patient Application  Date Application Received From Provider 04/18/2023  Date Application Submitted to Manufacturer 04/18/2023  Method Application Sent to Manufacturer Online          04/18/2023  Outreach Medication Two  Initial Outreach Date (Medication Two) 04/18/2023  Manufacturer Medication Two [employer]  [employer] Drugs Farxiga  Dose of Farxiga 10 mg  Type of Radiographer, therapeutic Assistance  Date Application Sent to Patient 04/18/2023  Application Items Requested Application  Date Application Sent to Prescriber 04/18/2023  Name of Prescriber Dorothyann Peng  Date Application Received From Patient 04/18/2023  Application Items Received From Patient Application  Date Application Received From Provider 04/18/2023  Method Application Sent to Manufacturer Fax  Date Application Submitted to Manufacturer 04/18/2023      Catie TClearance Coots, PharmD, BCACP, CPP Clinical Pharmacist [facility] [phone]

## 2023-04-18 NOTE — Patient Instructions (Signed)
Hi Ms. Razavi,   It was a pleasure speaking with you today!  Continue Farxiga 10 mg daily and Ozempic 1 mg weekly, decrease Tresiba to 26 units daily, and begin metformin XR 500 mg twice daily Continue checking your blood sugars like you've been doing Check your blood pressure periodically, and any time you have concerning symptoms like headache, chest pain, dizziness, shortness of breath, or vision changes.   Our goal is less than 130/80.  To appropriately check your blood pressure, make sure you do the following:  1) Avoid caffeine, exercise, or tobacco products for 30 minutes before checking. Empty your bladder. 2) Sit with your back supported in a flat-backed chair. Rest your arm on something flat (arm of the chair, table, etc). 3) Sit still with your feet flat on the floor, resting, for at least 5 minutes.  4) Check your blood pressure. Take 1-2 readings.  5) Write down these readings and bring with you to any provider appointments.  Bring your home blood pressure machine with you to a provider's office for accuracy comparison at least once a year.   Make sure you take your blood pressure medications before you come to any office visit, even if you were asked to fast for labs.   Have a happy Thanksgiving and [place] Christmas!   Thank you! Roslyn Smiling, PharmD

## 2023-04-19 ENCOUNTER — Other Ambulatory Visit: Payer: Self-pay | Admitting: Internal Medicine

## 2023-04-29 ENCOUNTER — Other Ambulatory Visit: Payer: Self-pay | Admitting: Internal Medicine

## 2023-04-29 MED ORDER — TRIAMCINOLONE ACETONIDE 0.1 % EX CREA: TOPICAL_CREAM | CUTANEOUS | 1 refills | Status: DC

## 2023-04-30 ENCOUNTER — Other Ambulatory Visit: Payer: Self-pay | Admitting: Pharmacist

## 2023-04-30 ENCOUNTER — Encounter: Payer: Self-pay | Admitting: Internal Medicine

## 2023-04-30 DIAGNOSIS — E1140 Type 2 diabetes mellitus with diabetic neuropathy, unspecified: Secondary | ICD-10-CM

## 2023-04-30 NOTE — Progress Notes (Signed)
Received fax from [employer] that they could not verify patient's insurance information. I have faxed this in.   Catie Eppie Gibson, PharmD, BCACP, CPP Clinical Pharmacist [facility] [phone]

## 2023-05-02 NOTE — Progress Notes (Signed)
Pharmacy Medication Assistance Program Note    05/02/2023  Patient ID: Sylvia Kelly, female  DOB: 19-Nov-1955, 67 y.o.  MRN:  [id number]     04/18/2023 05/02/2023  Outreach Medication Two  Initial Outreach Date (Medication Two) 04/18/2023 04/18/2023  Manufacturer Medication Two [employer]  [employer] Drugs Marcelline Deist Farxiga  Dose of Farxiga 10 mg   Type of Forensic scientist Assistance  Date Application Sent to Patient 04/18/2023 04/18/2023  Application Items Requested Application Application  Date Application Sent to Prescriber 04/18/2023 04/18/2023  Name of Prescriber Robyn Gillian Shields  Date Application Received From Patient 04/18/2023   Application Items Received From Patient Application Application  Date Application Received From Provider 04/18/2023 04/18/2023  Method Application Sent to Manufacturer Fax Fax  Date Application Submitted to Manufacturer 04/18/2023 04/18/2023  Patient Assistance Determination  Approved  Approval Start Date  05/30/2023  Patient Notification Method  MyChart       Catie Eppie Gibson, PharmD, BCACP, CPP Clinical Pharmacist [facility] [phone]

## 2023-05-07 ENCOUNTER — Telehealth: Payer: Self-pay | Admitting: *Deleted

## 2023-05-07 NOTE — Telephone Encounter (Signed)
Returned my call regarding PREP Class. To begin at the [facility] 06/18/2023 every M/W from 1030-1145 for 12 weeks. Will contact to schedule intake assessment in January.

## 2023-05-07 NOTE — Telephone Encounter (Signed)
Contacted regarding PREP Class referral. Left voice message to return my call for more information.

## 2023-05-15 NOTE — Telephone Encounter (Signed)
Abstraction completed. Will review for the eye exam to be scanned to the chart.

## 2023-06-04 ENCOUNTER — Telehealth: Payer: Self-pay | Admitting: *Deleted

## 2023-06-04 ENCOUNTER — Other Ambulatory Visit (INDEPENDENT_AMBULATORY_CARE_PROVIDER_SITE_OTHER): Payer: Medicare Other | Admitting: Pharmacist

## 2023-06-04 DIAGNOSIS — Z794 Long term (current) use of insulin: Secondary | ICD-10-CM

## 2023-06-04 DIAGNOSIS — E1140 Type 2 diabetes mellitus with diabetic neuropathy, unspecified: Secondary | ICD-10-CM

## 2023-06-04 DIAGNOSIS — I10 Essential (primary) hypertension: Secondary | ICD-10-CM

## 2023-06-04 MED ORDER — METFORMIN HCL ER 500 MG PO TB24: 500.0000 mg | ORAL_TABLET | Freq: Two times a day (BID) | ORAL | 3 refills | Status: DC

## 2023-06-04 NOTE — Progress Notes (Signed)
 06/04/2023 Name: Sylvia Kelly MRN: [id number] DOB: 15-Jul-1955  Chief Complaint  Patient presents with   Medication Management   Diabetes   Hypertension    Sylvia Kelly is a 68 y.o. year old female who presented for a telephone visit.   They were referred to the pharmacist by their PCP for assistance in managing diabetes.    Subjective:  Care Team: Primary Care Provider: Jarold Medici, MD ; Next Scheduled Visit: 07/24/22  Medication Access/Adherence  Current Pharmacy:  [id number] [place], [place] - [address] AT [facility] [address] [place] [address] Phone: [phone] Fax: [phone]  [facility] - [address] [place], [place] - [address] [place] [address] Phone: [phone] Fax: [phone]  [facility] - [place], [place] - [facility] AT Portal to Registered Caremark Sites [address] [place] [address] Phone: [phone] Fax: [phone]   Patient reports affordability concerns with their medications: No  Patient reports access/transportation concerns to their pharmacy: No  Patient reports adherence concerns with their medications:  No     Diabetes:  Current medications: discussed metformin  last visit but was not ordered; Ozempic  1 mg weekly, Tresiba  32 units daily, Farxiga  10 mg daily Medications tried in the past: previously on metformin , but denied any issues   Current glucose readings: fastings: 80-110s; reports 1 episode of glucose <70  Patient denies hypoglycemic s/sx including dizziness, shakiness, sweating. Patient denies hyperglycemic symptoms including polyuria, polydipsia, polyphagia, nocturia, neuropathy, blurred vision.   Current medication access support: In the midst of approval for Novo Nordisk patient assistance; approved for Farxiga  assistance   Hypertension:  Current  medications: lisinopril /hydrochlorothiazide  10/12.5 mg daily,    Hyperlipidemia/ASCVD Risk Reduction  Current lipid lowering medications: atorvastatin  80 mg daily, ezetimibe  10 mg daily  Clinical ASCVD: No  The 10-year ASCVD risk score (Arnett DK, et al., 2019) is: 17.9%   Values used to calculate the score:     Age: 40 years     Sex: Female     Is Non-Hispanic African American: Yes     Diabetic: Yes     Tobacco smoker: No     Systolic Blood Pressure: 120 mmHg     Is BP treated: Yes     HDL Cholesterol: 52 mg/dL     Total Cholesterol: 174 mg/dL     Objective:  Lab Results  Component Value Date   HGBA1C 7.4 (H) 02/28/2023    Lab Results  Component Value Date   CREATININE 0.89 01/18/2023   BUN 19 01/18/2023   NA 138 01/18/2023   Kelly 4.1 01/18/2023   CL 98 01/18/2023   CO2 27 01/18/2023    Lab Results  Component Value Date   CHOL 174 01/18/2023   HDL 52 01/18/2023   LDLCALC 105 (H) 01/18/2023   TRIG 92 01/18/2023   CHOLHDL 3.3 01/18/2023    Medications Reviewed Today     Reviewed by Sylvia Kelly, RPH-CPP (Pharmacist) on 06/04/23 at [phone]  Med List Status: <None>   Medication Order Taking? Sig Documenting Provider Last Dose Status Informant  aspirin 81 MG tablet [id number] No Take 81 mg by mouth daily. [provider] Taking Active   atorvastatin  (LIPITOR) 80 MG tablet [id number]  TAKE 1 TABLET BY MOUTH EVERY DAY Sylvia Medici, MD  Active   Cholecalciferol (VITAMIN D3) 50 MCG (2000 UT) capsule [id number] No  Take 2,000 Units by mouth daily. [provider] Taking Active   dapagliflozin  propanediol (FARXIGA ) 10 MG TABS tablet [id number] No Take by mouth daily. [provider] Taking Active   dextrose  5 % solution [id number]   Nandigam, Kavitha V, MD  Active   ezetimibe  (ZETIA ) 10 MG tablet [id number] No Take 1 tablet (10 mg total) by mouth daily. Sylvia Medici, MD Taking Active   famotidine  (PEPCID ) 10 MG tablet [id number] No Take 10 mg by  mouth 2 (two) times daily. [provider] Taking Active            Med Note Sylvia Kelly   Wed Apr 18, 2023  9:07 AM) Taking once daily as needed   fluticasone  (FLONASE ) 50 MCG/ACT nasal spray [id number] No SPRAY 1 SPRAY INTO EACH NOSTRIL EVERY DAY  Patient not taking: Reported on 03/05/2023   Raspet, Erin K, PA-C Not Taking Active   insulin  degludec (TRESIBA  FLEXTOUCH) 200 UNIT/ML FlexTouch Pen [id number]  Inject 26 Units into the skin at bedtime. Sylvia Medici, MD  Active   Insulin  Pen Needle (B-D UF III MINI PEN NEEDLES) 31G X 5 MM MISC [id number] No USE AS DIRECTED WITH ELLAMAE Sylvia Medici, MD Taking Active   lisinopril -hydrochlorothiazide  (ZESTORETIC ) 10-12.5 MG tablet [id number] No Take 1 tablet by mouth daily. Sylvia Medici, MD Taking Active   Multiple Vitamin (MULTIVITAMIN) tablet [id number] No Take 1 tablet by mouth daily. [provider] Taking Active   OZEMPIC , 1 MG/DOSE, 4 MG/3ML SOPN [id number] No INJECT 1MG  SUBCUTANEOUSLY  ONCE A WEEK Sylvia Speaks, FNP Taking Active   triamcinolone  cream (KENALOG ) 0.1 % [id number]  APPLY TO AFFECTED AREA TWICE DAILY AS NEEDED Sylvia Medici, MD  Active               Assessment/Plan:   Diabetes: - Currently uncontrolled but improving - Reviewed goal A1c, goal fasting, and goal 2 hour post prandial glucose - Reviewed dietary modifications including: praised for continued focus on dietary modifications - Recommend to start metformin  XR 500 mg twice daily, decrease Tresiba  to 20 units daily. Contact our office if any issues with hypoglycemia - Recommend to check glucose 1-2 times daily, document, and provide at future appointments  Hypertension: - Currently controlled - Recommend to continue current regimen at this time   Hyperlipidemia/ASCVD Risk Reduction: - Currently uncontrolled.  - Recommend to continue current regimen at this time    Follow Up Plan: PCP visit in 6 weeks  Catie IVAR Centers, PharmD, BCACP,  CPP Clinical Pharmacist [facility] [phone]

## 2023-06-04 NOTE — Telephone Encounter (Signed)
 Intake assessment appointment for PREP class at the [facility] scheduled for 1000 on 06/11/2023.

## 2023-06-07 ENCOUNTER — Telehealth: Payer: Self-pay

## 2023-06-07 NOTE — Telephone Encounter (Signed)
 PAP: Patient assistance application Ozempic  and Tresiba  for has been approved by PAP Companies: NovoNordisk from 06/07/2023 to 05/28/2024. Medication should be delivered to PAP Delivery: Provider's office For further shipping updates, please contact Novo Nordisk at [phone] Pt ID is: [id number]

## 2023-06-11 ENCOUNTER — Encounter: Payer: Self-pay | Admitting: *Deleted

## 2023-06-12 NOTE — Progress Notes (Signed)
 [employer] PREP Evaluation  Patient Details  Name: Sylvia Kelly MRN: [id number] Date of Birth: 07-May-1956 Age: 68 y.o. PCP: Jarold Medici, MD  Vitals:   06/11/23 1030  BP: (!) 102/52  Pulse: 87  Resp: 20  SpO2: 98%  Weight: 142 lb 6.4 oz (64.6 kg)  Height: 5' 3 (1.6 m)     [employer] Eval - 06/11/23 1030       [facility] PREP Location   [facility] PREP Location [facility]      Referral    Referring Provider Sanders    Reason for referral Diabetes;High Cholesterol;Hypertension;Inactivity    Program Start Date 06/18/23    Program End Date 09/05/23      Measurement   Waist Circumference 36 inches    Hip Circumference 36.75 inches    Body fat 35.9 percent      Information for Trainer   Goals Establish an exercise routine, increase strength, improve muscle mass    Current Exercise some walking    Orthopedic Concerns cervical and lumbar spine arthrtis   self reported   Pertinent Medical History DM. HTN    Current Barriers None indicated    Restrictions/Precautions Diabetic snack before exercise      Mobility and Daily Activities   I find it easy to walk up or down two or more flights of stairs. 3    I have no trouble taking out the trash. 4    I do housework such as vacuuming and dusting on my own without difficulty. 4    I can easily lift a gallon of milk (8lbs). 4    I can easily walk a mile. 4    I have no trouble reaching into high cupboards or reaching down to pick up something from the floor. 3    I do not have trouble doing out-door work such as loss adjuster, chartered, raking leaves, or gardening. 4      Mobility and Daily Activities   I feel younger than my age. 4    I feel independent. 4    I feel energetic. 4    I live an active life.  4    I feel strong. 4    I feel healthy. 4    I feel active as other people my age. 4      How fit and strong are you.   Fit and Strong Total Score 54            Past Medical History:  Diagnosis Date   Allergy    Arthritis     hip   Atrophic vaginitis    Diabetes mellitus    Elevated cholesterol    Hypertension    Neuromuscular disorder (HCC)    neuropathy hands and feet   Past Surgical History:  Procedure Laterality Date   COLONOSCOPY     10 yrs ago- [employer] - normal per pt   OVARIAN CYST SURGERY  05/29/1990   REFRACTIVE SURGERY     TUBAL LIGATION     Social History   Tobacco Use  Smoking Status Never  Smokeless Tobacco Never    [place] 06/12/2023, 8:52 AM  PREP Class to begin 06/18/2023 every Monday/Wednesday 1030-1145 for 12 weeks. No barriers to participation noted.

## 2023-06-17 IMAGING — US US THYROID
1 series · 13 of 25 positions shown · non-contrast
Comparison: 09/29/2020

CLINICAL DATA: Thyroid nodules

EXAM:
THYROID ULTRASOUND
TECHNIQUE: Ultrasound examination of the thyroid gland and adjacent soft
tissues was performed.

[Series 1: us thyroid · 0.08mm/px · 13 of 54 slices shown]
[im 1/54]
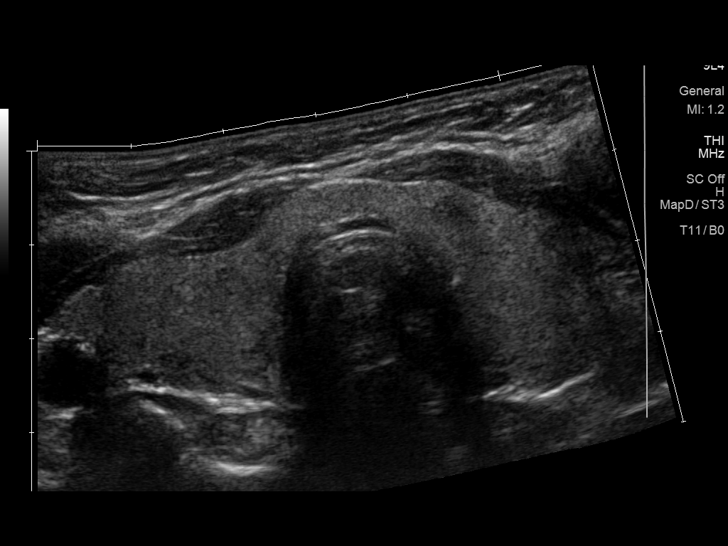
[im 5/54]
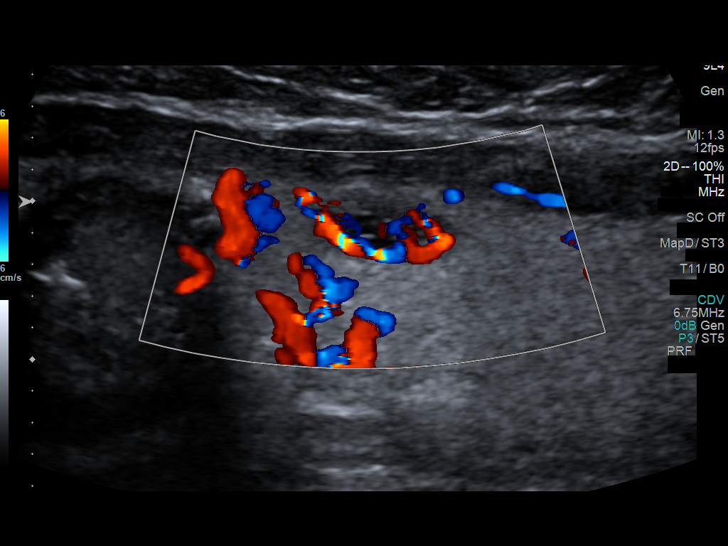
[im 9/54]
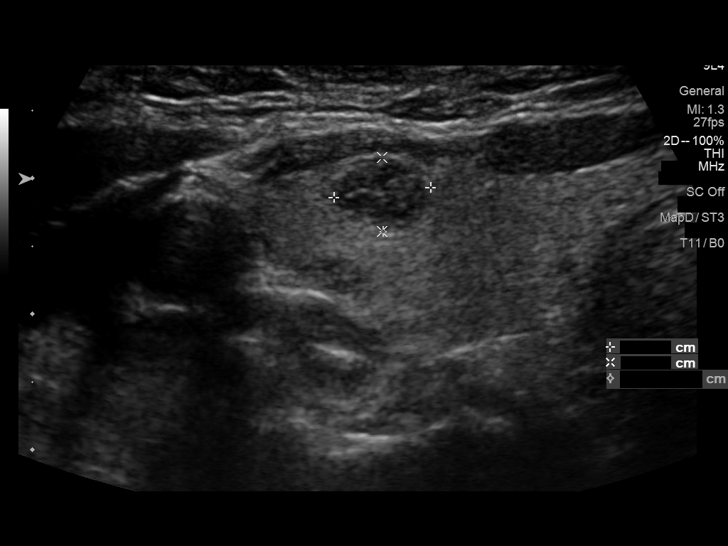
[im 14/54]
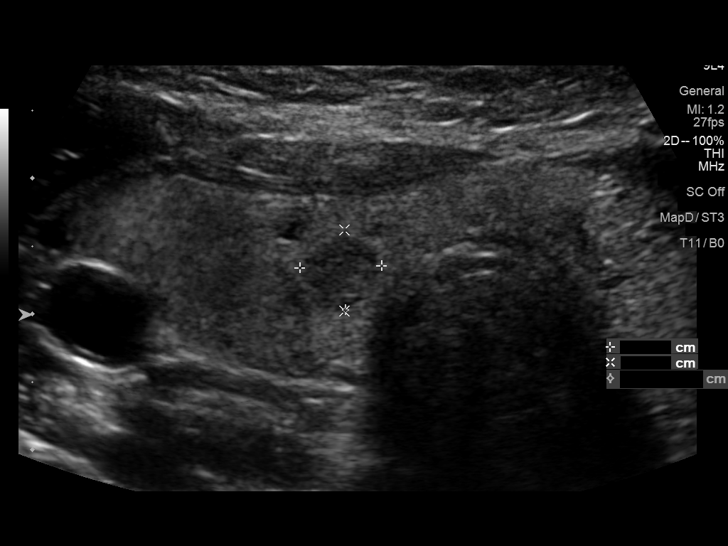
[im 18/54]
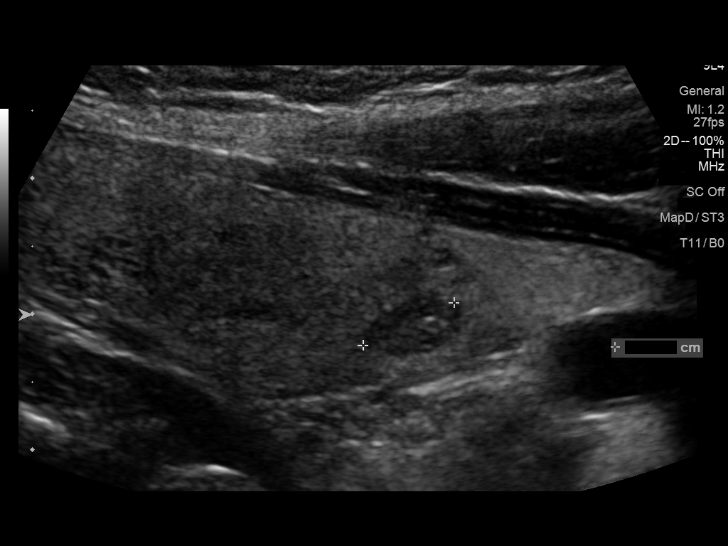
[im 23/54]
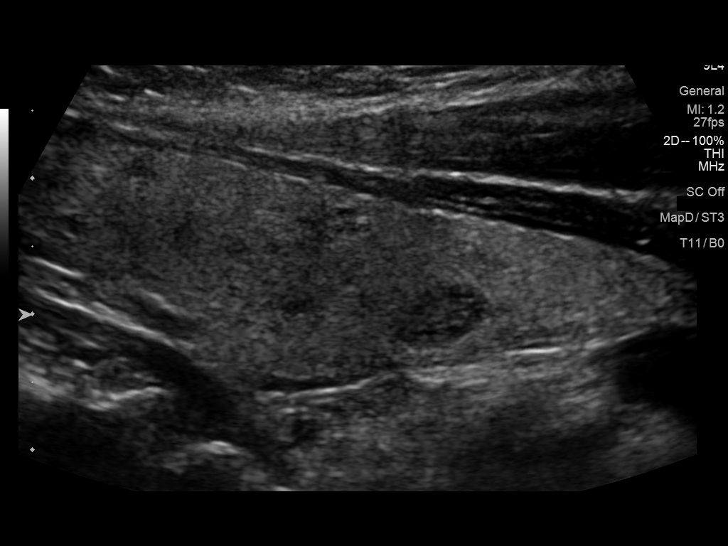
[im 27/54]
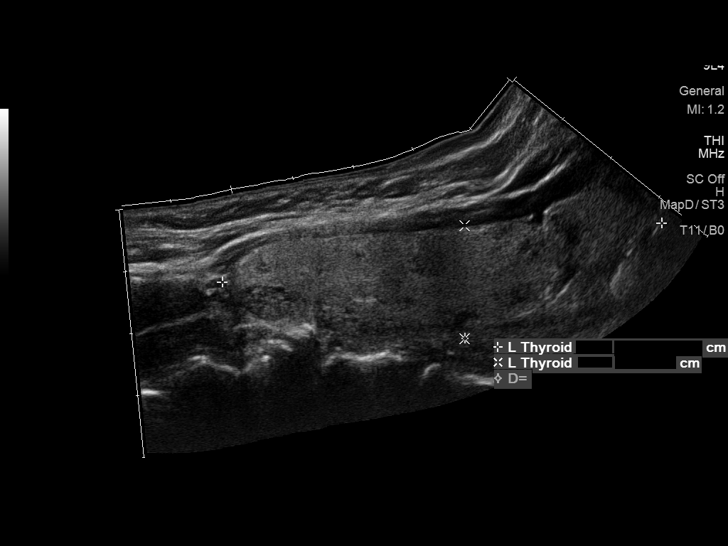
[im 31/54]
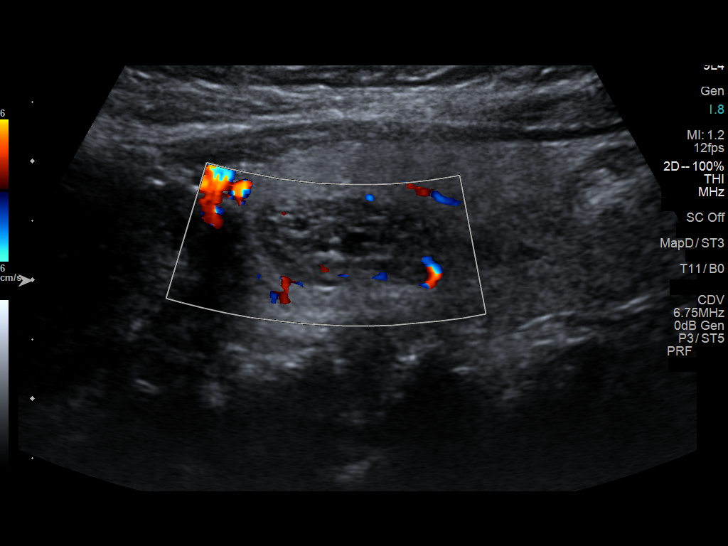
[im 36/54]
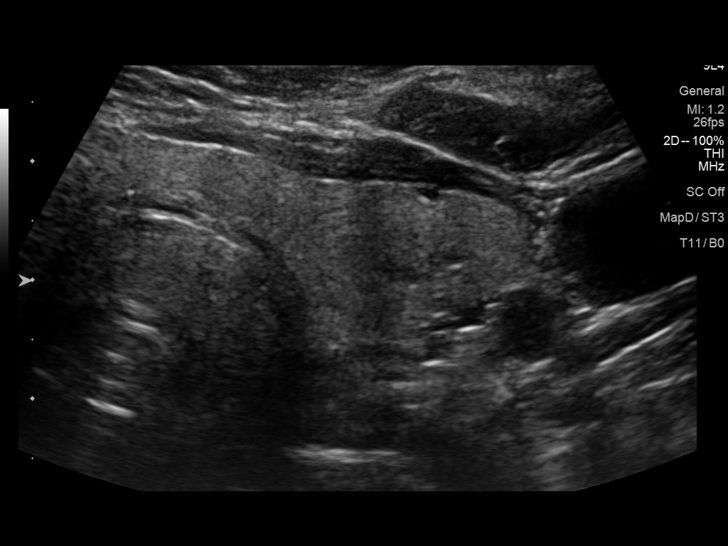
[im 40/54]
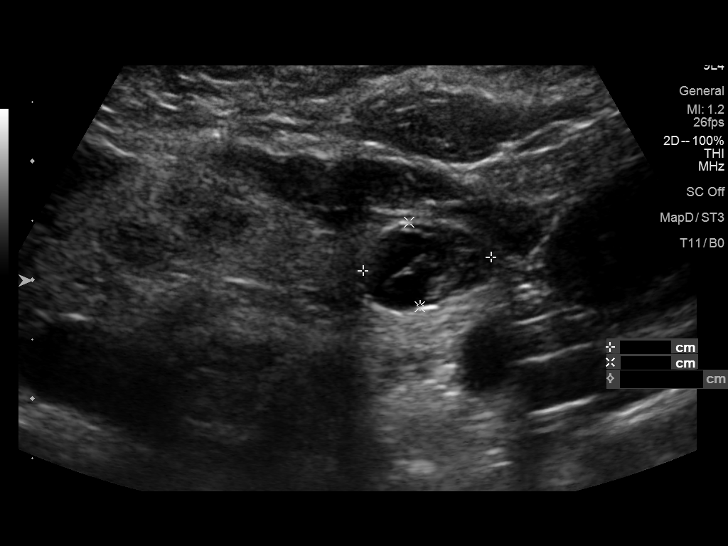
[im 45/54]
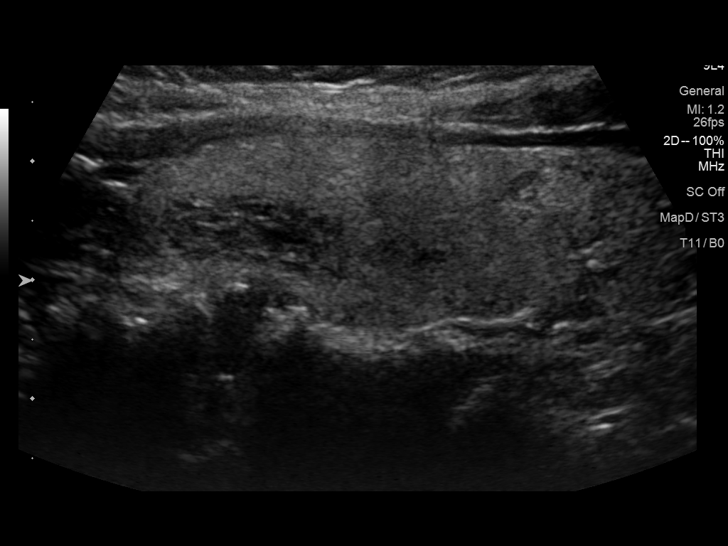
[im 49/54]
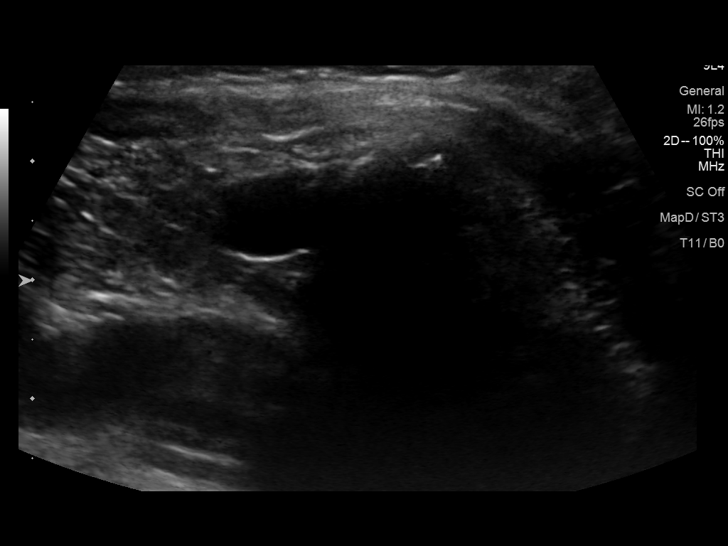
[im 54/54]
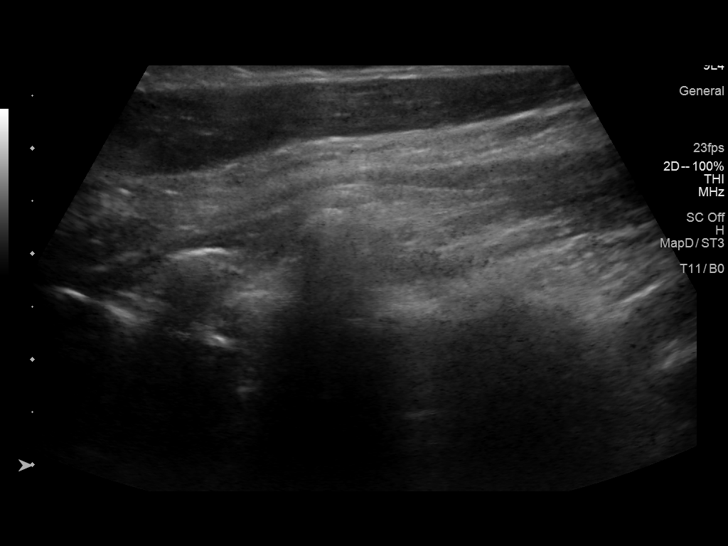

[13 of 25 positions shown; findings below may reference images not displayed]

FINDINGS: Parenchymal Echotexture: Mildly heterogenous

Isthmus: 5 mm

Right lobe: 7.4 x 1.6 x 2.7 cm

Left lobe: 7.1 x 1.8 x 2.3 cm

_________________________________________________________

Estimated total number of nodules >/= 1 cm: 5

Number of spongiform nodules >/=  2 cm not described below (TR1): 0

Number of mixed cystic and solid nodules >/= 1.5 cm not described
below (TR2): 0

_________________________________________________________

Nodule 1 represents a stable left isthmus hypoechoic mixed
cystic/solid TR 3 nodule measuring 12 mm, previously 11 mm. No
follow-up or biopsy indicated.

Nodule 3 also represents a stable right mid thyroid hypoechoic mixed
cystic/solid TR 3 type nodule measuring 12 mm, previously 11 mm. No
follow-up or biopsy indicated.

Nodule 6 represents a stable left superior thyroid hypoechoic mixed
cystic/solid TR 3 type nodule measuring 1.6 cm, previously 1.7 cm.
Nodule meets criteria follow-up in 1 year.

Nodule 7 represents a left mid thyroid solid isoechoic TR 3 type
nodule measuring 1.8 cm, previously 1.6 cm. Nodule meets criteria
follow-up in 1 year.

There are several additional bilateral mixed cystic/solid and
hypoechoic nodules again noted, all measuring 11 mm or less in size.
These also would not meet criteria for any biopsy or follow-up and
are not fully described by TI rads criteria. Dominant nodules
previously characterized are all stable.

No new thyroid nodule or enlarging nodule.

No hypervascularity.  No regional adenopathy.
IMPRESSION: Stable thyroid nodules as above.

Two left thyroid TR 3 nodules (nodule 6 and 7) both meet criteria
follow-up in 1 year.

No new or enlarging thyroid abnormality.

The above is in keeping with the ACR TI-RADS recommendations - [HOSPITAL] 7521;[DATE].

## 2023-06-18 ENCOUNTER — Encounter: Payer: Self-pay | Admitting: *Deleted

## 2023-06-18 NOTE — Progress Notes (Signed)
[facility] PREP Weekly Session  Patient Details  Name: Sylvia Kelly MRN: [id number] Date of Birth: 05/05/1956 Age: 68 y.o. PCP: Dorothyann Peng, MD  There were no vitals filed for this visit.   [facility] Weekly seesion - 06/18/23 1200       [facility] "PREP" Location   [facility] "PREP" Location [facility]      Weekly Session   Topic Discussed Goal setting and welcome to the program   Introductions, text book review, percieved exertion scale, tour facility, homework: no sitting longer than 30 minutes.   Classes attended to date 1             Remo Lipps 06/18/2023, 12:41 PM  [facility] PREP Weekly Session  Patient Details  Name: Sylvia Kelly MRN: [id number] Date of Birth: 04-19-1956 Age: 68 y.o. PCP: Dorothyann Peng, MD  There were no vitals filed for this visit.   [facility] Weekly seesion - 06/18/23 1200       [facility]PREP[facility] Location   [facility]PREP[facility] Location [facility]      Weekly Session   Topic Discussed Goal setting and welcome to the program   Introductions, text book review, percieved exertion scale, tour facility, homework: no sitting longer than 30 minutes.   Classes attended to date 1             Remo Lipps 06/18/2023, 12:40 PM

## 2023-06-25 ENCOUNTER — Encounter: Payer: Self-pay | Admitting: *Deleted

## 2023-06-25 NOTE — Progress Notes (Signed)
[facility] PREP Weekly Session  Patient Details  Name: Sylvia Kelly MRN: [id number] Date of Birth: [birth year] Age: 68 y.o. PCP: Dorothyann Peng, MD  Vitals:   06/25/23 1200  Weight: 141 lb (64 kg)     [facility] Weekly seesion - 06/25/23 1200       [facility] "PREP" Location   [facility] "PREP[facility] Location [facility]      Weekly Session   Topic Discussed Other ways to be active;Importance of resistance training   Discussed national standards for cardio (150 min/wk) strength training (2-4 times/week for 20-40 mins) and benefits of each. Importance of stretching (flexability). Balance work.   Minutes exercised this week 45 minutes    Classes attended to date 3             Remo Lipps 06/25/2023, 2:57 PM

## 2023-07-02 ENCOUNTER — Encounter: Payer: Self-pay | Admitting: *Deleted

## 2023-07-02 NOTE — Progress Notes (Signed)
[facility] PREP Weekly Session  Patient Details  Name: CHERON CORYELL MRN: [id number] Date of Birth: [birth year] Age: 68 y.o. PCP: Dorothyann Peng, MD  Vitals:   07/02/23 1212  Weight: 141 lb (64 kg)     [facility] Weekly seesion - 07/02/23 1200       [facility] "PREP" Location   [facility]      Weekly Session   Topic Discussed Healthy eating tips   Components of healthy diet, water intake half body weight or 64oz unless on fluid restriction. Restrict added sugars to 24g/day for women, 36g/day for men. Sodium intake 1500mg -2300mg /day. [employer] App.   Minutes exercised this week 130 minutes    Classes attended to date 5             Remo Lipps 07/02/2023, 12:13 PM

## 2023-07-06 ENCOUNTER — Telehealth: Payer: Self-pay

## 2023-07-06 NOTE — Telephone Encounter (Signed)
 Patient notified that patient assistance was here and ready for pick up.

## 2023-07-09 ENCOUNTER — Encounter: Payer: Self-pay | Admitting: *Deleted

## 2023-07-09 NOTE — Progress Notes (Signed)
 [facility] Weekly Session  Patient Details  Name: Sylvia Kelly MRN: [id number] Date of Birth: Jul 23, 1955 Age: 68 y.o. PCP: Cleave Curling, MD  Vitals:   07/09/23 1030  Weight: 142 lb (64.4 kg)     [facility] Weekly seesion - 07/09/23 1200       [facility] "PREP" Location   [facility]      Weekly Session   Topic Discussed Health habits;Water   Water intake: half body weight in oz's or 64 oz's per day unless on fluid restriction. Sugar reduction, looking at labels for added sugars, sugar demo.            Howard Macho 07/09/2023, 7:11 PM

## 2023-07-16 ENCOUNTER — Encounter: Payer: Self-pay | Admitting: *Deleted

## 2023-07-16 NOTE — Progress Notes (Signed)
[facility] PREP Weekly Session  Patient Details  Name: Sylvia Kelly MRN: [id number] Date of Birth: 1956/05/28 Age: 68 y.o. PCP: Dorothyann Peng, MD  Vitals:   07/16/23 1213  Weight: 142 lb (64.4 kg)     [facility] Weekly seesion - 07/16/23 1200       [facility] "PREP[facility] Location   [facility]PREP[facility] Location [facility]      Weekly Session   Topic Discussed Eating for the season;Restaurant Eating   Making healthier choices when eating out, limitng salt intake to 1500-2400mg /day, label reading, salt demo   Minutes exercised this week 170 minutes    Classes attended to date 59             Remo Lipps 07/16/2023, 12:14 PM

## 2023-07-23 ENCOUNTER — Encounter: Payer: Self-pay | Admitting: *Deleted

## 2023-07-23 NOTE — Progress Notes (Signed)
 [facility] Weekly Session  Patient Details  Name: Sylvia Kelly MRN: [id number] Date of Birth: Jul 20, 1955 Age: 68 y.o. PCP: Dorothyann Peng, MD  Vitals:   07/23/23 1228  Weight: 142 lb (64.4 kg)     [facility] Weekly seesion - 07/23/23 1200       [facility] "PREP" Location   [facility]PREP[facility] Location [facility]      Weekly Session   Topic Discussed Stress management and problem solving   Importance of sleep, tips for better sleep, relaxation meditation   Minutes exercised this week 240 minutes    Classes attended to date 10             Remo Lipps 07/23/2023, 12:29 PM

## 2023-07-25 ENCOUNTER — Encounter: Payer: Self-pay | Admitting: Internal Medicine

## 2023-07-25 ENCOUNTER — Ambulatory Visit (INDEPENDENT_AMBULATORY_CARE_PROVIDER_SITE_OTHER): Payer: Medicare Other | Admitting: Internal Medicine

## 2023-07-25 ENCOUNTER — Ambulatory Visit (INDEPENDENT_AMBULATORY_CARE_PROVIDER_SITE_OTHER): Payer: Medicare Other

## 2023-07-25 VITALS — BP 102/60 | HR 105 | Temp 97.8°F | Ht 64.0 in | Wt 145.4 lb

## 2023-07-25 VITALS — BP 102/60 | HR 105 | Ht 64.0 in | Wt 145.0 lb

## 2023-07-25 DIAGNOSIS — I10 Essential (primary) hypertension: Secondary | ICD-10-CM | POA: Diagnosis not present

## 2023-07-25 DIAGNOSIS — Z794 Long term (current) use of insulin: Secondary | ICD-10-CM

## 2023-07-25 DIAGNOSIS — E1140 Type 2 diabetes mellitus with diabetic neuropathy, unspecified: Secondary | ICD-10-CM

## 2023-07-25 DIAGNOSIS — Z0000 Encounter for general adult medical examination without abnormal findings: Secondary | ICD-10-CM

## 2023-07-25 DIAGNOSIS — D649 Anemia, unspecified: Secondary | ICD-10-CM | POA: Diagnosis not present

## 2023-07-25 DIAGNOSIS — E7800 Pure hypercholesterolemia, unspecified: Secondary | ICD-10-CM

## 2023-07-25 MED ORDER — FLUTICASONE PROPIONATE 50 MCG/ACT NA SUSP: NASAL | 0 refills | Status: DC

## 2023-07-25 MED ORDER — LISINOPRIL-HYDROCHLOROTHIAZIDE 10-12.5 MG PO TABS: 1.0000 | ORAL_TABLET | Freq: Every day | ORAL | 3 refills | Status: DC

## 2023-07-25 MED ORDER — CYANOCOBALAMIN 1000 MCG/ML IJ SOLN
1000.0000 ug | Freq: Once | INTRAMUSCULAR | Status: AC
Start: 2023-07-25 — End: 2023-07-25
  Administered 2023-07-25: 1000 ug via INTRAMUSCULAR

## 2023-07-25 NOTE — Patient Instructions (Signed)
 Ms. Kobel , Thank you for taking time to come for your Medicare Wellness Visit. I appreciate your ongoing commitment to your health goals. Please review the following plan we discussed and let me know if I can assist you in the future.   Referrals/Orders/Follow-Ups/Clinician Recommendations: none  This is a list of the screening recommended for you and due dates:  Health Maintenance  Topic Date Due   COVID-19 Vaccine (7 - 2024-25 season) 04/25/2023   Yearly kidney health urinalysis for diabetes  07/07/2023   Hemoglobin A1C  08/29/2023   DTaP/Tdap/Td vaccine (3 - Td or Tdap) 09/10/2023   Yearly kidney function blood test for diabetes  01/18/2024   Complete foot exam   01/18/2024   Eye exam for diabetics  03/26/2024   Pneumonia Vaccine (3 of 3 - PPSV23 or PCV20) 05/07/2024   Medicare Annual Wellness Visit  07/24/2024   Mammogram  03/05/2025   Colon Cancer Screening  02/07/2026   Flu Shot  Completed   DEXA scan (bone density measurement)  Completed   Hepatitis C Screening  Completed   Zoster (Shingles) Vaccine  Completed   HPV Vaccine  Aged Out    Advanced directives: (Declined) Advance directive discussed with you today. Even though you declined this today, please call our office should you change your mind, and we can give you the proper paperwork for you to fill out.  Next Medicare Annual Wellness Visit scheduled for next year: No, office will schedule  insert Preventive Care attachment Insert FALL PREVENTION attachment if needed

## 2023-07-25 NOTE — Patient Instructions (Signed)

## 2023-07-25 NOTE — Progress Notes (Signed)
 I,[name], CMA,acting as a Neurosurgeon for [name], MD.,have documented all relevant documentation on the behalf of [name], MD,as directed by  [name], MD while in the presence of [name], MD.  Subjective:  Patient ID: Sylvia Kelly , female    DOB: [birth year] , 68 y.o.   MRN: [id number]  Chief Complaint  Patient presents with   Diabetes   Hypertension   Hyperlipidemia    HPI  She presents today for DM, HTN & cholesterol follow-up.  She reports compliance with meds. She denies headaches, chest pain and shortness of breath.    She is also scheduled for AWV with [facility] Advisor.   Diabetes She presents for her follow-up diabetic visit. She has type 2 diabetes mellitus. Her disease course has been improving. There are no hypoglycemic associated symptoms. Pertinent negatives for diabetes include no blurred vision, no chest pain, no fatigue, no polydipsia, no polyphagia and no polyuria. There are no hypoglycemic complications. Diabetic complications include peripheral neuropathy. Risk factors for coronary artery disease include diabetes mellitus, dyslipidemia, sedentary lifestyle, hypertension and post-menopausal.  Hypertension This is a chronic problem. The current episode started more than 1 year ago. The problem has been gradually improving since onset. Pertinent negatives include no blurred vision, chest pain, palpitations or shortness of breath. Past treatments include ACE inhibitors and diuretics. There are no compliance problems.   Hyperlipidemia This is a chronic problem. The current episode started more than 1 year ago. Exacerbating diseases include diabetes. Pertinent negatives include no chest pain or shortness of breath. Current antihyperlipidemic treatment includes statins. The current treatment provides moderate improvement of lipids. There are no compliance problems.  Risk factors for coronary artery disease include post-menopausal, hypertension,  diabetes mellitus and dyslipidemia.     Past Medical History:  Diagnosis Date   Allergy    Arthritis    hip   Atrophic vaginitis    Diabetes mellitus    Elevated cholesterol    GERD (gastroesophageal reflux disease) 2009   Hypertension    Neuromuscular disorder (HCC)    neuropathy hands and feet     Family History  Problem Relation Age of Onset   Heart disease Mother    Diabetes Mother    Cancer Father        Lung cancer   Heart disease Son    Stroke Son    Colon cancer Neg Hx    Colon polyps Neg Hx    Esophageal cancer Neg Hx    Rectal cancer Neg Hx    Stomach cancer Neg Hx      Current Outpatient Medications:    aspirin 81 MG tablet, Take 81 mg by mouth daily., Disp: , Rfl:    Cholecalciferol (VITAMIN D3) 50 MCG (2000 UT) capsule, Take 2,000 Units by mouth daily., Disp: , Rfl:    dapagliflozin propanediol (FARXIGA) 10 MG TABS tablet, Take by mouth daily., Disp: , Rfl:    ezetimibe (ZETIA) 10 MG tablet, Take 1 tablet (10 mg total) by mouth daily., Disp: 90 tablet, Rfl: 3   famotidine (PEPCID) 10 MG tablet, Take 10 mg by mouth 2 (two) times daily., Disp: , Rfl:    insulin degludec (TRESIBA FLEXTOUCH) 200 UNIT/ML FlexTouch Pen, Inject 26 Units into the skin at bedtime., Disp: , Rfl:    Insulin Pen Needle (B-D UF III MINI PEN NEEDLES) 31G X 5 MM MISC, USE AS DIRECTED WITH LEVEMIR, Disp: 150 each, Rfl: 3   metFORMIN (GLUCOPHAGE-XR)  500 MG 24 hr tablet, Take 1 tablet (500 mg total) by mouth 2 (two) times daily with a meal., Disp: 180 tablet, Rfl: 3   Multiple Vitamin (MULTIVITAMIN) tablet, Take 1 tablet by mouth daily., Disp: , Rfl:    OZEMPIC, 1 MG/DOSE, 4 MG/3ML SOPN, INJECT 1MG  SUBCUTANEOUSLY  ONCE A WEEK, Disp: 3 mL, Rfl: 2   triamcinolone cream (KENALOG) 0.1 %, APPLY TO AFFECTED AREA TWICE DAILY AS NEEDED, Disp: 30 g, Rfl: 1   atorvastatin (LIPITOR) 80 MG tablet, TAKE 1 TABLET BY MOUTH EVERY DAY, Disp: 90 tablet, Rfl: 1   fluticasone (FLONASE) 50 MCG/ACT nasal spray,  SPRAY 1 SPRAY INTO EACH NOSTRIL EVERY DAY, Disp: 16 g, Rfl: 0   lisinopril-hydrochlorothiazide (ZESTORETIC) 10-12.5 MG tablet, Take 1 tablet by mouth daily., Disp: 90 tablet, Rfl: 3  Current Facility-Administered Medications:    dextrose 5 % solution, , Intravenous, Continuous, Nandigam, Kavitha V, MD   No Known Allergies   Review of Systems  Constitutional: Negative.  Negative for fatigue.  Eyes:  Negative for blurred vision.  Respiratory: Negative.  Negative for shortness of breath.   Cardiovascular: Negative.  Negative for chest pain and palpitations.  Endocrine: Negative for polydipsia, polyphagia and polyuria.  Neurological: Negative.   Psychiatric/Behavioral: Negative.       Today's Vitals   07/25/23 1438  BP: 102/60  Pulse: (!) 105  SpO2: 98%  Weight: 145 lb (65.8 kg)  Height: 5\' 4"  (1.626 m)   Body mass index is 24.89 kg/m.  Wt Readings from Last 3 Encounters:  07/25/23 145 lb (65.8 kg)  07/25/23 145 lb 6.4 oz (66 kg)  07/23/23 142 lb (64.4 kg)     Objective:  Physical Exam Vitals and nursing note reviewed.  Constitutional:      Appearance: Normal appearance.  HENT:     Head: Normocephalic and atraumatic.  Eyes:     Extraocular Movements: Extraocular movements intact.  Cardiovascular:     Rate and Rhythm: Normal rate and regular rhythm.     Heart sounds: Normal heart sounds.  Pulmonary:     Effort: Pulmonary effort is normal.     Breath sounds: Normal breath sounds.  Musculoskeletal:     Cervical back: Normal range of motion.  Skin:    General: Skin is warm.  Neurological:     General: No focal deficit present.     Mental Status: She is alert.  Psychiatric:        Mood and Affect: Mood normal.        Behavior: Behavior normal.         Assessment And Plan:  Type 2 diabetes mellitus with diabetic neuropathy, with long-term current use of insulin (HCC) Assessment & Plan: Chronic, importance of dietary compliance was discussed with the patient.  She will continue with semaglutide 1mg  weekly and Tresiba 38 units nightly. She will f/u in 3-4 months for re-evaluation.   Orders: -     CMP14+EGFR -     Hemoglobin A1c  Essential (primary) hypertension Assessment & Plan: Chronic, well controlled.  She will continue with lisinopril/hct 10/12.5mg  daily. She is encouraged to follow low sodium diet.    Pure hypercholesterolemia Assessment & Plan: Chronic, LDL goal is less than 70.  She will continue with atorvastatin daily. She is encouraged to follow heart healthy lifestyle.    Anemia, unspecified type -     CBC -     Iron, TIBC and Ferritin Panel -     Vitamin B12 -  Cyanocobalamin  Other orders -     Fluticasone Propionate; SPRAY 1 SPRAY INTO EACH NOSTRIL EVERY DAY  Dispense: 16 g; Refill: 0 -     Lisinopril-hydroCHLOROthiazide; Take 1 tablet by mouth daily.  Dispense: 90 tablet; Refill: 3     Return in 4 months (on 11/22/2023), or dm check w/ foot exam, for 4 month dm. 1 year ov w rs & thn..  Patient was given opportunity to ask questions. Patient verbalized understanding of the plan and was able to repeat key elements of the plan. All questions were answered to their satisfaction.    I, [name], MD, have reviewed all documentation for this visit. The documentation on 07/25/23 for the exam, diagnosis, procedures, and orders are all accurate and complete.   IF YOU HAVE BEEN REFERRED TO A SPECIALIST, IT MAY TAKE 1-2 WEEKS TO SCHEDULE/PROCESS THE REFERRAL. IF YOU HAVE NOT HEARD FROM US/SPECIALIST IN TWO WEEKS, PLEASE GIVE [place] A CALL AT [phone] X [phone].   THE PATIENT IS ENCOURAGED TO PRACTICE SOCIAL DISTANCING DUE TO THE COVID-19 PANDEMIC.

## 2023-07-25 NOTE — Progress Notes (Signed)
 Subjective:   Sylvia Kelly is a 68 y.o. who presents for a Medicare Wellness preventive visit.  Visit Complete: In person    AWV Questionnaire: Yes: Patient Medicare AWV questionnaire was completed by the patient on 07/25/2023; I have confirmed that all information answered by patient is correct and no changes since this date.  Cardiac Risk Factors include: diabetes mellitus;hypertension     Objective:    Today's Vitals   07/25/23 1415  BP: 102/60  Pulse: (!) 105  Temp: 97.8 F (36.6 C)  TempSrc: Oral  SpO2: 97%  Weight: 145 lb 6.4 oz (66 kg)  Height: 5\' 4"  (1.626 m)   Body mass index is 24.96 kg/m.     07/25/2023    2:20 PM 07/06/2022    3:38 PM 06/17/2021    3:19 PM 09/28/2015    4:25 PM 04/10/2015   10:03 PM  Advanced Directives  Does Patient Have a Medical Advance Directive? No No No No No  Would patient like information on creating a medical advance directive? No - Patient declined Yes (MAU/Ambulatory/Procedural Areas - Information given) Yes (ED - Information included in AVS) No - patient declined information No - patient declined information    Current Medications (verified) Outpatient Encounter Medications as of 07/25/2023  Medication Sig   aspirin 81 MG tablet Take 81 mg by mouth daily.   atorvastatin (LIPITOR) 80 MG tablet TAKE 1 TABLET BY MOUTH EVERY DAY   Cholecalciferol (VITAMIN D3) 50 MCG (2000 UT) capsule Take 2,000 Units by mouth daily.   dapagliflozin propanediol (FARXIGA) 10 MG TABS tablet Take by mouth daily.   ezetimibe (ZETIA) 10 MG tablet Take 1 tablet (10 mg total) by mouth daily.   famotidine (PEPCID) 10 MG tablet Take 10 mg by mouth 2 (two) times daily.   insulin degludec (TRESIBA FLEXTOUCH) 200 UNIT/ML FlexTouch Pen Inject 26 Units into the skin at bedtime.   Insulin Pen Needle (B-D UF III MINI PEN NEEDLES) 31G X 5 MM MISC USE AS DIRECTED WITH LEVEMIR   lisinopril-hydrochlorothiazide (ZESTORETIC) 10-12.5 MG tablet Take 1 tablet by mouth  daily.   metFORMIN (GLUCOPHAGE-XR) 500 MG 24 hr tablet Take 1 tablet (500 mg total) by mouth 2 (two) times daily with a meal.   Multiple Vitamin (MULTIVITAMIN) tablet Take 1 tablet by mouth daily.   OZEMPIC, 1 MG/DOSE, 4 MG/3ML SOPN INJECT 1MG  SUBCUTANEOUSLY  ONCE A WEEK   triamcinolone cream (KENALOG) 0.1 % APPLY TO AFFECTED AREA TWICE DAILY AS NEEDED   fluticasone (FLONASE) 50 MCG/ACT nasal spray SPRAY 1 SPRAY INTO EACH NOSTRIL EVERY DAY (Patient not taking: Reported on 07/25/2023)   Facility-Administered Encounter Medications as of 07/25/2023  Medication   dextrose 5 % solution    Allergies (verified) Patient has no known allergies.   History: Past Medical History:  Diagnosis Date   Allergy    Arthritis    hip   Atrophic vaginitis    Diabetes mellitus    Elevated cholesterol    GERD (gastroesophageal reflux disease) 2009   Hypertension    Neuromuscular disorder (HCC)    neuropathy hands and feet   Past Surgical History:  Procedure Laterality Date   COLONOSCOPY     10 yrs ago- [employer] [facility] - normal per pt   OVARIAN CYST SURGERY  05/29/1990   REFRACTIVE SURGERY     TUBAL LIGATION     Family History  Problem Relation Age of Onset   Heart disease Mother    Diabetes Mother  Cancer Father        Lung cancer   Heart disease Son    Stroke Son    Colon cancer Neg Hx    Colon polyps Neg Hx    Esophageal cancer Neg Hx    Rectal cancer Neg Hx    Stomach cancer Neg Hx    Social History   Socioeconomic History   Marital status: Legally Separated    Spouse name: Not on file   Number of children: Not on file   Years of education: Not on file   Highest education level: Some college, no degree  Occupational History   Not on file  Tobacco Use   Smoking status: Never   Smokeless tobacco: Never  Vaping Use   Vaping status: Never Used  Substance and Sexual Activity   Alcohol use: No   Drug use: Never   Sexual activity: Not Currently    Birth control/protection:  Abstinence, Post-menopausal  Other Topics Concern   Not on file  Social History Narrative   Not on file   Social Drivers of Health   Financial Resource Strain: Low Risk  (07/25/2023)   Overall Financial Resource Strain (CARDIA)    Difficulty of Paying Living Expenses: Not hard at all  Food Insecurity: No Food Insecurity (07/25/2023)   Hunger Vital Sign    Worried About Running Out of Food in the Last Year: Never true    Ran Out of Food in the Last Year: Never true  Transportation Needs: No Transportation Needs (07/25/2023)   PRAPARE - Administrator, Civil Service (Medical): No    Lack of Transportation (Non-Medical): No  Physical Activity: Sufficiently Active (07/25/2023)   Exercise Vital Sign    Days of Exercise per Week: 5 days    Minutes of Exercise per Session: 30 min  Stress: No Stress Concern Present (07/25/2023)   [employer] of Occupational Health - Occupational Stress Questionnaire    Feeling of Stress : Not at all  Social Connections: Moderately Isolated (07/25/2023)   Social Connection and Isolation Panel [NHANES]    Frequency of Communication with Friends and Family: More than three times a week    Frequency of Social Gatherings with Friends and Family: More than three times a week    Attends Religious Services: More than 4 times per year    Active Member of [employer] or Organizations: No    Attends Banker Meetings: Never    Marital Status: Separated    Tobacco Counseling Counseling given: Not Answered    Clinical Intake:  Pre-visit preparation completed: Yes  Pain : No/denies pain     Nutritional Status: BMI of 19-24  Normal Nutritional Risks: None Diabetes: Yes CBG done?: No Did pt. bring in CBG monitor from home?: No  How often do you need to have someone help you when you read instructions, pamphlets, or other written materials from your doctor or pharmacy?: 1 - Never  Interpreter Needed?: No  Information entered by ::  NAllen LPN   Activities of Daily Living     07/25/2023    7:20 AM  In your present state of health, do you have any difficulty performing the following activities:  Hearing? 0  Vision? 0  Difficulty concentrating or making decisions? 0  Walking or climbing stairs? 0  Dressing or bathing? 0  Doing errands, shopping? 0  Preparing Food and eating ? N  Using the Toilet? N  In the past six months, have you accidently  leaked urine? N  Do you have problems with loss of bowel control? N  Managing your Medications? N  Managing your Finances? N  Housekeeping or managing your Housekeeping? N    Patient Care Team: Dorothyann Peng, MD as PCP - General (Internal Medicine) Alden Hipp, RPH-CPP (Pharmacist)  Indicate any recent Medical Services you may have received from other than [facility] providers in the past year (date may be approximate).     Assessment:   This is a routine wellness examination for [place].  Hearing/Vision screen Hearing Screening - Comments:: Denies hearing issues Vision Screening - Comments:: Regular eye exams, [facility]   Goals Addressed             This Visit's Progress    Patient Stated       07/25/2023, build muscle and better balance       Depression Screen     07/25/2023    2:22 PM 02/28/2023    9:59 AM 01/18/2023    8:23 AM 10/03/2022    3:18 PM 09/14/2022   11:33 AM 07/06/2022    3:39 PM 07/06/2022    3:04 PM  PHQ 2/9 Scores  PHQ - 2 Score 0 0 0 0 0 0 0  PHQ- 9 Score 6 0 0  0      Fall Risk     07/25/2023    7:20 AM 02/28/2023    9:59 AM 01/18/2023    8:23 AM 09/14/2022   11:33 AM 07/06/2022    3:39 PM  Fall Risk   Falls in the past year? 1 1 0 0 0  Comment miss stepped      Number falls in past yr: 0 0 0 0 0  Injury with Fall? 1 1 0 0 0  Risk for fall due to : Medication side effect History of fall(s) No Fall Risks No Fall Risks Medication side effect  Follow up Falls prevention discussed;Falls evaluation completed Falls evaluation  completed Falls evaluation completed Falls evaluation completed Falls prevention discussed;Education provided;Falls evaluation completed    MEDICARE RISK AT HOME:  Medicare Risk at Home Any stairs in or around the home?: (Patient-Rptd) Yes If so, are there any without handrails?: (Patient-Rptd) No Home free of loose throw rugs in walkways, pet beds, electrical cords, etc?: (Patient-Rptd) Yes Adequate lighting in your home to reduce risk of falls?: (Patient-Rptd) Yes Life alert?: (Patient-Rptd) No Use of a cane, walker or w/c?: (Patient-Rptd) No Grab bars in the bathroom?: (Patient-Rptd) No Shower chair or bench in shower?: (Patient-Rptd) No Elevated toilet seat or a handicapped toilet?: (Patient-Rptd) No  TIMED UP AND GO:  Was the test performed?  Yes  Length of time to ambulate 10 feet: 5 sec Gait steady and fast without use of assistive device  Cognitive Function: 6CIT completed        07/25/2023    2:24 PM 07/06/2022    3:40 PM 06/17/2021    3:22 PM  6CIT Screen  What Year? 0 points 0 points 0 points  What month? 0 points 0 points 0 points  What time? 0 points 0 points 0 points  Count back from 20 0 points 0 points 0 points  Months in reverse 0 points 0 points 0 points  Repeat phrase 0 points 0 points 0 points  Total Score 0 points 0 points 0 points    Immunizations Immunization History  Administered Date(s) Administered   DTaP 09/09/2013   Fluad Quad(high Dose 65+) 04/14/2021   Fluad  Trivalent(High Dose 65+) 02/28/2023   Influenza Inj Mdck Quad Pf 03/20/2019   Influenza,inj,Quad PF,6+ Mos 04/04/2012, 02/26/2017, 03/21/2018, 03/20/2019, 04/09/2020   Influenza-Unspecified 03/21/2018, 01/28/2022   Moderna Covid-19 Vaccine Bivalent Booster 74yrs & up 07/26/2021   Moderna Sars-Covid-2 Vaccination 07/08/2019, 08/05/2019, 03/26/2020, 01/10/2021   Pfizer(Comirnaty)Fall Seasonal Vaccine 12 years and older 02/28/2023   Pneumococcal Conjugate-13 05/21/2018   Pneumococcal  Polysaccharide-23 05/08/2019   Tdap 05/29/2012   Zoster Recombinant(Shingrix) 11/12/2020, 06/07/2021    Screening Tests Health Maintenance  Topic Date Due   COVID-19 Vaccine (7 - 2024-25 season) 04/25/2023   Diabetic kidney evaluation - Urine ACR  07/07/2023   HEMOGLOBIN A1C  08/29/2023   DTaP/Tdap/Td (3 - Td or Tdap) 09/10/2023   Diabetic kidney evaluation - eGFR measurement  01/18/2024   FOOT EXAM  01/18/2024   OPHTHALMOLOGY EXAM  03/26/2024   Pneumonia Vaccine 1+ Years old (3 of 3 - PPSV23 or PCV20) 05/07/2024   Medicare Annual Wellness (AWV)  07/24/2024   MAMMOGRAM  03/05/2025   Colonoscopy  02/07/2026   INFLUENZA VACCINE  Completed   DEXA SCAN  Completed   Hepatitis C Screening  Completed   Zoster Vaccines- Shingrix  Completed   HPV VACCINES  Aged Out    Health Maintenance  Health Maintenance Due  Topic Date Due   COVID-19 Vaccine (7 - 2024-25 season) 04/25/2023   Diabetic kidney evaluation - Urine ACR  07/07/2023   Health Maintenance Items Addressed: UACR (Urine Albumin:Creatinine Ratio)  Additional Screening:  Vision Screening: Recommended annual ophthalmology exams for early detection of glaucoma and other disorders of the eye.  Dental Screening: Recommended annual dental exams for proper oral hygiene  Community Resource Referral / Chronic Care Management: CRR required this visit?  No   CCM required this visit?  No     Plan:     I have personally reviewed and noted the following in the patient's chart:   Medical and social history Use of alcohol, tobacco or illicit drugs  Current medications and supplements including opioid prescriptions. Patient is not currently taking opioid prescriptions. Functional ability and status Nutritional status Physical activity Advanced directives List of other physicians Hospitalizations, surgeries, and ER visits in previous 12 months Vitals Screenings to include cognitive, depression, and falls Referrals and  appointments  In addition, I have reviewed and discussed with patient certain preventive protocols, quality metrics, and best practice recommendations. A written personalized care plan for preventive services as well as general preventive health recommendations were provided to patient.     Barb Merino, LPN   [id number]/95/[id number]   After Visit Summary: (In Person-Printed) AVS printed and given to the patient  Notes: Nothing significant to report at this time.

## 2023-07-26 ENCOUNTER — Other Ambulatory Visit: Payer: Self-pay | Admitting: Internal Medicine

## 2023-07-26 LAB — CMP14+EGFR
ALT: 30 [IU]/L (ref 0–32)
AST: 27 [IU]/L (ref 0–40)
Albumin: 4.3 g/dL (ref 3.9–4.9)
Alkaline Phosphatase: 94 [IU]/L (ref 44–121)
BUN/Creatinine Ratio: 26 (ref 12–28)
BUN: 25 mg/dL (ref 8–27)
Bilirubin Total: 0.3 mg/dL (ref 0.0–1.2)
CO2: 24 mmol/L (ref 20–29)
Calcium: 9.5 mg/dL (ref 8.7–10.3)
Chloride: 99 mmol/L (ref 96–106)
Creatinine, Ser: 0.98 mg/dL (ref 0.57–1.00)
Globulin, Total: 2.6 g/dL (ref 1.5–4.5)
Glucose: 83 mg/dL (ref 70–99)
Potassium: 4.3 mmol/L (ref 3.5–5.2)
Sodium: 138 mmol/L (ref 134–144)
Total Protein: 6.9 g/dL (ref 6.0–8.5)
eGFR: 63 mL/min/{1.73_m2} (ref >59)

## 2023-07-26 LAB — MICROALBUMIN / CREATININE URINE RATIO
Creatinine, Urine: 70 mg/dL
Microalb/Creat Ratio: <4 mg/g{creat} (ref 0–29)
Microalbumin, Urine: <3 ug/mL

## 2023-07-26 LAB — VITAMIN B12: Vitamin B-12: 745 pg/mL (ref 232–1245)

## 2023-07-26 LAB — IRON,TIBC AND FERRITIN PANEL
Ferritin: 230 ng/mL — ABNORMAL HIGH (ref 15–150)
Iron Saturation: 21 % (ref 15–55)
Iron: 69 ug/dL (ref 27–139)
Total Iron Binding Capacity: 333 ug/dL (ref 250–450)
UIBC: 264 ug/dL (ref 118–369)

## 2023-07-26 LAB — CBC
Hematocrit: 36.7 % (ref 34.0–46.6)
Hemoglobin: 11.9 g/dL (ref 11.1–15.9)
MCH: 29.6 pg (ref 26.6–33.0)
MCHC: 32.4 g/dL (ref 31.5–35.7)
MCV: 91 fL (ref 79–97)
Platelets: 329 10*3/uL (ref 150–450)
RBC: 4.02 x10E6/uL (ref 3.77–5.28)
RDW: 13.5 % (ref 11.7–15.4)
WBC: 5.5 10*3/uL (ref 3.4–10.8)

## 2023-07-26 LAB — HEMOGLOBIN A1C
Est. average glucose Bld gHb Est-mCnc: 171 mg/dL
Hgb A1c MFr Bld: 7.6 % — ABNORMAL HIGH (ref 4.8–5.6)

## 2023-07-29 NOTE — Assessment & Plan Note (Signed)
Chronic, well controlled.  She will continue with lisinopril/hct 10/12.5mg  daily. She is encouraged to follow low sodium diet.

## 2023-07-29 NOTE — Assessment & Plan Note (Signed)
Chronic, LDL goal is less than 70.  She will continue with atorvastatin daily. She is encouraged to follow heart healthy lifestyle.

## 2023-07-29 NOTE — Assessment & Plan Note (Signed)
Chronic, importance of dietary compliance was discussed with the patient. She will continue with semaglutide 1mg  weekly and Tresiba 38 units nightly. She will f/u in 3-4 months for re-evaluation.

## 2023-07-30 ENCOUNTER — Encounter: Payer: Self-pay | Admitting: *Deleted

## 2023-07-30 NOTE — Progress Notes (Signed)
 [employer] PRE[facility] Weekly Session  Patient Details  Name: Sylvia Kelly MRN: [id number] Date of Birth: 1956-02-06 Age: 68 y.o. PCP: Dorothyann Peng, MD  Vitals:   07/30/23 1155  Weight: 145 lb (65.8 kg)     [employer] Weekly seesion - 07/30/23 1100       [facility] "PREP" Location   [facility]      Weekly Session   Topic Discussed Expectations and non-scale victories   Halfway through program, staying positive, realistic goal setting, tips to stay motivated, setting permanent objectives, discuss blue zone commonalities   Minutes exercised this week 260 minutes    Classes attended to date 12             Remo Lipps 07/30/2023, 11:57 AM

## 2023-08-06 ENCOUNTER — Encounter: Payer: Self-pay | Admitting: *Deleted

## 2023-08-06 NOTE — Progress Notes (Signed)
 [employer] PRE[facility] Weekly Session  Patient Details  Name: Sylvia Kelly MRN: [id number] Date of Birth: [birth year] Age: 68 y.o. PCP: Dorothyann Peng, MD  Vitals:   08/06/23 1210  Weight: 145 lb (65.8 kg)     [facility] Weekly seesion - 08/06/23 1200       [facility] "PREP" Location   [facility]      Weekly Session   Topic Discussed Other   Portion Control, portion size visual aids, deceptive labeling.   Minutes exercised this week 120 minutes    Classes attended to date 6             Remo Lipps 08/06/2023, 12:11 PM

## 2023-08-13 ENCOUNTER — Encounter: Payer: Self-pay | Admitting: *Deleted

## 2023-08-13 NOTE — Progress Notes (Signed)
 [employer] PRE[facility] Weekly Session  Patient Details  Name: Sylvia Kelly MRN: [id number] Date of Birth: 05/07/1956 Age: 68 y.o. PCP: Dorothyann Peng, MD  Vitals:   08/13/23 1229  Weight: 145 lb (65.8 kg)     [facility] Weekly seesion - 08/13/23 1200       [facility] "PREP" Location   [facility]      Weekly Session   Topic Discussed Finding support   Accountability partners, finding internal and external support   Minutes exercised this week 195 minutes    Classes attended to date 17             Remo Lipps 08/13/2023, 12:30 PM

## 2023-08-20 ENCOUNTER — Encounter: Payer: Self-pay | Admitting: *Deleted

## 2023-08-20 NOTE — Progress Notes (Signed)
 [employer] PRE[facility] Weekly Session  Patient Details  Name: GENAVIE BOETTGER MRN: [id number] Date of Birth: 11/22/1955 Age: 68 y.o. PCP: Dorothyann Peng, MD  Vitals:   08/20/23 1241  Weight: 145 lb (65.8 kg)     [facility] Weekly seesion - 08/20/23 1200       [facility] "PREP" Location   [facility]      Weekly Session   Topic Discussed Calorie breakdown   Review macronutrients: fats, carbs, proteins. Healthy food choices, importance of protein.   Minutes exercised this week 200 minutes    Classes attended to date 35             Remo Lipps 08/20/2023, 12:42 PM

## 2023-08-27 ENCOUNTER — Encounter: Payer: Self-pay | Admitting: *Deleted

## 2023-08-27 NOTE — Progress Notes (Signed)
 [employer] PRE[facility] Weekly Session  Patient Details  Name: Sylvia Kelly MRN: [id number] Date of Birth: 03/16/1956 Age: 68 y.o. PCP: Dorothyann Peng, MD  Vitals:   08/27/23 1215  Weight: 140 lb (63.5 kg)     [facility] Weekly seesion - 08/27/23 1200       [facility] "PREP" Location   [facility]      Weekly Session   Topic Discussed Hitting roadblocks   Tips for exercise motivation, troubleshooting barriers, [employer] membership talk, assign Goals and Activity Sheet.   Minutes exercised this week 230 minutes    Classes attended to date 52             Remo Lipps 08/27/2023, 12:16 PM

## 2023-09-03 ENCOUNTER — Encounter: Payer: Self-pay | Admitting: *Deleted

## 2023-09-03 NOTE — Progress Notes (Signed)
 [facility] PREP Weekly Session  Patient Details  Name: Sylvia Kelly MRN: [id number] Date of Birth: [birth year] Age: 68 y.o. PCP: Dorothyann Peng, MD  Vitals:   09/03/23 1625  Weight: 141 lb (64 kg)     [facility] Weekly seesion - 09/03/23 1600       [facility] "PREP" Location   [facility]      Weekly Session   Topic Discussed Other   FIT Testing, Surveys, Final eduaction wrap up   Minutes exercised this week 200 minutes    Classes attended to date 22             Remo Lipps 09/03/2023, 4:26 PM

## 2023-09-06 ENCOUNTER — Telehealth: Payer: Self-pay

## 2023-09-06 NOTE — Telephone Encounter (Signed)
 PATIENT ASSISTANCE P/U  09/06/23 @ 4:30 PM   4 BOXES OZEMPIC 1X3ML  NDC [id number]

## 2023-09-10 ENCOUNTER — Encounter: Payer: Self-pay | Admitting: *Deleted

## 2023-09-10 NOTE — Progress Notes (Signed)
 [employer] PREP Evaluation  Patient Details  Name: Sylvia Kelly MRN: [id number] Date of Birth: 1956-05-13 Age: 68 y.o. PCP: Sylvia Curling, MD  Vitals:   09/10/23 1229  BP: 102/64  Pulse: 72  Resp: 20  SpO2: 99%  Weight: 139 lb 12.8 oz (63.4 kg)  Height: 5\' 3"  (1.6 m)     Past Medical History:  Diagnosis Date   Allergy    Arthritis    hip   Atrophic vaginitis    Diabetes mellitus    Elevated cholesterol    GERD (gastroesophageal reflux disease) 2009   Hypertension    Neuromuscular disorder (HCC)    neuropathy hands and feet   Past Surgical History:  Procedure Laterality Date   COLONOSCOPY     10 yrs ago- [facility] - normal per pt   OVARIAN CYST SURGERY  05/29/1990   REFRACTIVE SURGERY     TUBAL LIGATION     Social History   Tobacco Use  Smoking Status Never  Smokeless Tobacco Never    Sylvia Kelly 09/10/2023, 12:34 PM  PREP Class Completed. Attended 10 of 10 education classes and 11 of 11 exercise sessions.Improved Fit and Strong survey by 11 points. Significant improvement in FIT testing, increasing 2 minute cardio march by 28 steps and adding 2 Sit to Stands in 30 seconds ( 9 to 11). Advise to continue to work on balance exercises for improvement. Weight loss 2.6lbs. Waist/hip decrease of 1.75"/0.75" over 12 weeks. BMI now 24.7. Sylvia Kelly has contributed greatly to the camaraderie of the class. Excellent improvement in exercise tolerance. She has a good plan in place for continued success. Great work Comptroller!

## 2023-09-16 ENCOUNTER — Other Ambulatory Visit: Payer: Self-pay | Admitting: Internal Medicine

## 2023-09-20 ENCOUNTER — Telehealth: Admitting: Internal Medicine

## 2023-09-20 ENCOUNTER — Encounter: Payer: Self-pay | Admitting: Internal Medicine

## 2023-09-20 DIAGNOSIS — M79675 Pain in left toe(s): Secondary | ICD-10-CM

## 2023-09-20 DIAGNOSIS — E1140 Type 2 diabetes mellitus with diabetic neuropathy, unspecified: Secondary | ICD-10-CM | POA: Diagnosis not present

## 2023-09-20 DIAGNOSIS — Z794 Long term (current) use of insulin: Secondary | ICD-10-CM

## 2023-09-20 NOTE — Progress Notes (Signed)
 Virtual Visit via Video Note  I,[name], CMA,acting as a Neurosurgeon for Smiley Dung, MD.,have documented all relevant documentation on the behalf of Smiley Dung, MD,as directed by  Smiley Dung, MD while in the presence of Smiley Dung, MD.  I connected with Sylvia Kelly on 09/24/23 at  4:20 PM EDT by a video enabled telemedicine application and verified that I am speaking with the correct person using two identifiers.  Patient Location: Home Provider Location: Office/Clinic  I discussed the limitations, risks, security, and privacy concerns of performing an evaluation and management service by video and the availability of in person appointments. I also discussed with the patient that there may be a patient responsible charge related to this service. The patient expressed understanding and agreed to proceed.  Subjective: PCP: Cleave Curling, MD  Chief Complaint  Patient presents with   Foot Injury    Patient presents today for Foot injury a month ago. Has some swelling in toes on left foot and pain in both feet resulting from hitting toes on bottom of bed.    Discussed the use of AI scribe software for clinical note transcription with the patient, who gave verbal consent to proceed.  History of Present Illness Sylvia Kelly is a 68 year old female with diabetes who presents with persistent pain and discoloration in her left foot after an injury.  Approximately one month ago, she injured her left foot by bumping it on the bottom of a hospital bed, specifically affecting the toe next to the pinky toe and possibly the last three toes. Initially, the pain was severe, and she continues to experience persistent discomfort and swelling in the affected area. The toe next to the pinky toe is swollen and bruised, with a darker discoloration.  She has applied various topical treatments including Bengay, Aspercreme, and Icy Hot. She has not used Voltaren gel recently,  although she has used it in the past.  Her current medications include atorvastatin  80 mg, Farxiga , Zetia  10 mg daily, Tresiba  26 units at night, Ozempic  1 mg weekly, and lisinopril  10/25 mg. She uses Flonase  as needed and takes Pepcid  occasionally for indigestion.  She monitors her blood sugar levels regularly, reporting good control with readings typically around 89 to 95, with occasional higher readings such as 155 over a holiday weekend.    ROS: Per HPI  Current Outpatient Medications:    aspirin 81 MG tablet, Take 81 mg by mouth daily., Disp: , Rfl:    atorvastatin  (LIPITOR) 80 MG tablet, TAKE 1 TABLET BY MOUTH EVERY DAY, Disp: 90 tablet, Rfl: 1   Cholecalciferol (VITAMIN D3) 50 MCG (2000 UT) capsule, Take 2,000 Units by mouth daily., Disp: , Rfl:    dapagliflozin  propanediol (FARXIGA ) 10 MG TABS tablet, Take by mouth daily., Disp: , Rfl:    ezetimibe  (ZETIA ) 10 MG tablet, Take 1 tablet (10 mg total) by mouth daily., Disp: 90 tablet, Rfl: 3   famotidine  (PEPCID ) 10 MG tablet, Take 10 mg by mouth 2 (two) times daily., Disp: , Rfl:    fluticasone  (FLONASE ) 50 MCG/ACT nasal spray, SPRAY 1 SPRAY INTO EACH NOSTRIL EVERY DAY, Disp: 24 mL, Rfl: 1   insulin  degludec (TRESIBA  FLEXTOUCH) 200 UNIT/ML FlexTouch Pen, Inject 26 Units into the skin at bedtime., Disp: , Rfl:    Insulin  Pen Needle (B-D UF III MINI PEN NEEDLES) 31G X 5 MM MISC, USE AS DIRECTED WITH LEVEMIR, Disp: 150 each, Rfl: 3  lisinopril -hydrochlorothiazide  (ZESTORETIC ) 10-12.5 MG tablet, Take 1 tablet by mouth daily., Disp: 90 tablet, Rfl: 3   metFORMIN  (GLUCOPHAGE -XR) 500 MG 24 hr tablet, Take 1 tablet (500 mg total) by mouth 2 (two) times daily with a meal., Disp: 180 tablet, Rfl: 3   Multiple Vitamin (MULTIVITAMIN) tablet, Take 1 tablet by mouth daily., Disp: , Rfl:    OZEMPIC , 1 MG/DOSE, 4 MG/3ML SOPN, INJECT 1MG  SUBCUTANEOUSLY  ONCE A WEEK, Disp: 3 mL, Rfl: 2   triamcinolone  cream (KENALOG ) 0.1 %, APPLY TO AFFECTED AREA TWICE  DAILY AS NEEDED, Disp: 30 g, Rfl: 1  Current Facility-Administered Medications:    dextrose  5 % solution, , Intravenous, Continuous, Nandigam, Kavitha V, MD  Observations/Objective: There were no vitals filed for this visit. Physical Exam Vitals and nursing note reviewed.  Constitutional:      Appearance: Normal appearance.  HENT:     Head: Normocephalic and atraumatic.  Eyes:     Extraocular Movements: Extraocular movements intact.  Pulmonary:     Effort: Pulmonary effort is normal.  Musculoskeletal:     Cervical back: Normal range of motion.  Skin:    General: Skin is warm.  Neurological:     General: No focal deficit present.     Mental Status: She is alert.  Psychiatric:        Mood and Affect: Mood normal.        Behavior: Behavior normal.     Assessment and Plan: Pain in left toe(s) Assessment & Plan: Persistent pain and swelling in the last three toes with discoloration. Topical analgesics ineffective. - Apply Voltaren gel to the affected area for 3-4 days. - Refer to podiatry for further evaluation and possible x-ray.  Orders: -     Ambulatory referral to Podiatry  Type 2 diabetes mellitus with diabetic neuropathy, with long-term current use of insulin  (HCC) Assessment & Plan: Well-controlled with current medication regimen. Blood sugar levels consistently within target range. - Continue current diabetes medications: Farxiga , Tresiba  26 units at night, Ozempic  1 mg weekly, and metformin . - Monitor blood sugar levels regularly and maintain a record. - Schedule diabetes check in June.    Follow Up Instructions: Return if symptoms worsen or fail to improve.   I discussed the assessment and treatment plan with the patient. The patient was provided an opportunity to ask questions, and all were answered. The patient agreed with the plan and demonstrated an understanding of the instructions.   The patient was advised to call back or seek an in-person evaluation  if the symptoms worsen or if the condition fails to improve as anticipated.  The above assessment and management plan was discussed with the patient. The patient verbalized understanding of and has agreed to the management plan.   I, Smiley Dung, MD, have reviewed all documentation for this visit. The documentation on 09/20/23 for the exam, diagnosis, procedures, and orders are all accurate and complete.

## 2023-09-24 DIAGNOSIS — M79675 Pain in left toe(s): Secondary | ICD-10-CM | POA: Insufficient documentation

## 2023-09-24 DIAGNOSIS — G8929 Other chronic pain: Secondary | ICD-10-CM | POA: Insufficient documentation

## 2023-09-24 NOTE — Assessment & Plan Note (Signed)
 Well-controlled with current medication regimen. Blood sugar levels consistently within target range. - Continue current diabetes medications: Farxiga , Tresiba  26 units at night, Ozempic  1 mg weekly, and metformin . - Monitor blood sugar levels regularly and maintain a record. - Schedule diabetes check in June.

## 2023-09-24 NOTE — Assessment & Plan Note (Signed)
 Persistent pain and swelling in the last three toes with discoloration. Topical analgesics ineffective. - Apply Voltaren gel to the affected area for 3-4 days. - Refer to podiatry for further evaluation and possible x-ray.

## 2023-09-28 ENCOUNTER — Ambulatory Visit (INDEPENDENT_AMBULATORY_CARE_PROVIDER_SITE_OTHER): Admitting: Podiatry

## 2023-09-28 ENCOUNTER — Ambulatory Visit (INDEPENDENT_AMBULATORY_CARE_PROVIDER_SITE_OTHER)

## 2023-09-28 DIAGNOSIS — S90122A Contusion of left lesser toe(s) without damage to nail, initial encounter: Secondary | ICD-10-CM | POA: Diagnosis not present

## 2023-09-28 DIAGNOSIS — M7752 Other enthesopathy of left foot: Secondary | ICD-10-CM

## 2023-09-28 NOTE — Patient Instructions (Signed)
 Toe Fracture  A toe fracture is a break in one of the toe bones (phalanges). What are the causes? A toe fracture may happen if you: Drop a heavy object on your toe. Stub your toe. Twist your toe. Exercise the same way too much. What increases the risk? Playing contact sports. Having weak bones (osteoporosis). Having a low calcium level. What are the signs or symptoms? The main symptoms are swelling and pain in the toe. You may also have: Bruising. Stiffness. Loss of feeling (numbness). A change in the way the toe looks. Broken bones that poke through the skin. Blood under the toenail. How is this treated? Treatments may include: Taping the broken toe to a toe that is next to it (buddy taping). Wearing a shoe that has a wide, rigid sole to protect the toe and to limit its movement. Wearing a cast. A procedure to move the toe back into place. Surgery. This may be needed if: Pieces of broken bone are out of place. The bone pokes through the skin. Physical therapy exercises to help your toe move better and get stronger. Follow these instructions at home: If you have a shoe that can be taken off: Wear the shoe as told by your doctor. Take it off only as told by your doctor. Check the skin around the shoe every day. Tell your doctor if you see problems. Loosen the shoe if your toes: Tingle. Become numb. Turn cold and blue. Keep the shoe clean and dry. If you have a cast that cannot be taken off: Do not put pressure on any part of the cast until it is fully hardened. Do not stick anything inside the cast to scratch your skin. Check the skin around the cast every day. Tell your doctor if you see problems. You may put lotion on dry skin around the cast. Do not put lotion on the skin under the cast. Keep the cast clean and dry. Bathing Do not take baths, swim, or use a hot tub. Ask your doctor about taking showers. If the shoe or cast is not waterproof: Do not let it get  wet. Cover it with a watertight covering when you take a bath or shower. Activity Use crutches to support your body weight. Do not use your injured foot to support your body weight until your doctor says that you can. Ask your doctor what activities are safe for you during recovery. Avoid activities as told by your doctor. Do exercises as told by your doctor. Driving Ask your doctor if you should avoid driving or using machines while you are taking your medicine. Do not drive while wearing a cast on a foot that you use for driving. Managing pain, stiffness, and swelling  If told, put ice on the injured area. If you have a removable shoe, take it off as told by your doctor. Put ice in a plastic bag. Place a towel between your skin and the bag or between your cast and the bag. Leave the ice on for 20 minutes, 2-3 times a day. If your skin turns bright red, take off the ice right away to prevent skin damage. The risk of damage is higher if you cannot feel pain, heat, or cold. Raise the injured area above the level of your heart while you are sitting or lying down. General instructions If your toe was taped to a toe that is next to it, follow your doctor's instructions for changing the gauze and tape. Change it more often if:  The gauze and tape get wet. If this happens, dry the space between the toes. The gauze and tape are too tight and they cause your toe to become pale or to lose feeling (go numb). If your doctor did not give you a protective shoe, wear sturdy shoes that support your foot. Your shoes should not: Pinch your toes. Fit tightly against your toes. Do not smoke or use any products that contain nicotine or tobacco. These can make it take longer for your bones to heal. If you need help quitting, ask your doctor. Take over-the-counter and prescription medicines only as told by your doctor. Keep all follow-up visits. Your doctor will check your foot to see how it is  healing. Contact a doctor if: Your pain medicine is not helping. You have a fever. You notice a bad smell coming from your cast. Get help right away if: You have numbness in your toe or foot, and it is getting worse. Your toe or your foot tingles. Your toe or your foot gets cold or turns blue. You have redness or swelling in your toe or foot, and it is getting worse. You have very bad pain. This information is not intended to replace advice given to you by your health care provider. Make sure you discuss any questions you have with your health care provider. Document Revised: 05/30/2022 Document Reviewed: 05/30/2022 Elsevier Patient Education  2024 [employer].

## 2023-10-02 NOTE — Progress Notes (Signed)
  Subjective:  Patient ID: Sylvia Kelly, female    DOB: 08/29/1955,  MRN: [id number]  Chief Complaint  Patient presents with   Foot Injury    RM#13 Patient states injured foot 6 weeks ago still experiencing pain has tried several things at home for relief with no response.    Discussed the use of AI scribe software for clinical note transcription with the patient, who gave verbal consent to proceed.  History of Present Illness Sylvia Kelly is a 68 year old female with diabetes who presents with toe pain following an injury six weeks ago.  Six weeks ago, she injured her third and fourth toes by hitting them on the edge of a bed. Initially, the fourth toe was swollen, but the swelling has decreased. Pain was significant enough to affect shoe wear but has improved. Pain persists in the third and fourth toes, with no pain in the big toe, second toe, fifth toe, or metatarsal bones. No open wounds are present.  She manages pain with Tylenol , including Tylenol  Arthritis, as she cannot take ibuprofen  due to diabetes. She has used alcohol, muscle cream, and CBD cream for self-treatment. She has not tried taping or immobilization techniques and has not been icing the toes regularly.  Her diabetes management is relevant, with a recent A1c of 7.6. The pain has affected her ability to exercise and participate in a prep program in [place] .      Objective:    Physical Exam General: AAO x3, NAD  Dermatological: Skin is warm, dry and supple bilateral. There are no open sores, no preulcerative lesions, no rash or signs of infection present.  Vascular: Dorsalis Pedis artery and Posterior Tibial artery pedal pulses are 2/4 bilateral with immedate capillary fill time.  There is no pain with calf compression, swelling, warmth, erythema.   Neruologic: Grossly intact via light touch bilateral.   Musculoskeletal: Edema present to left fourth toe.  She has tenderness to the 3rd and 4th toes.  No pain  the other toes or along the metatarsals.  Flexor, extensor tendons intact.    No images are attached to the encounter.    Results  LABS A1c: 7.6% (07/29/2023)  RADIOLOGY Foot X-ray: On the oblique view there is likely healing fracture of the fourth toe.  No clear fracture line identified at this time.   Assessment:   1. Contusion of left lesser toe(s) without damage to nail, initial encounter      Plan:  Patient was evaluated and treated and all questions answered.  Assessment and Plan Assessment & Plan Fracture of third and fourth toes - Provide toe splinting device and demonstrate use. - Instruct on taping technique as alternative. - Advise wearing supportive shoes with limited flexibility. - Recommend icing toes several times daily post-activity. - Advise foot elevation to reduce swelling. - Continue acetaminophen  for pain. - Instruct to avoid splinting device in shower or while sleeping. - Advise follow-up if pain persists or worsens in four weeks.  Type 2 diabetes mellitus Recent A1c of 7.6%. - Continue current diabetes management plan.      Return if symptoms worsen or fail to improve.   Charity Conch DPM

## 2023-10-20 ENCOUNTER — Other Ambulatory Visit: Payer: Self-pay | Admitting: Internal Medicine

## 2023-10-30 ENCOUNTER — Other Ambulatory Visit: Payer: Self-pay | Admitting: Internal Medicine

## 2023-11-01 ENCOUNTER — Other Ambulatory Visit: Payer: Self-pay

## 2023-11-01 DIAGNOSIS — E1140 Type 2 diabetes mellitus with diabetic neuropathy, unspecified: Secondary | ICD-10-CM

## 2023-11-01 MED ORDER — BD PEN NEEDLE MINI ULTRAFINE 31G X 5 MM MISC: 3 refills | Status: AC

## 2023-11-22 ENCOUNTER — Encounter: Payer: Self-pay | Admitting: Internal Medicine

## 2023-11-22 ENCOUNTER — Ambulatory Visit: Payer: Medicare Other | Admitting: Internal Medicine

## 2023-11-22 VITALS — BP 110/70 | HR 88 | Temp 98.1°F | Ht 63.0 in | Wt 141.4 lb

## 2023-11-22 DIAGNOSIS — E7800 Pure hypercholesterolemia, unspecified: Secondary | ICD-10-CM | POA: Diagnosis not present

## 2023-11-22 DIAGNOSIS — E1142 Type 2 diabetes mellitus with diabetic polyneuropathy: Secondary | ICD-10-CM | POA: Diagnosis not present

## 2023-11-22 DIAGNOSIS — D649 Anemia, unspecified: Secondary | ICD-10-CM

## 2023-11-22 DIAGNOSIS — E2839 Other primary ovarian failure: Secondary | ICD-10-CM | POA: Diagnosis not present

## 2023-11-22 DIAGNOSIS — E1140 Type 2 diabetes mellitus with diabetic neuropathy, unspecified: Secondary | ICD-10-CM

## 2023-11-22 DIAGNOSIS — I10 Essential (primary) hypertension: Secondary | ICD-10-CM

## 2023-11-22 DIAGNOSIS — Z794 Long term (current) use of insulin: Secondary | ICD-10-CM

## 2023-11-22 NOTE — Progress Notes (Signed)
 I,Sylvia Kelly, CMA,acting as a Neurosurgeon for Sylvia LOISE Slocumb, MD.,have documented all relevant documentation on the behalf of Sylvia LOISE Slocumb, MD,as directed by  Sylvia LOISE Slocumb, MD while in the presence of Sylvia LOISE Slocumb, MD.  Subjective:  Patient ID: Sylvia Kelly , female    DOB: 04/28/1956 , 68 y.o.   MRN: [id number]  Chief Complaint  Patient presents with   Diabetes    Patient presents today for dm, bo & cholesterol follow up. She reports compliance with medications. Denies headache, chest pain & sob. She complains of left eye cloudiness. She has an appointment with ophthalmologist tomorrow at 10am.    Hypertension   Hyperlipidemia    HPI Discussed the use of AI scribe software for clinical note transcription with the patient, who gave verbal consent to proceed.  History of Present Illness [name] is a 68 year old female with diabetes who presents for a diabetes check.  Blood sugar levels have been around 120 mg/dL in the mornings, with a recent reading of 130 mg/dL attributed to a late dinner. She monitors her blood sugar using fingerstick methods, finding continuous glucose monitors like Dexcom or Libre inconvenient.  She experiences sluggishness, which she attributes to chest congestion, and uses Delsym for relief, finding it effective. She also notes a foggy sensation in her right eye that began on Monday and is improving. An appointment with her eye doctor is scheduled for tomorrow.  Current medications include Ozempic  through patient assistance, metformin  once daily, lisinopril  and hydrochlorothiazide  10/12.5 mg once daily, Tresiba  26 units, Zetia  10 mg, Farxiga  10 mg, atorvastatin  80 mg, and aspirin.  She stays indoors at work to avoid the heat and has not been spending time outside on the porch. She wants to travel up [place] but finds it too expensive and is not planning any trips soon.  Diabetes She presents for her follow-up diabetic visit. She has type 2  diabetes mellitus. Her disease course has been improving. There are no hypoglycemic associated symptoms. Pertinent negatives for diabetes include no blurred vision, no fatigue, no polydipsia, no polyphagia and no polyuria. There are no hypoglycemic complications. Diabetic complications include peripheral neuropathy. Risk factors for coronary artery disease include diabetes mellitus, dyslipidemia, sedentary lifestyle, hypertension and post-menopausal.  Hypertension This is a chronic problem. The current episode started more than 1 year ago. The problem has been gradually improving since onset. Pertinent negatives include no blurred vision or palpitations. Risk factors for coronary artery disease include diabetes mellitus, dyslipidemia and post-menopausal state. Past treatments include ACE inhibitors and diuretics. The current treatment provides moderate improvement.     Past Medical History:  Diagnosis Date   Allergy    Arthritis    hip   Atrophic vaginitis    Diabetes mellitus    Elevated cholesterol    GERD (gastroesophageal reflux disease) 2009   Hypertension    Neuromuscular disorder (HCC)    neuropathy hands and feet     Family History  Problem Relation Age of Onset   Heart disease Mother    Diabetes Mother    Cancer Father        Lung cancer   Heart disease Son    Stroke Son    Colon cancer Neg Hx    Colon polyps Neg Hx    Esophageal cancer Neg Hx    Rectal cancer Neg Hx    Stomach cancer Neg Hx      Current Outpatient Medications:    aspirin  81 MG tablet, Take 81 mg by mouth daily., Disp: , Rfl:    atorvastatin  (LIPITOR) 80 MG tablet, TAKE 1 TABLET BY MOUTH EVERY DAY, Disp: 90 tablet, Rfl: 1   Cholecalciferol (VITAMIN D3) 50 MCG (2000 UT) capsule, Take 2,000 Units by mouth daily., Disp: , Rfl:    dapagliflozin  propanediol (FARXIGA ) 10 MG TABS tablet, Take by mouth daily., Disp: , Rfl:    ezetimibe  (ZETIA ) 10 MG tablet, Take 1 tablet (10 mg total) by mouth daily., Disp:  90 tablet, Rfl: 3   famotidine  (PEPCID ) 10 MG tablet, Take 10 mg by mouth 2 (two) times daily., Disp: , Rfl:    fluticasone  (FLONASE ) 50 MCG/ACT nasal spray, SPRAY 1 SPRAY INTO EACH NOSTRIL EVERY DAY, Disp: 48 mL, Rfl: 1   insulin  degludec (TRESIBA  FLEXTOUCH) 200 UNIT/ML FlexTouch Pen, Inject 26 Units into the skin at bedtime., Disp: , Rfl:    Insulin  Pen Needle (BD PEN NEEDLE MINI ULTRAFINE) 31G X 5 MM MISC, USE AS DIRECTED WITH LEVEMIR., Disp: 100 each, Rfl: 3   lisinopril -hydrochlorothiazide  (ZESTORETIC ) 10-12.5 MG tablet, Take 1 tablet by mouth daily., Disp: 90 tablet, Rfl: 3   metFORMIN  (GLUCOPHAGE -XR) 500 MG 24 hr tablet, Take 1 tablet (500 mg total) by mouth 2 (two) times daily with a meal., Disp: 180 tablet, Rfl: 3   Multiple Vitamin (MULTIVITAMIN) tablet, Take 1 tablet by mouth daily., Disp: , Rfl:    OZEMPIC , 1 MG/DOSE, 4 MG/3ML SOPN, INJECT 1MG  SUBCUTANEOUSLY  ONCE A WEEK, Disp: 3 mL, Rfl: 2   triamcinolone  cream (KENALOG ) 0.1 %, APPLY TO AFFECTED AREA TWICE DAILY AS NEEDED, Disp: 30 g, Rfl: 1  Current Facility-Administered Medications:    dextrose  5 % solution, , Intravenous, Continuous, Nandigam, Kavitha V, MD   No Known Allergies   Review of Systems  Constitutional: Negative.  Negative for fatigue.  Eyes:  Negative for blurred vision.  Respiratory: Negative.    Cardiovascular: Negative.  Negative for palpitations.  Endocrine: Negative for polydipsia, polyphagia and polyuria.  Neurological: Negative.   Psychiatric/Behavioral: Negative.       Today's Vitals   11/22/23 1440  BP: 110/70  Pulse: 88  Temp: 98.1 F (36.7 C)  SpO2: 98%  Weight: 141 lb 6.4 oz (64.1 kg)  Height: 5' 3 (1.6 m)   Body mass index is 25.05 kg/m.  Wt Readings from Last 3 Encounters:  11/22/23 141 lb 6.4 oz (64.1 kg)  09/10/23 139 lb 12.8 oz (63.4 kg)  09/03/23 141 lb (64 kg)     Objective:  Physical Exam Vitals and nursing note reviewed.  Constitutional:      Appearance: Normal  appearance.  HENT:     Head: Normocephalic and atraumatic.  Eyes:     Extraocular Movements: Extraocular movements intact.  Cardiovascular:     Rate and Rhythm: Normal rate and regular rhythm.     Heart sounds: Normal heart sounds.  Pulmonary:     Effort: Pulmonary effort is normal.     Breath sounds: Normal breath sounds.  Musculoskeletal:     Cervical back: Normal range of motion.  Skin:    General: Skin is warm.  Neurological:     General: No focal deficit present.     Mental Status: She is alert.  Psychiatric:        Mood and Affect: Mood normal.        Behavior: Behavior normal.         Assessment And Plan:  Type 2 diabetes mellitus with diabetic  neuropathy, with long-term current use of insulin  [facility]) Assessment & Plan: Well-controlled with current medication regimen. Blood sugar levels consistently within target range.  Morning blood sugars average 120 mg/dL, recent 869 mg/dL due to late dinner. Prefers finger pricking over CGM. A1c pending for glucose control assessment. Metformin  dosing frequency needs clarification based on A1c. - Continue current diabetes medications: Farxiga , Tresiba  26 units at night, Ozempic  1 mg weekly, and metformin . - Monitor blood sugar levels regularly and maintain a record. - Check A1c to assess glucose control. - Clarify metformin  dosing based on A1c results.  Orders: -     CMP14+EGFR -     Hemoglobin A1c -     Lipid panel  Essential (primary) hypertension Assessment & Plan: Chronic, well controlled.  She will continue with lisinopril /hct 10/12.5mg  daily. She is encouraged to follow low sodium diet.    Pure hypercholesterolemia Assessment & Plan: Chronic, LDL goal is less than 70.  She will continue with atorvastatin  daily. She is encouraged to follow heart healthy lifestyle.   Orders: -     Lipid panel  Estrogen deficiency -     DG Bone Density; Future   Return in 4 months (on 03/23/2024) for 4 month dm f/u.SABRA  Patient was  given opportunity to ask questions. Patient verbalized understanding of the plan and was able to repeat key elements of the plan. All questions were answered to their satisfaction.   I, Sylvia LOISE Slocumb, MD, have reviewed all documentation for this visit. The documentation on 11/22/23 for the exam, diagnosis, procedures, and orders are all accurate and complete.  IF YOU HAVE BEEN REFERRED TO A SPECIALIST, IT MAY TAKE 1-2 WEEKS TO SCHEDULE/PROCESS THE REFERRAL. IF YOU HAVE NOT HEARD FROM US /SPECIALIST IN TWO WEEKS, PLEASE GIVE US  A CALL AT [phone] X [phone].   THE PATIENT IS ENCOURAGED TO PRACTICE SOCIAL DISTANCING DUE TO THE COVID-19 PANDEMIC.

## 2023-11-22 NOTE — Patient Instructions (Signed)

## 2023-11-23 ENCOUNTER — Other Ambulatory Visit

## 2023-11-24 LAB — CMP14+EGFR
ALT: 36 IU/L — ABNORMAL HIGH (ref 0–32)
AST: 31 IU/L (ref 0–40)
Albumin: 4.4 g/dL (ref 3.9–4.9)
Alkaline Phosphatase: 86 IU/L (ref 44–121)
BUN/Creatinine Ratio: 21 (ref 12–28)
BUN: 20 mg/dL (ref 8–27)
Bilirubin Total: 0.4 mg/dL (ref 0.0–1.2)
CO2: 25 mmol/L (ref 20–29)
Calcium: 9.3 mg/dL (ref 8.7–10.3)
Chloride: 102 mmol/L (ref 96–106)
Creatinine, Ser: 0.94 mg/dL (ref 0.57–1.00)
Globulin, Total: 2.4 g/dL (ref 1.5–4.5)
Glucose: 104 mg/dL — ABNORMAL HIGH (ref 70–99)
Potassium: 4.4 mmol/L (ref 3.5–5.2)
Sodium: 140 mmol/L (ref 134–144)
Total Protein: 6.8 g/dL (ref 6.0–8.5)
eGFR: 67 mL/min/{1.73_m2} (ref >59)

## 2023-11-24 LAB — LIPID PANEL
Chol/HDL Ratio: 2.6 ratio (ref 0.0–4.4)
Cholesterol, Total: 148 mg/dL (ref 100–199)
HDL: 58 mg/dL (ref >39)
LDL Chol Calc (NIH): 77 mg/dL (ref 0–99)
Triglycerides: 61 mg/dL (ref 0–149)
VLDL Cholesterol Cal: 13 mg/dL (ref 5–40)

## 2023-11-24 LAB — HEMOGLOBIN A1C
Est. average glucose Bld gHb Est-mCnc: 177 mg/dL
Hgb A1c MFr Bld: 7.8 % — ABNORMAL HIGH (ref 4.8–5.6)

## 2023-11-25 ENCOUNTER — Ambulatory Visit: Payer: Self-pay | Admitting: Internal Medicine

## 2023-11-25 ENCOUNTER — Other Ambulatory Visit: Payer: Self-pay | Admitting: Internal Medicine

## 2023-11-26 LAB — HM DIABETES EYE EXAM

## 2023-11-30 ENCOUNTER — Other Ambulatory Visit: Payer: Self-pay | Admitting: Internal Medicine

## 2023-12-01 NOTE — Assessment & Plan Note (Signed)
 Well-controlled with current medication regimen. Blood sugar levels consistently within target range.  Morning blood sugars average 120 mg/dL, recent 869 mg/dL due to late dinner. Prefers finger pricking over CGM. A1c pending for glucose control assessment. Metformin  dosing frequency needs clarification based on A1c. - Continue current diabetes medications: Farxiga , Tresiba  26 units at night, Ozempic  1 mg weekly, and metformin . - Monitor blood sugar levels regularly and maintain a record. - Check A1c to assess glucose control. - Clarify metformin  dosing based on A1c results.

## 2023-12-01 NOTE — Assessment & Plan Note (Signed)
Chronic, LDL goal is less than 70.  She will continue with atorvastatin daily. She is encouraged to follow heart healthy lifestyle.

## 2023-12-01 NOTE — Assessment & Plan Note (Signed)
Chronic, well controlled.  She will continue with lisinopril/hct 10/12.5mg  daily. She is encouraged to follow low sodium diet.

## 2024-02-14 ENCOUNTER — Telehealth: Payer: Self-pay

## 2024-02-14 NOTE — Telephone Encounter (Signed)
 Faxed completed reorder form for Ozempic  to [employer]

## 2024-02-22 ENCOUNTER — Telehealth: Payer: Self-pay

## 2024-02-22 NOTE — Telephone Encounter (Signed)
 Completed refill form for Ozempic  ([employer]) and faxed to provider's office for review and signature.

## 2024-02-28 ENCOUNTER — Other Ambulatory Visit: Payer: Self-pay | Admitting: Internal Medicine

## 2024-03-05 ENCOUNTER — Telehealth: Payer: Self-pay

## 2024-03-05 NOTE — Telephone Encounter (Signed)
 PAP: Patient assistance application for Farxiga  through [name] (AZ&Me) has been mailed to pt's home address on file. Provider portion of application will be faxed to provider's office.For renewal 2026.

## 2024-03-11 ENCOUNTER — Other Ambulatory Visit: Payer: Self-pay

## 2024-03-11 MED ORDER — COVID-19 MRNA VAC-TRIS(PFIZER) 30 MCG/0.3ML IM SUSY
0.3000 mL | PREFILLED_SYRINGE | Freq: Once | INTRAMUSCULAR | 0 refills | Status: AC
  Filled 2024-03-11: qty 0.3, 1d supply, fill #0

## 2024-03-11 MED ORDER — FLUZONE HIGH-DOSE 0.5 ML IM SUSY
0.5000 mL | PREFILLED_SYRINGE | Freq: Once | INTRAMUSCULAR | 0 refills | Status: AC
  Filled 2024-03-11: qty 0.5, 1d supply, fill #0

## 2024-03-26 ENCOUNTER — Encounter: Payer: Self-pay | Admitting: Pharmacist

## 2024-03-26 NOTE — Progress Notes (Signed)
   03/26/2024  Patient ID: Sylvia Kelly Staff, female   DOB: 1956-01-16, 68 y.o.   MRN: [id number]  Patient brought her portion of the AZ&Me application to the office. The forms were signed but there are some questions on the financials reported on the form.  She may need to have a LIS/Extra Help application completed but more information is necessary.  Patient was called but she did not answer the phone. HIPAA compliant message was left on her voicemail.  Plan:  Await call back from Patient. Call her back next week if I do not hear from her. Go ahead and fax the form to [facility]. The financial portion can be corrected at a later date if necessary   Kyrian Stage J. Cassey Bacigalupo, PharmD, [facility] Clinical Pharmacist [phone]

## 2024-03-27 ENCOUNTER — Encounter: Payer: Self-pay | Admitting: Internal Medicine

## 2024-03-27 ENCOUNTER — Other Ambulatory Visit (HOSPITAL_COMMUNITY): Payer: Self-pay

## 2024-03-27 ENCOUNTER — Ambulatory Visit: Admitting: Internal Medicine

## 2024-03-27 VITALS — BP 124/78 | HR 80 | Temp 98.0°F | Ht 63.0 in | Wt 142.4 lb

## 2024-03-27 DIAGNOSIS — L509 Urticaria, unspecified: Secondary | ICD-10-CM | POA: Diagnosis not present

## 2024-03-27 DIAGNOSIS — E7800 Pure hypercholesterolemia, unspecified: Secondary | ICD-10-CM | POA: Diagnosis not present

## 2024-03-27 DIAGNOSIS — M79674 Pain in right toe(s): Secondary | ICD-10-CM

## 2024-03-27 DIAGNOSIS — I10 Essential (primary) hypertension: Secondary | ICD-10-CM | POA: Diagnosis not present

## 2024-03-27 DIAGNOSIS — M79675 Pain in left toe(s): Secondary | ICD-10-CM

## 2024-03-27 DIAGNOSIS — E1140 Type 2 diabetes mellitus with diabetic neuropathy, unspecified: Secondary | ICD-10-CM | POA: Diagnosis not present

## 2024-03-27 DIAGNOSIS — Z794 Long term (current) use of insulin: Secondary | ICD-10-CM

## 2024-03-27 DIAGNOSIS — G8929 Other chronic pain: Secondary | ICD-10-CM

## 2024-03-27 MED ORDER — TRIAMCINOLONE ACETONIDE 0.1 % EX CREA: TOPICAL_CREAM | CUTANEOUS | 1 refills | Status: AC

## 2024-03-27 NOTE — Patient Instructions (Signed)

## 2024-03-27 NOTE — Telephone Encounter (Signed)
 Received Patient portion PAP application (AZ&ME) Farxiga 

## 2024-03-27 NOTE — Progress Notes (Signed)
 I,Sylvia Kelly, CMA,acting as a neurosurgeon for Sylvia LOISE Slocumb, MD.,have documented all relevant documentation on the behalf of Sylvia LOISE Slocumb, MD,as directed by  Sylvia LOISE Slocumb, MD while in the presence of Sylvia LOISE Slocumb, MD.  Subjective:  Patient ID: Sylvia Kelly , female    DOB: 03/17/1956 , 68 y.o.   MRN: [id number]  Chief Complaint  Patient presents with   Diabetes    She presents today for DM, HTN & cholesterol follow-up.  She reports compliance with meds. She denies headaches, chest pain and shortness of breath.  She states feeling dizzy only when bending over. This has been an ongoing issue for a few months.  She also reports a bite on the back of her neck. She tried putting alcohol on site which made it burn a little. She admits experiencing itchiness.    Hypertension   Hyperlipidemia    HPI Discussed the use of AI scribe software for clinical note transcription with the patient, who gave verbal consent to proceed.  History of Present Illness Sylvia Kelly is a 68 year old female with diabetes who presents for a diabetes check.  Her blood sugar levels are generally well-controlled, with readings typically in the 80s and 90s, although a recent reading was 138 due to eating late. No recent episodes of hypoglycemia, with no blood sugar levels below 70. She is on multiple medications including aspirin, atorvastatin  80 mg, Farxiga  10 mg, Zetia  10 mg, famotidine  10 mg as needed, lisinopril  10/12.5 mg, metformin  twice daily, and Ozempic  1 mg. She also uses Flonase  seasonally but has not taken it recently.  She experiences dizziness when bending down and tries to maintain hydration by drinking about three bottles of water daily.  She reports foot pain, particularly in the toes, which improved with Tylenol . She last saw a podiatrist a few months ago but did not discuss the current issue with her.  She describes a rash on the back of her neck, which she attributes to a possible  insect bite. She has been using triamcinolone  cream, which was prescribed last December, and takes Zyrtec for allergies. The rash is itchy and she has been scratching it.  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   Diabetes She presents for her follow-up diabetic visit. She has type 2 diabetes mellitus. Her disease course has been improving. Pertinent negatives for diabetes include no blurred vision, no chest pain, no fatigue, no polydipsia, no polyphagia and no polyuria. There are no hypoglycemic complications. Diabetic complications include peripheral neuropathy. Risk factors for coronary artery disease include diabetes mellitus, dyslipidemia, sedentary lifestyle, hypertension and post-menopausal.  Hypertension This is a chronic problem. The current episode started more than 1 year ago. The problem has been gradually improving since onset. Pertinent negatives include no blurred vision, chest pain, palpitations or shortness of breath. Past treatments include ACE inhibitors and diuretics. There are no compliance problems.   Hyperlipidemia This is a chronic problem. The current episode started more than 1 year ago. Exacerbating diseases include diabetes. Pertinent negatives include no chest pain or shortness of breath. Current antihyperlipidemic treatment includes statins. The current treatment provides moderate improvement of lipids. There are no compliance problems.  Risk factors for coronary artery disease include post-menopausal, hypertension, diabetes mellitus and dyslipidemia.     Past Medical  History:  Diagnosis Date   Allergy    Arthritis    hip   Atrophic vaginitis    Diabetes mellitus    Elevated cholesterol    GERD (gastroesophageal reflux disease) 2009   Hypertension    Neuromuscular disorder (HCC)     neuropathy hands and feet     Family History  Problem Relation Age of Onset   Heart disease Mother    Diabetes Mother    Cancer Father        Lung cancer   Heart disease Son    Stroke Son    Colon cancer Neg Hx    Colon polyps Neg Hx    Esophageal cancer Neg Hx    Rectal cancer Neg Hx    Stomach cancer Neg Hx      Current Outpatient Medications:    aspirin 81 MG tablet, Take 81 mg by mouth daily., Disp: , Rfl:    atorvastatin  (LIPITOR) 80 MG tablet, TAKE 1 TABLET BY MOUTH EVERY DAY, Disp: 90 tablet, Rfl: 1   Cholecalciferol (VITAMIN D3) 50 MCG (2000 UT) capsule, Take 2,000 Units by mouth daily., Disp: , Rfl:    dapagliflozin  propanediol (FARXIGA ) 10 MG TABS tablet, Take by mouth daily., Disp: , Rfl:    ezetimibe  (ZETIA ) 10 MG tablet, TAKE 1 TABLET BY MOUTH EVERY DAY, Disp: 90 tablet, Rfl: 3   famotidine  (PEPCID ) 10 MG tablet, Take 10 mg by mouth 2 (two) times daily., Disp: , Rfl:    fluticasone  (FLONASE ) 50 MCG/ACT nasal spray, SPRAY 1 SPRAY INTO EACH NOSTRIL EVERY DAY, Disp: 48 mL, Rfl: 1   insulin  degludec (TRESIBA  FLEXTOUCH) 200 UNIT/ML FlexTouch Pen, Inject 26 Units into the skin at bedtime., Disp: , Rfl:    Insulin  Pen Needle (BD PEN NEEDLE MINI ULTRAFINE) 31G X 5 MM MISC, USE AS DIRECTED WITH LEVEMIR., Disp: 100 each, Rfl: 3   lisinopril -hydrochlorothiazide  (ZESTORETIC ) 10-12.5 MG tablet, Take 1 tablet by mouth daily., Disp: 90 tablet, Rfl: 3   metFORMIN  (GLUCOPHAGE -XR) 500 MG 24 hr tablet, Take 1 tablet (500 mg total) by mouth 2 (two) times daily with a meal., Disp: 180 tablet, Rfl: 3   Multiple Vitamin (MULTIVITAMIN) tablet, Take 1 tablet by mouth daily., Disp: , Rfl:    OZEMPIC , 1 MG/DOSE, 4 MG/3ML SOPN, INJECT 1MG  SUBCUTANEOUSLY  ONCE A WEEK, Disp: 3 mL, Rfl: 2   triamcinolone  cream (KENALOG ) 0.1 %, APPLY TO AFFECTED AREA TWICE DAILY AS NEEDED, Disp: 30 g, Rfl: 1  Current Facility-Administered Medications:    dextrose  5 % solution, , Intravenous, Continuous, Nandigam,  Kavitha V, MD   No Known Allergies   Review of Systems  Constitutional: Negative.  Negative for fatigue.  Eyes:  Negative for blurred vision.  Respiratory: Negative.  Negative for shortness of breath.   Cardiovascular: Negative.  Negative for chest pain and palpitations.  Gastrointestinal: Negative.   Endocrine: Negative for polydipsia, polyphagia and polyuria.  Musculoskeletal:  Positive for arthralgias.  Skin:  Positive for rash.  Psychiatric/Behavioral: Negative.       Today's Vitals   03/27/24 1412  BP: 124/78  Pulse: 80  Temp: 98 F (36.7 C)  SpO2: 98%  Weight: 142 lb 6.4 oz (64.6 kg)  Height: 5' 3 (1.6 m)   Body mass index is 25.23 kg/m.  Wt Readings from Last 3 Encounters:  03/27/24 142 lb 6.4 oz (64.6 kg)  11/22/23 141 lb 6.4 oz (64.1 kg)  09/10/23 139 lb 12.8 oz (63.4 kg)  Objective:  Physical Exam Vitals and nursing note reviewed.  Constitutional:      Appearance: Normal appearance.  HENT:     Head: Normocephalic and atraumatic.  Eyes:     Extraocular Movements: Extraocular movements intact.  Cardiovascular:     Rate and Rhythm: Normal rate and regular rhythm.     Heart sounds: Normal heart sounds.  Pulmonary:     Effort: Pulmonary effort is normal.     Breath sounds: Normal breath sounds.  Musculoskeletal:     Cervical back: Normal range of motion.  Skin:    General: Skin is warm.     Findings: Rash present.     Comments: Raised papular rash on back of neck, o vesicular lesions noted  Neurological:     General: No focal deficit present.     Mental Status: She is alert.  Psychiatric:        Mood and Affect: Mood normal.        Behavior: Behavior normal.         Assessment And Plan:  Type 2 diabetes mellitus with diabetic neuropathy, with long-term current use of insulin  (HCC) Assessment & Plan: Chronic, sugars appear stable.  Blood sugar generally controlled with occasional elevations. A1c previously 7.8%, expected to improve. Ozempic   assistance ending, requires insurance discussion. - Check A1c level. - Encourage regular exercise. - Discuss medication coverage with insurance agent.  Orders: -     CMP14+EGFR -     Hemoglobin A1c  Essential (primary) hypertension Assessment & Plan: Chronic, well controlled.  Blood pressure well-controlled at 124/78 mmHg. No hypotension. Adequate hydration and electrolyte intake encouraged. - Continue current antihypertensive regimen, lisinopril /hydrochlorothiazide  10/12.5mg  daily - Check electrolytes.   Pure hypercholesterolemia Assessment & Plan: Chronic, LDL goal is less than 70.  She will continue with atorvastatin  daily. She is encouraged to follow heart healthy lifestyle. Managed with atorvastatin  and Zetia . - Continue current lipid-lowering therapy.   Chronic toe pain, bilateral Assessment & Plan: Intermittent toe pain, likely arthritis or footwear-related. Resolves with Tylenol . No specialist referral needed. - Advise wearing shoes with wider toe box. - Consider x-ray if pain persists.   Urticaria Assessment & Plan: Hives on neck likely allergic. Itching present. Hair may aggravate. - Take Zyrtec daily for one week. - Take Pepcid  in the morning for one week. - Apply triamcinolone  cream to affected area. - Advise wearing hair up to prevent aggravation.   Other orders -     Triamcinolone  Acetonide; APPLY TO AFFECTED AREA TWICE DAILY AS NEEDED  Dispense: 30 g; Refill: 1   Return cancel March RS appt, for 4 month dm f/u.SABRA  Patient was given opportunity to ask questions. Patient verbalized understanding of the plan and was able to repeat key elements of the plan. All questions were answered to their satisfaction.   I, Sylvia LOISE Slocumb, MD, have reviewed all documentation for this visit. The documentation on 03/27/24 for the exam, diagnosis, procedures, and orders are all accurate and complete.   IF YOU HAVE BEEN REFERRED TO A SPECIALIST, IT MAY TAKE 1-2 WEEKS TO  SCHEDULE/PROCESS THE REFERRAL. IF YOU HAVE NOT HEARD FROM US /SPECIALIST IN TWO WEEKS, PLEASE GIVE US  A CALL AT [phone].   THE PATIENT IS ENCOURAGED TO PRACTICE SOCIAL DISTANCING DUE TO THE COVID-19 PANDEMIC.

## 2024-03-28 LAB — CMP14+EGFR
ALT: 22 IU/L (ref 0–32)
AST: 24 IU/L (ref 0–40)
Albumin: 4.3 g/dL (ref 3.9–4.9)
Alkaline Phosphatase: 79 IU/L (ref 49–135)
BUN/Creatinine Ratio: 15 (ref 12–28)
BUN: 14 mg/dL (ref 8–27)
Bilirubin Total: 0.4 mg/dL (ref 0.0–1.2)
CO2: 25 mmol/L (ref 20–29)
Calcium: 9.4 mg/dL (ref 8.7–10.3)
Chloride: 98 mmol/L (ref 96–106)
Creatinine, Ser: 0.94 mg/dL (ref 0.57–1.00)
Globulin, Total: 2.4 g/dL (ref 1.5–4.5)
Glucose: 114 mg/dL — ABNORMAL HIGH (ref 70–99)
Potassium: 4.4 mmol/L (ref 3.5–5.2)
Sodium: 137 mmol/L (ref 134–144)
Total Protein: 6.7 g/dL (ref 6.0–8.5)
eGFR: 66 mL/min/1.73 (ref >59)

## 2024-03-28 LAB — HEMOGLOBIN A1C
Est. average glucose Bld gHb Est-mCnc: 183 mg/dL
Hgb A1c MFr Bld: 8 % — ABNORMAL HIGH (ref 4.8–5.6)

## 2024-03-30 DIAGNOSIS — L509 Urticaria, unspecified: Secondary | ICD-10-CM | POA: Insufficient documentation

## 2024-03-30 NOTE — Assessment & Plan Note (Signed)
 Hives on neck likely allergic. Itching present. Hair may aggravate. - Take Zyrtec daily for one week. - Take Pepcid  in the morning for one week. - Apply triamcinolone  cream to affected area. - Advise wearing hair up to prevent aggravation.

## 2024-03-30 NOTE — Assessment & Plan Note (Signed)
 Intermittent toe pain, likely arthritis or footwear-related. Resolves with Tylenol . No specialist referral needed. - Advise wearing shoes with wider toe box. - Consider x-ray if pain persists.

## 2024-03-30 NOTE — Assessment & Plan Note (Signed)
 Chronic, LDL goal is less than 70.  She will continue with atorvastatin  daily. She is encouraged to follow heart healthy lifestyle. Managed with atorvastatin  and Zetia . - Continue current lipid-lowering therapy.

## 2024-03-30 NOTE — Assessment & Plan Note (Signed)
 Chronic, well controlled.  Blood pressure well-controlled at 124/78 mmHg. No hypotension. Adequate hydration and electrolyte intake encouraged. - Continue current antihypertensive regimen, lisinopril /hydrochlorothiazide  10/12.5mg  daily - Check electrolytes.

## 2024-03-30 NOTE — Assessment & Plan Note (Signed)
 Chronic, sugars appear stable.  Blood sugar generally controlled with occasional elevations. A1c previously 7.8%, expected to improve. Ozempic  assistance ending, requires insurance discussion. - Check A1c level. - Encourage regular exercise. - Discuss medication coverage with insurance agent.

## 2024-03-31 ENCOUNTER — Ambulatory Visit: Payer: Self-pay | Admitting: Internal Medicine

## 2024-04-02 ENCOUNTER — Other Ambulatory Visit: Payer: Self-pay | Admitting: Internal Medicine

## 2024-04-08 ENCOUNTER — Telehealth: Payer: Self-pay

## 2024-04-08 NOTE — Telephone Encounter (Signed)
 PAP: Patient assistance application for Tresiba  through [employer] has been mailed to pt's home address on file. Provider portion of application will be faxed to provider's office. Patient portion e-filed.

## 2024-04-09 ENCOUNTER — Telehealth: Payer: Self-pay | Admitting: Pharmacist

## 2024-04-09 ENCOUNTER — Encounter: Payer: Self-pay | Admitting: Pharmacist

## 2024-04-09 DIAGNOSIS — E1140 Type 2 diabetes mellitus with diabetic neuropathy, unspecified: Secondary | ICD-10-CM

## 2024-04-09 NOTE — Progress Notes (Signed)
   04/09/2024  Patient ID: Sylvia Kelly Staff, female   DOB: 01-19-1956, 68 y.o.   MRN: [id number]  Patient was called to follow up on medication assistance and to talk to her about her diabetes. HIPAA identifiers were obtained. Clarification was needed for her Farxiga  renewal application.  There was a lab note about her starting Xigduo to help bring down her blood sugars. She is already been receiving Farxiga  from patient assistance. She reported stopping the metformin  last week once she was called about her appointment.    Lab Results  Component Value Date   HGBA1C 8.0 (H) 03/27/2024   HGBA1C 7.8 (H) 11/23/2023   HGBA1C 7.6 (H) 07/25/2023    In addition, the Patient clarified her household income and may now be over income for Farxiga  and Xigduo.    Her Ozempic  dose could be increased as she is now on Ozempic  1 mg weekly and could be increased to 2 mg weekly.  Patient reported changing her diet, drinking more water, and increasing how long she walks.  Will send a note to Dr. Jarold to discuss   Cassius DOROTHA Brought, PharmD, [facility] Clinical Pharmacist [phone] .

## 2024-04-09 NOTE — Progress Notes (Unsigned)
   04/09/2024  Patient ID: Sylvia Kelly Staff, female   DOB: 12-06-1955, 68 y.o.   MRN: [id number]  Provider portion of [employer]  2026 renewal form faxed to Luke Mall, CPhT with Provider signature.  Lab Results  Component Value Date   HGBA1C 8.0 (H) 03/27/2024   HGBA1C 7.8 (H) 11/23/2023   HGBA1C 7.6 (H) 07/25/2023

## 2024-04-14 LAB — OPHTHALMOLOGY REPORT-SCANNED

## 2024-04-16 ENCOUNTER — Other Ambulatory Visit: Payer: Self-pay | Admitting: Pharmacist

## 2024-04-16 DIAGNOSIS — E1140 Type 2 diabetes mellitus with diabetic neuropathy, unspecified: Secondary | ICD-10-CM

## 2024-04-16 MED ORDER — FREESTYLE LIBRE 3 PLUS SENSOR MISC
3 refills | Status: AC
Start: 2024-04-16 — End: ?

## 2024-04-16 NOTE — Progress Notes (Unsigned)
 04/16/2024 Name: Sylvia Kelly MRN: [id number] DOB: August 14, 1955  Chief Complaint  Patient presents with   Diabetes     Freestyle Libre Education    Sylvia Kelly is a 68 y.o. year old female who was referred for medication management by their primary care provider, Sylvia Medici, MD. They presented for a face to face visit today.   They were referred to the pharmacist by {[email or web address]} for assistance in managing diabetes    Subjective:  Care Team: Primary Care Provider: Jarold Medici, MD ; Next Scheduled Visit: 07/29/2023   Medication Access/Adherence  Current Pharmacy:  [id number] - [place], [place] - [address] AT [facility] [address] [place] [address] Phone: [phone] Fax: [phone]  [facility] [place], [place] - [address] [place] [address] Phone: [phone] Fax: [phone]  [facility] - [place], [place] - [facility] AT [facility] to [facility] [address] [place] [address] Phone: [phone] Fax: [phone]   Patient reports affordability concerns with their medications: Yes  Patient reports access/transportation concerns to their pharmacy: No  Patient reports adherence concerns with their medications:  No      Diabetes: Farxiga  10 mg  Tresiba  26 units at bedtime Ozempic  1 mg at bedtime Metformin -Patient got confused and threw her metformin  away in preparation of staring Xigduo.  We were going to give her samples of Xigduo XR 10/500 but we only had the 02/999 In addition, after updating the Patient's re-enrollment form for [place] for 2026, she appears to be over income.    Current glucose readings:  Just re-started Freestyle Libre 3 Plus continuous glucose monitoring system   Patient denies hypoglycemic s/sx including  dizziness, shakiness,  sweating. Patient denies hyperglycemic symptoms including  polyuria, polydipsia, polyphagia, nocturia, neuropathy, blurred vision.  Current medication access support:  Receives Tresiba , Ozempic  and Farxiga  through Patient Assistance Programming  Macrovascular and Microvascular Risk Reduction:  Statin? yes (Atorvastatin  LF 03/12/24 #90); ACEi/ARB-Lisinopril -Hydrocholorothiazide 10-12.5  Last urinary albumin/creatinine ratio:  Lab Results  Component Value Date   MICRALBCREAT <4 07/25/2023   MICRALBCREAT <8 07/06/2022   MICRALBCREAT <6 06/17/2021   MICRALBCREAT <30 11/10/2020   MICRALBCREAT 30 11/04/2019   Last eye exam:  Lab Results  Component Value Date   HMDIABEYEEXA Retinopathy (A) 11/26/2023   Last foot exam: No foot exam found Tobacco Use:  Tobacco Use: Low Risk  (03/27/2024)   Patient History    Smoking Tobacco Use: Never    Smokeless Tobacco Use: Never    Passive Exposure: Not on file     Objective:  Lab Results  Component Value Date   HGBA1C 8.0 (H) 03/27/2024    Lab Results  Component Value Date   CREATININE 0.94 03/27/2024   BUN 14 03/27/2024   NA 137 03/27/2024   K 4.4 03/27/2024   CL 98 03/27/2024   CO2 25 03/27/2024    Lab Results  Component Value Date   CHOL 148 11/23/2023   HDL 58 11/23/2023   LDLCALC 77 11/23/2023   TRIG 61 11/23/2023   CHOLHDL 2.6 11/23/2023    Medications Reviewed Today     Reviewed by Jolee Cassius JINNY, RPH (Pharmacist) on 04/16/24 at 1739  Med List Status: <None>   Medication Order Taking? Sig Documenting Provider Last Dose Status Informant  aspirin 81 MG tablet [id number] Yes Take 81 mg by mouth  daily. [provider]  Active   atorvastatin  (LIPITOR) 80 MG tablet [id number] Yes TAKE 1 TABLET BY MOUTH EVERY DAY Sylvia Medici, MD  Active   Cholecalciferol (VITAMIN D3) 50 MCG (2000 UT) capsule [id number] Yes Take 2,000 Units by mouth daily. [provider]  Active   Continuous Glucose Sensor (FREESTYLE LIBRE  3 PLUS SENSOR) MISC [id number] Yes Change sensor every 15 days. Sylvia Medici, MD  Active   dapagliflozin  propanediol (FARXIGA ) 10 MG TABS tablet [id number] Yes Take by mouth daily. GETS THROUGH PATIENT ASSISTANCE [provider]  Active   dextrose  5 % solution [id number]   Nandigam, Kavitha V, MD  Active   ezetimibe  (ZETIA ) 10 MG tablet [id number] Yes TAKE 1 TABLET BY MOUTH EVERY DAY Sylvia Medici, MD  Active   famotidine  (PEPCID ) 10 MG tablet [id number] Yes Take 10 mg by mouth 2 (two) times daily. [provider]  Active            Med Note MARZETTA, MADISON   Wed Apr 18, 2023  9:07 AM) Taking once daily as needed   fluticasone  (FLONASE ) 50 MCG/ACT nasal spray [id number] Yes SPRAY 1 SPRAY INTO EACH NOSTRIL EVERY DAY Sylvia Medici, MD  Active   insulin  degludec (TRESIBA  FLEXTOUCH) 200 UNIT/ML FlexTouch Pen [id number] Yes Inject 26 Units into the skin at bedtime. Sylvia Medici, MD  Active            Med Note MACK, CASSIUS JINNY [place] Apr 16, 2024  5:24 PM)    Insulin  Pen Needle (BD PEN NEEDLE MINI ULTRAFINE) 31G X 5 MM MISC [id number] Yes USE AS DIRECTED WITH LEVEMIR. Sylvia Medici, MD  Active   lisinopril -hydrochlorothiazide  (ZESTORETIC ) 10-12.5 MG tablet [id number] Yes Take 1 tablet by mouth daily. Sylvia Medici, MD  Active   metFORMIN  (GLUCOPHAGE -XR) 500 MG 24 hr tablet [id number] Yes Take 1 tablet (500 mg total) by mouth 2 (two) times daily with a meal. Sylvia Medici, MD  Active   Multiple Vitamin (MULTIVITAMIN) tablet [id number] Yes Take 1 tablet by mouth daily. [provider]  Active   ofloxacin (OCUFLOX) 0.3 % ophthalmic solution [id number] Yes Use as directed by Opthalmology prior to surgery. [provider]  Active   OZEMPIC , 1 MG/DOSE, 4 MG/3ML SOPN [id number] Yes INJECT 1MG  SUBCUTANEOUSLY  ONCE A WEEK Georgina Speaks, FNP  Active   prednisoLONE acetate (PRED FORTE) 1 % ophthalmic suspension [id number] Yes Use as directed by Opthalmopathy [provider]  Active    triamcinolone  cream (KENALOG ) 0.1 % [id number] Yes APPLY TO AFFECTED AREA TWICE DAILY AS NEEDED Sylvia Medici, MD  Active               03/27/2024    2:12 PM 11/22/2023    2:40 PM 09/10/2023   12:29 PM  Vitals with BMI  Height 5' 3 5' 3 5' 3  Weight 142 lbs 6 oz 141 lbs 6 oz 139 lbs 13 oz  BMI 25.23 25.05 24.77  Systolic 124 110 897  Diastolic 78 70 64  Pulse 80 88 72      Assessment/Plan:   Diabetes: - Currently uncontrolled; goal A1c <7%. Cardiorenal risk reduction is optimized.. Blood pressure is at goal <130/80. LDL is at goal.  - Reviewed long term cardiovascular and renal outcomes of uncontrolled blood sugar., Reviewed goal A1c, goal fasting, and goal 2 hour post prandial glucose. Recommended to check glucose with Freestyle Libre 3 Plus sensors--check behind with regular blood glucose monitor if needed, Reviewed dietary modifications  including decreasing sweet drinks., and increasing fiber   Follow Up Plan:

## 2024-04-17 ENCOUNTER — Telehealth: Payer: Self-pay

## 2024-04-17 NOTE — Telephone Encounter (Signed)
 Completed refill/ change order form for Ozempic  and faxed to provider office for review and signature.

## 2024-04-23 ENCOUNTER — Telehealth: Payer: Self-pay | Admitting: Pharmacist

## 2024-04-23 DIAGNOSIS — E1140 Type 2 diabetes mellitus with diabetic neuropathy, unspecified: Secondary | ICD-10-CM

## 2024-04-23 NOTE — Progress Notes (Signed)
   04/23/2024  Patient ID: Sylvia Kelly Staff, female   DOB: 27-Jun-1955, 68 y.o.   MRN: [id number]  Patient was called to follow up on blood sugars. HIPAA identifiers were obtained.  Patient started using [employer] last week.  She did not report any issues with her sensors. She did report having some hypoglycemia a few times. Her blood sugars were reviewed. She had two hypoglycemic blood sugars per the Freestyle Libreview app.  Patient reported both of these happened when she was laying down. It is suspected these were compression lows.   Hypoglycemia management was reviewed.  Patient said she checked her blood sugar with her blood glucometer after the [facility] alert and there was a 20 point difference. She was re-educated about the differences in blood glucose and interstitial fluid glucose readings.  A signed Ozempic  reorder form was faxed back to Luke Mall, CPhT.      Cassius DOROTHA Brought, PharmD, BCACP Clinical Pharmacist [phone]

## 2024-04-23 NOTE — Telephone Encounter (Signed)
 PAP: Application for Evaristo Bury has been submitted to [employer], via fax

## 2024-04-30 ENCOUNTER — Other Ambulatory Visit: Payer: Self-pay | Admitting: Internal Medicine

## 2024-04-30 DIAGNOSIS — Z1231 Encounter for screening mammogram for malignant neoplasm of breast: Secondary | ICD-10-CM

## 2024-05-07 ENCOUNTER — Ambulatory Visit: Payer: Self-pay

## 2024-05-07 ENCOUNTER — Encounter: Payer: Self-pay | Admitting: Pharmacist

## 2024-05-07 NOTE — Telephone Encounter (Signed)
 FYI Only or Action Required?: FYI only for provider: ED advised.  Patient was last seen in primary care on 03/27/2024 by Jarold Medici, MD.  Called Nurse Triage reporting Arm Pain.  Symptoms began several days ago.  Interventions attempted: OTC medications: icy hot, arnica, tylenol  and Rest, hydration, or home remedies.  Symptoms are: gradually worsening.  Triage Disposition: Go to ED Now (Notify PCP)  Patient/caregiver understands and will follow disposition?: Yes                  Copied from CRM [id number]. Topic: Clinical - Red Word Triage >> May 07, 2024 12:52 PM Sylvia Kelly wrote: Red Word that prompted transfer to Nurse Triage:  Symptoms: -crick in neck for a week or so -hard to sleep on left side -tylenol  -Icy Hot cream -pain radiates from back of left side neck  to back shoulder down to pinkie finger -left pinkie is numb, a little numbness in rest of fingers -radiates to left side of back Reason for Disposition  [1] Age > 40 AND [2] no obvious cause AND [3] pain even when not moving the arm  (Exception: Pain is clearly made worse by moving arm or bending neck.)  Answer Assessment - Initial Assessment Questions Started two days ago Neck pain--into left shoulder and into left pinky Some numbness in these fingers near pinky Using Tylenol , Icy Hot, and Arnica Patient denies difficulty breathing, fevers,    Patient is advised to call us  back if anything changes or with any further questions/concerns. Patient is advised that if anything worsens to go to the Emergency Room. Patient verbalized understanding.  Answer Assessment - Initial Assessment Questions This pain started two days ago with no known reason Patient states it is in her upper left back area as well as around the front of her left shoulder area Neck pain--into left shoulder and into left pinky Pain radiating into left shoulder and down left arm Patient states this pain is present when not  moving and she denies any known injury Using Tylenol , Icy Hot, and Arnica Patient denies difficulty breathing, fevers,  Patient states that she was concerned about what could be going on with this left arm hurting---she denies any previous cardiac history but states this is something that crossed her mind. She denies any specific chest pain or difficulty breathing at this time but does mentioned the left shoulder area being involved  She is advised that at this time the recommendation would be for her to be seen and evaluated at the Emergency Room. Patient is advised she can call us  back at any time with any further questions/concerns. Patient is agreeable with this and states she has someone who can take her. She is advised that if anything gets worse she can call 911. Patient verbalized understanding.  Protocols used: Neck Pain or Stiffness-A-AH, Arm Pain-A-AH

## 2024-05-07 NOTE — Progress Notes (Signed)
° °  05/07/2024 Name: Sylvia Kelly MRN: [id number] DOB: 1956/03/19  Chief Complaint  Patient presents with   Medication Assistance    Farxiga     Patient assistance form for Farxiga  completed and Provider signature obtained.  Completed form faxed to Luke Cecille Sola for processing and scanning.   Cassius DOROTHA Brought, PharmD, BCACP Clinical Pharmacist [phone]

## 2024-05-12 NOTE — Telephone Encounter (Signed)
 PAP: Application for Sylvia Kelly has been submitted to [name] ([employer]), via fax

## 2024-05-14 ENCOUNTER — Telehealth: Payer: Self-pay | Admitting: Pharmacist

## 2024-05-14 DIAGNOSIS — E1140 Type 2 diabetes mellitus with diabetic neuropathy, unspecified: Secondary | ICD-10-CM

## 2024-05-14 MED ORDER — FREESTYLE LIBRE 3 PLUS SENSOR MISC: 3 refills | Status: AC

## 2024-05-14 NOTE — Telephone Encounter (Signed)
 PAP: Patient assistance application for Farxiga  has been approved by PAP Companies: [employer] from 05/14/2024 to 05/28/2025. Medication should be delivered to PAP Delivery: Home. For further shipping updates, please contact [name] ([employer]) at [phone]. Patient ID is: [id number].

## 2024-05-14 NOTE — Progress Notes (Deleted)
 Patient ID: Sylvia Kelly, female   DOB: 1956/04/21, 68 y.o.   MRN: [id number]  Patient called and came by the office today stating she was not able to pick up Freestyle Libre sensors [facility]. HIPAA identifiers were obtained.  [facility] told [facility] needed a prior authorization  Her insurance was called. They said [employer] were excluded on Part D and that she needed to have the sensors filled on her Part B insurance.  Called [facility] back. They were provided with the Patient's Medicare information.  The Representative said they needed a new prescription with the Patient's diagnosis code on it.  A new prescription was sent co-signature required.  Will follow up to be sure billed correctly.

## 2024-05-14 NOTE — Progress Notes (Unsigned)
° °  05/14/2024 Name: Sylvia Kelly MRN: [id number] DOB: 1955/12/10  No chief complaint on file.   Patient called and came by the office today stating she was not able to pick up Freestyle Libre sensors from [facility]. HIPAA identifiers were obtained.  [facility] said [facility] told her she needed a prior authorization  Her insurance was called. They said [employer] were excluded on Part D and that she needed to have the sensors filled on her Part B insurance.   [facility]ed [facility] back. They were provided with the Patient's Medicare information.  The Representative said they needed a new prescription with the Patient's diagnosis code on it.   A new prescription was sent co-signature required.   Will follow up to be sure billed correctly.  [facility]--the prescription still did not bill correctly. [facility] said the rejection said the Provider's office needs to fax supporting documents to:  [phone]  Follow Up Plan:  Called [employer] and left a message Spoke with clinic staff about exploring billing on [employer]. Dr. Jarold will give the Patient samples to help while this is figured out.   Cassius DOROTHA Brought, PharmD, BCACP Clinical Pharmacist [phone]

## 2024-05-23 ENCOUNTER — Ambulatory Visit

## 2024-05-23 LAB — HM DEXA SCAN: HM Dexa Scan: NORMAL

## 2024-05-26 ENCOUNTER — Ambulatory Visit: Payer: Self-pay | Admitting: Internal Medicine

## 2024-05-26 LAB — HM MAMMOGRAPHY

## 2024-05-27 ENCOUNTER — Emergency Department (HOSPITAL_BASED_OUTPATIENT_CLINIC_OR_DEPARTMENT_OTHER)

## 2024-05-27 ENCOUNTER — Other Ambulatory Visit: Payer: Self-pay

## 2024-05-27 ENCOUNTER — Emergency Department (HOSPITAL_BASED_OUTPATIENT_CLINIC_OR_DEPARTMENT_OTHER)
Admission: EM | Admit: 2024-05-27 | Discharge: 2024-05-27 | Discharge status: Against Medical Advice | Attending: Emergency Medicine | Admitting: Emergency Medicine

## 2024-05-27 ENCOUNTER — Encounter (HOSPITAL_BASED_OUTPATIENT_CLINIC_OR_DEPARTMENT_OTHER): Payer: Self-pay | Admitting: Emergency Medicine

## 2024-05-27 DIAGNOSIS — Z5329 Procedure and treatment not carried out because of patient's decision for other reasons: Secondary | ICD-10-CM | POA: Insufficient documentation

## 2024-05-27 DIAGNOSIS — R0789 Other chest pain: Secondary | ICD-10-CM | POA: Diagnosis present

## 2024-05-27 DIAGNOSIS — W010XXA Fall on same level from slipping, tripping and stumbling without subsequent striking against object, initial encounter: Secondary | ICD-10-CM | POA: Diagnosis not present

## 2024-05-27 DIAGNOSIS — S0990XA Unspecified injury of head, initial encounter: Secondary | ICD-10-CM | POA: Insufficient documentation

## 2024-05-27 DIAGNOSIS — Z5321 Procedure and treatment not carried out due to patient leaving prior to being seen by health care provider: Secondary | ICD-10-CM

## 2024-05-27 DIAGNOSIS — M7918 Myalgia, other site: Secondary | ICD-10-CM | POA: Insufficient documentation

## 2024-05-27 DIAGNOSIS — W19XXXA Unspecified fall, initial encounter: Secondary | ICD-10-CM

## 2024-05-27 DIAGNOSIS — I10 Essential (primary) hypertension: Secondary | ICD-10-CM | POA: Diagnosis not present

## 2024-05-27 MED ORDER — METHOCARBAMOL 500 MG PO TABS
500.0000 mg | ORAL_TABLET | Freq: Once | ORAL | Status: AC
  Administered 2024-05-27: 500 mg via ORAL
  Filled 2024-05-27: qty 1

## 2024-05-27 NOTE — ED Notes (Signed)
 Patient transported to CT

## 2024-05-27 NOTE — ED Provider Notes (Signed)
 " [facility] Provider Note   CSN: [id number] Arrival date & time: 05/27/24  8094     Patient presents with: Sylvia   MACLOVIA Kelly is a 68 y.o. female.  {Add pertinent medical, surgical, social history, OB history to YEP:67052} Patient with history of diabetes, hyperlipidemia, GERD, hypertension presents today with complaints of fall. Reports that same occurred yesterday when she got home. States that she just got a new dog that is large and jumped on her when she opened the door   Fall       Prior to Admission medications  Medication Sig Start Date End Date Taking? Authorizing Provider  aspirin 81 MG tablet Take 81 mg by mouth daily.    [provider]  atorvastatin  (LIPITOR) 80 MG tablet TAKE 1 TABLET BY MOUTH EVERY DAY 12/03/23   Jarold Medici, MD  Cholecalciferol (VITAMIN D3) 50 MCG (2000 UT) capsule Take 2,000 Units by mouth daily.    [provider]  Continuous Glucose Sensor (FREESTYLE LIBRE 3 PLUS SENSOR) MISC Change sensor every 15 days. 05/14/24   Jarold Medici, MD  dapagliflozin  propanediol (FARXIGA ) 10 MG TABS tablet Take by mouth daily. GETS THROUGH PATIENT ASSISTANCE    [provider]  ezetimibe  (ZETIA ) 10 MG tablet TAKE 1 TABLET BY MOUTH EVERY DAY 02/28/24   Jarold Medici, MD  famotidine  (PEPCID ) 10 MG tablet Take 10 mg by mouth 2 (two) times daily.    [provider]  fluticasone  (FLONASE ) 50 MCG/ACT nasal spray SPRAY 1 SPRAY INTO EACH NOSTRIL EVERY DAY 04/02/24   Jarold Medici, MD  insulin  degludec (TRESIBA  FLEXTOUCH) 200 UNIT/ML FlexTouch Pen Inject 26 Units into the skin at bedtime. 04/18/23   Jarold Medici, MD  Insulin  Pen Needle (BD PEN NEEDLE MINI ULTRAFINE) 31G X 5 MM MISC USE AS DIRECTED WITH LEVEMIR. 11/01/23   Jarold Medici, MD  lisinopril -hydrochlorothiazide  (ZESTORETIC ) 10-12.5 MG tablet Take 1 tablet by mouth daily. 07/25/23   Jarold Medici, MD  metFORMIN   (GLUCOPHAGE -XR) 500 MG 24 hr tablet Take 1 tablet (500 mg total) by mouth 2 (two) times daily with a meal. 06/04/23   Jarold Medici, MD  Multiple Vitamin (MULTIVITAMIN) tablet Take 1 tablet by mouth daily.    [provider]  ofloxacin (OCUFLOX) 0.3 % ophthalmic solution Use as directed by Opthalmology prior to surgery. 04/08/24   [provider]  OZEMPIC , 1 MG/DOSE, 4 MG/3ML SOPN INJECT 1MG  SUBCUTANEOUSLY  ONCE A WEEK 02/13/23   Georgina Speaks, FNP  prednisoLONE acetate (PRED FORTE) 1 % ophthalmic suspension Use as directed by Opthalmopathy 04/08/24   [provider]  triamcinolone  cream (KENALOG ) 0.1 % APPLY TO AFFECTED AREA TWICE DAILY AS NEEDED 03/27/24   Jarold Medici, MD    Allergies: Patient has no known allergies.    Review of Systems  Updated Vital Signs BP 107/69 (BP Location: Right Arm)   Pulse 88   Temp 98.4 F (36.9 C)   Resp 16   Ht 5' 3 (1.6 m)   Wt 59.9 kg   SpO2 100%   BMI 23.38 kg/m   Physical Exam  (all labs ordered are listed, but only abnormal results are displayed) Labs Reviewed - No data to display  EKG: None  Radiology: CT Cervical Spine Wo Contrast Result Date: 05/27/2024 EXAM: CT CERVICAL SPINE WITHOUT CONTRAST 05/27/2024 09:15:41 PM TECHNIQUE: CT of the cervical spine was performed without the administration of intravenous contrast. Multiplanar reformatted images are provided for review. Automated  exposure control, iterative reconstruction, and/or weight based adjustment of the mA/kV was utilized to reduce the radiation dose to as low as reasonably achievable. COMPARISON: 08/23/2006 CLINICAL HISTORY: Neck trauma (Age >= 65y) FINDINGS: BONES AND ALIGNMENT: Straightening of the normal cervical lordosis. Trace degenerative retrolisthesis of C4 on C5. No evidence of traumatic malalignment. No acute fracture. DEGENERATIVE CHANGES: Disc space narrowing most pronounced at C4-C5. Disc bulges at multiple levels. Moderate osseous spinal  canal stenosis at the C4-C5 level. Facet arthrosis and uncovertebral hypertrophy throughout the cervical spine. Foraminal stenosis at multiple levels most pronounced at C4-C5. SOFT TISSUES: No prevertebral soft tissue swelling. Multiple slightly hyperattenuating subcentimeter nodules within the thyroid . Atherosclerosis at the bilateral carotid bifurcations. IMPRESSION: 1. No evidence of acute traumatic injury. Electronically signed by: Donnice Mania MD 05/27/2024 09:56 PM EST RP Workstation: [id number]   CT Head Wo Contrast Result Date: 05/27/2024 EXAM: CT HEAD WITHOUT CONTRAST 05/27/2024 09:15:41 PM TECHNIQUE: CT of the head was performed without the administration of intravenous contrast. Automated exposure control, iterative reconstruction, and/or weight based adjustment of the mA/kV was utilized to reduce the radiation dose to as low as reasonably achievable. COMPARISON: 12/19/2017 CLINICAL HISTORY: Head trauma, minor (Age >= 65y) FINDINGS: BRAIN AND VENTRICLES: No acute hemorrhage. No evidence of acute infarct. No hydrocephalus. No extra-axial collection. No mass effect or midline shift. Mild atherosclerosis of the carotid siphons. ORBITS: Bilateral lens replacement. No acute abnormality. SINUSES: No acute abnormality. SOFT TISSUES AND SKULL: No acute soft tissue abnormality. No skull fracture. IMPRESSION: 1. No acute intracranial abnormality. Electronically signed by: Donnice Mania MD 05/27/2024 09:50 PM EST RP Workstation: [id number]    {Document cardiac monitor, telemetry assessment procedure when appropriate:32947} Procedures   Medications Ordered in the ED  methocarbamol  (ROBAXIN ) tablet 500 mg (500 mg Oral Given 05/27/24 2117)      {Click here for ABCD2, HEART and other calculators REFRESH Note before signing:1}                              Medical Decision Making Amount and/or Complexity of Data Reviewed Radiology: ordered.  Risk Prescription drug management.   ***  {Document  critical care time when appropriate  Document review of labs and clinical decision tools ie CHADS2VASC2, etc  Document your independent review of radiology images and any outside records  Document your discussion with family members, caretakers and with consultants  Document social determinants of health affecting pt's care  Document your decision making why or why not admission, treatments were needed:32947:::1}   Final diagnoses:  None    ED Discharge Orders     None        "

## 2024-05-27 NOTE — ED Triage Notes (Signed)
 Pt reports mechanical fall yesterday, slipped on tree skirt, landed on floor, landed on back of head and R side.   Denies LOC, n/v/blurred vision. C/o increased stiffness today.   Took tylenol  appx 1400 today.

## 2024-05-28 NOTE — Telephone Encounter (Signed)
 PAP: Patient assistance application for Tresiba  has been approved by PAP Companies: [employer] from 05/29/2024 to 05/28/2025. Medication should be delivered to PAP Delivery: Provider's office. For further shipping updates, please contact [employer] at [phone]. Patient ID is: not provided.

## 2024-05-31 ENCOUNTER — Other Ambulatory Visit: Payer: Self-pay | Admitting: Internal Medicine

## 2024-06-03 ENCOUNTER — Encounter: Payer: Self-pay | Admitting: Internal Medicine

## 2024-06-04 ENCOUNTER — Telehealth: Payer: Self-pay | Admitting: Pharmacist

## 2024-06-04 DIAGNOSIS — E1140 Type 2 diabetes mellitus with diabetic neuropathy, unspecified: Secondary | ICD-10-CM

## 2024-06-04 NOTE — Progress Notes (Signed)
" ° °  06/04/2024 Name: Sylvia Kelly MRN: [id number] DOB: 1956-01-19  Chief Complaint  Patient presents with   Medication Management    Freestyle Libre      Patient called to inquire about [employer]. HIPAA identifiers were obtained. Per chart review,  Freestyle [employer] were sent to her Pharmacy mid December but required a Prior Authorization. The PA was supposed to be done through  [employer] but when the clinical staff reviewed the authorizations, the Patient's did not have a determination. They resent the prior authorization today.  However, Patient said she has new insurance through [employer]. She said she would send a copy of her new card through MyChart.  Lab Results  Component Value Date   HGBA1C 8.0 (H) 03/27/2024   HGBA1C 7.8 (H) 11/23/2023   HGBA1C 7.6 (H) 07/25/2023     In the mean time, Dr. Jarold approved a Freestyle Libre sensor to be left up front for the Patient:  LOT [id number] Exp 06/28/2024  Follow up in 3-5 business days.  Cassius DOROTHA Brought, PharmD, BCACP Clinical Pharmacist [phone]  "

## 2024-06-11 ENCOUNTER — Telehealth: Payer: Self-pay

## 2024-06-12 ENCOUNTER — Telehealth: Payer: Self-pay | Admitting: Pharmacist

## 2024-06-12 ENCOUNTER — Telehealth: Payer: Self-pay

## 2024-06-12 ENCOUNTER — Other Ambulatory Visit (HOSPITAL_COMMUNITY): Payer: Self-pay

## 2024-06-12 DIAGNOSIS — E1140 Type 2 diabetes mellitus with diabetic neuropathy, unspecified: Secondary | ICD-10-CM

## 2024-06-12 NOTE — Progress Notes (Signed)
" ° °  06/12/2024 Name: MAYGEN SIRICO MRN: [id number] DOB: 11/17/1955  Chief Complaint  Patient presents with   Medication Assistance    Freestyle Herlene    SADEEL FIDDLER is a 69 y.o. year old female who presented for a telephone visit.   Purpose of today's call was to follow up on [facility] billing. Office staff had been working on a Prior Associate Professor for [employer] for the Patient. The attempts were made through [place]. A message was received back that [employer] was not contracted with the Patient's insurance.  I had Inocente Butcher, CPhT run a test claim and it was discovered that [employer] would go through with out  PA at $0 through [employer].  [facility]. The Pharmacist was walked through billing the Patient's insurance to JUST [employer]. She has another insurance through [employer] and it is not covered through that plan.  Patient was called. HIPAA identifiers were obtained.  She said she would pick up the sensors this week.   Most recent Libre report:   Cassius DOROTHA Brought, PharmD, BCACP Clinical Pharmacist [phone]   "

## 2024-06-12 NOTE — Telephone Encounter (Signed)
 Pharmacy Patient Advocate Encounter   Received notification from Physician's Office that prior authorization for Freestyle Libre 3 Plus is required/requested.   Insurance verification completed.   The patient is insured through [employer].   Per test claim: The current 90 day co-pay is, $0.  No PA needed at this time. This test claim was processed through [facility]- copay amounts may vary at other pharmacies due to pharmacy/plan contracts, or as the patient moves through the different stages of their insurance plan.

## 2024-06-24 ENCOUNTER — Telehealth: Payer: Self-pay

## 2024-06-24 NOTE — Telephone Encounter (Signed)
 I called and left patient a vm. Her patient assistance has arrived here at the office and she needs to come get it no later than Friday. YL,RMA

## 2024-07-04 ENCOUNTER — Other Ambulatory Visit: Payer: Self-pay | Admitting: Internal Medicine

## 2024-07-28 ENCOUNTER — Ambulatory Visit: Payer: Self-pay | Admitting: Internal Medicine

## 2024-08-20 ENCOUNTER — Ambulatory Visit: Payer: Medicare Other

## 2024-08-20 ENCOUNTER — Ambulatory Visit: Payer: Self-pay | Admitting: Internal Medicine
